# Patient Record
Sex: Male | Born: 1950 | Race: White | Hispanic: No | Marital: Married | Smoking: Former smoker
Health system: Southern US, Community
[De-identification: ages and names within clinical notes are randomized; demographics above are authoritative.]

## PROBLEM LIST (undated history)

## (undated) DIAGNOSIS — I10 Essential (primary) hypertension: Secondary | ICD-10-CM

## (undated) DIAGNOSIS — K219 Gastro-esophageal reflux disease without esophagitis: Secondary | ICD-10-CM

## (undated) DIAGNOSIS — Z973 Presence of spectacles and contact lenses: Secondary | ICD-10-CM

## (undated) DIAGNOSIS — C07 Malignant neoplasm of parotid gland: Secondary | ICD-10-CM

## (undated) DIAGNOSIS — C434 Malignant melanoma of scalp and neck: Secondary | ICD-10-CM

## (undated) HISTORY — PX: JOINT REPLACEMENT: SHX530

## (undated) HISTORY — PX: VARICOCELE EXCISION: SUR582

## (undated) HISTORY — PX: TONSILLECTOMY: SUR1361

## (undated) HISTORY — PX: BASAL CELL CARCINOMA EXCISION: SHX1214

## (undated) HISTORY — PX: APPENDECTOMY: SHX54

---

## 1998-06-24 ENCOUNTER — Emergency Department (HOSPITAL_COMMUNITY): Admission: EM | Admit: 1998-06-24 | Discharge: 1998-06-24 | Payer: Self-pay | Admitting: Emergency Medicine

## 1999-02-17 ENCOUNTER — Emergency Department (HOSPITAL_COMMUNITY): Admission: EM | Admit: 1999-02-17 | Discharge: 1999-02-17 | Payer: Self-pay | Admitting: Emergency Medicine

## 1999-05-24 ENCOUNTER — Ambulatory Visit (HOSPITAL_BASED_OUTPATIENT_CLINIC_OR_DEPARTMENT_OTHER): Admission: RE | Admit: 1999-05-24 | Discharge: 1999-05-24 | Payer: Self-pay | Admitting: *Deleted

## 2000-11-20 ENCOUNTER — Other Ambulatory Visit: Admission: RE | Admit: 2000-11-20 | Discharge: 2000-11-20 | Payer: Self-pay | Admitting: Family Medicine

## 2001-02-05 ENCOUNTER — Ambulatory Visit (HOSPITAL_BASED_OUTPATIENT_CLINIC_OR_DEPARTMENT_OTHER): Admission: RE | Admit: 2001-02-05 | Discharge: 2001-02-05 | Payer: Self-pay | Admitting: *Deleted

## 2001-02-05 ENCOUNTER — Encounter (INDEPENDENT_AMBULATORY_CARE_PROVIDER_SITE_OTHER): Payer: Self-pay | Admitting: Specialist

## 2002-08-17 ENCOUNTER — Ambulatory Visit (HOSPITAL_BASED_OUTPATIENT_CLINIC_OR_DEPARTMENT_OTHER): Admission: RE | Admit: 2002-08-17 | Discharge: 2002-08-17 | Payer: Self-pay | Admitting: Orthopedic Surgery

## 2004-07-11 ENCOUNTER — Emergency Department (HOSPITAL_COMMUNITY): Admission: EM | Admit: 2004-07-11 | Discharge: 2004-07-11 | Payer: Self-pay | Admitting: *Deleted

## 2004-07-30 ENCOUNTER — Ambulatory Visit (HOSPITAL_COMMUNITY): Admission: RE | Admit: 2004-07-30 | Discharge: 2004-07-30 | Payer: Self-pay | Admitting: Family Medicine

## 2004-11-20 ENCOUNTER — Encounter: Admission: RE | Admit: 2004-11-20 | Discharge: 2005-02-18 | Payer: Self-pay | Admitting: Family Medicine

## 2008-05-19 ENCOUNTER — Emergency Department (HOSPITAL_COMMUNITY): Admission: EM | Admit: 2008-05-19 | Discharge: 2008-05-20 | Payer: Self-pay | Admitting: Emergency Medicine

## 2010-03-15 ENCOUNTER — Encounter: Admission: RE | Admit: 2010-03-15 | Discharge: 2010-03-15 | Payer: Self-pay | Admitting: Neurological Surgery

## 2010-11-05 ENCOUNTER — Encounter (HOSPITAL_BASED_OUTPATIENT_CLINIC_OR_DEPARTMENT_OTHER)
Admission: RE | Admit: 2010-11-05 | Discharge: 2010-11-05 | Disposition: A | Discharge status: Home / Self Care | Payer: BC Managed Care – PPO | Source: Ambulatory Visit | Attending: Orthopedic Surgery | Admitting: Orthopedic Surgery

## 2010-11-05 DIAGNOSIS — M6530 Trigger finger, unspecified finger: Secondary | ICD-10-CM | POA: Insufficient documentation

## 2010-11-05 LAB — COMPREHENSIVE METABOLIC PANEL
ALT: 32 U/L (ref 0–53)
AST: 23 U/L (ref 0–37)
Albumin: 4.1 g/dL (ref 3.5–5.2)
Alkaline Phosphatase: 68 U/L (ref 39–117)
BUN: 27 mg/dL — ABNORMAL HIGH (ref 6–23)
CO2: 24 mEq/L (ref 19–32)
Calcium: 9.2 mg/dL (ref 8.4–10.5)
Chloride: 105 mEq/L (ref 96–112)
Creatinine, Ser: 0.92 mg/dL (ref 0.4–1.5)
GFR calc Af Amer: >60 mL/min (ref >60)
GFR calc non Af Amer: >60 mL/min (ref >60)
Glucose, Bld: 147 mg/dL — ABNORMAL HIGH (ref 70–99)
Potassium: 4.2 mEq/L (ref 3.5–5.1)
Sodium: 138 mEq/L (ref 135–145)
Total Bilirubin: 0.6 mg/dL (ref 0.3–1.2)
Total Protein: 7.2 g/dL (ref 6.0–8.3)

## 2010-11-05 LAB — LIPID PANEL
Cholesterol: 131 mg/dL (ref 0–200)
HDL: 34 mg/dL — ABNORMAL LOW (ref >39)
LDL Cholesterol: 81 mg/dL (ref 0–99)
Total CHOL/HDL Ratio: 3.9 RATIO
Triglycerides: 80 mg/dL (ref <150)
VLDL: 16 mg/dL (ref 0–40)

## 2010-11-05 LAB — HEMOGLOBIN A1C
Hgb A1c MFr Bld: 6.6 % — ABNORMAL HIGH (ref <5.7)
Mean Plasma Glucose: 143 mg/dL — ABNORMAL HIGH (ref <117)

## 2010-11-07 ENCOUNTER — Ambulatory Visit (HOSPITAL_BASED_OUTPATIENT_CLINIC_OR_DEPARTMENT_OTHER)
Admission: RE | Admit: 2010-11-07 | Discharge: 2010-11-07 | Disposition: A | Discharge status: Home / Self Care | Payer: BC Managed Care – PPO | Source: Ambulatory Visit | Attending: Orthopedic Surgery | Admitting: Orthopedic Surgery

## 2010-11-07 ENCOUNTER — Ambulatory Visit: Admission: RE | Admit: 2010-11-07 | Payer: Self-pay | Source: Home / Self Care | Admitting: Orthopedic Surgery

## 2010-11-07 DIAGNOSIS — Z01818 Encounter for other preprocedural examination: Secondary | ICD-10-CM | POA: Insufficient documentation

## 2010-11-07 DIAGNOSIS — Z01812 Encounter for preprocedural laboratory examination: Secondary | ICD-10-CM | POA: Insufficient documentation

## 2010-11-07 DIAGNOSIS — M6530 Trigger finger, unspecified finger: Secondary | ICD-10-CM | POA: Insufficient documentation

## 2010-11-07 LAB — POCT HEMOGLOBIN-HEMACUE: Hemoglobin: 14 g/dL (ref 13.0–17.0)

## 2010-11-07 LAB — GLUCOSE, CAPILLARY
Glucose-Capillary: 152 mg/dL — ABNORMAL HIGH (ref 70–99)
Glucose-Capillary: 121 mg/dL — ABNORMAL HIGH (ref 70–99)

## 2010-12-02 NOTE — Op Note (Signed)
Corey Sellers, Corey Sellers               ACCOUNT NO.:  [id number]  MEDICAL RECORD NO.:  [id number]           PATIENT TYPE:  LOCATION:                                 FACILITY:  PHYSICIAN:  Betha Loa, MD        DATE OF BIRTH:  08-25-1951  DATE OF PROCEDURE:  11/07/2010 DATE OF DISCHARGE:                              OPERATIVE REPORT   PREOPERATIVE DIAGNOSIS:  Right small finger trigger digit.  POSTOPERATIVE DIAGNOSIS:  Right small finger trigger digit.  PROCEDURE:  Right small finger trigger release.  SURGEON:  Betha Loa, MD  ASSISTANT:  None.  ANESTHESIA:  Local with sedation.  INTRAVENOUS FLUIDS:  Per anesthesia flow sheet.  ESTIMATED BLOOD LOSS:  Minimal.  COMPLICATIONS:  None.  SPECIMENS:  None.  TOURNIQUET TIME:  21 minutes.  DISPOSITION:  Stable to PACU.  INDICATIONS:  Corey Sellers is a 60 year old white male whom I have seen in the office for triggering of the right small finger.  We have injected it at least two times in the office.  He had continued discomfort with triggering of the finger.  It has improved, however, he still notes an occasional pop in the finger which is bothersome to him.  He also had some pain in the palm of the hand associated with the flexor tendon nodule.  He wished to have a trigger release to try and relieve some of this.  I discussed with him that the release would relieve the triggering but would not do anything to improve the pain with gripping right away until the nodule began to decrease in size.  He understood this and agreed.  Risks, benefits, and alternatives of surgery were discussed including the risk of blood loss, infection, damage to nerves, vessels, tendons, ligaments, bone, failure of the surgery, need for additional surgery, complications with wound healing, continued pain, continued triggering.  He voiced understanding of these risks and elected to proceed.  OPERATIVE COURSE:  After being identified preoperatively by  myself, the patient and I agreed upon the procedure and site of the procedure.  The surgery site was marked.  The risks, benefits, and alternatives of surgery were reviewed, and he wished to proceed.  Surgical consent had been signed.  He is given 1 g of IV Ancef as preoperative antibiotic prophylaxis.  He was transferred to the operating room, placed on the operating table in supine position with the right upper extremity on arm board.  Sedation was induced by the anesthesia staff.  Local anesthesia was given with a half-and-half solution of 0.25% plain Marcaine and 1% plain lidocaine.  An 8 mL total was used in the area of incision at the level of the distal palmar crease over the small finger.  The right upper extremity was then prepped and draped in normal sterile orthopedic fashion using ChloraPrep.  A surgical pause had been performed by the surgeons, Anesthesia, and operating room staff and all were in agreement with the patient, procedure, and site of the procedure.  Tourniquet at the proximal aspect of the extremity was inflated to 250 mmHg after exsanguination of the limb with  an Esmarch bandage.  Incision was made just distal to the distal palmar crease.  This was carried into the subcutaneous tissues by spreading technique.  Retractors were used to protect the radial and ulnar neurovascular bundles throughout the case. The flexor tendon sheath was easily identified.  The A1 pulley was identified.  There was no A0 pulley.  The A1 pulley was sharply incised using knife.  It was incised in its entirety.  The A2 pulley was left intact.  The FDP and FDS tendons were brought through the wound and separated.  There was noted to be no triggering with traction on the tendons.  The patient was lightened in his sedation.  The drape was dropped, and he was asked to make a tight fist and then slowly open it, no triggering was noted by myself or the patient.  The wound was then copiously  irrigated by sterile saline.  It was closed using 4-0 nylon in a horizontal mattress fashion.  The wound was then dressed with sterile Xeroform and 4x4s and wrapped with Kling and a Coban dressing lightly. The tourniquet was deflated at 21 minutes.  Fingertips were pink with brisk capillary refill after deflation of the tourniquet.  Operative drapes were broken down, and the patient was awoken from anesthesia safely.  He was transferred back to stretcher and taken to PACU in stable condition.  I will see him back in the office in 1 week for postoperative followup.  I will give him Percocet 5/325 one to two p.o. q.6 h. p.r.n. pain.     Betha Loa, MD     KK/MEDQ  D:  11/07/2010  T:  11/08/2010  Job:  [id number]  Electronically Signed by Betha Loa  on 12/02/2010 04:11:40 PM

## 2010-12-16 ENCOUNTER — Other Ambulatory Visit: Payer: Self-pay | Admitting: Family Medicine

## 2011-02-14 NOTE — Op Note (Signed)
[place]. [facility]  Patient:    Corey Sellers, Corey Sellers                        MRN: [id number] Proc. Date: 02/05/01 Attending:  Molly Maduro L. Lyman Bishop, M.D.                           Operative Report  PREOPERATIVE DIAGNOSIS:  Mass, right vocal cord.  POSTOPERATIVE DIAGNOSIS:  Mass, right vocal cord.  OPERATION PERFORMED:  Microlaryngoscopy with excision of mass, right vocal cord.  SURGEON:  Robert L. Lyman Bishop, M.D.  ANESTHESIA:  General.  INDICATIONS FOR PROCEDURE:  This patient initially presented with a history of having coughed up a mass and was seen in the office with what appeared to be a reddish, inflammatory mass on the anterior third of the right vocal cord.  The particle that he had coughed up was evaluated by pathology and showed a benign inflammatory lesion.  Treated conservatively with no improvement and persistence of the mass in the right vocal cord, so patient is admitted for removal of the lesion.  The patient is a nonsmoker.  DESCRIPTION OF PROCEDURE:   After satisfactory general endotracheal anesthesia had been induced, a protective guard was placed over the patients upper anterior teeth and the Dedo-Pilling laryngoscope was inserted into the larynx and suspended with the Lewy holder.  Examination showed a broadly based, firm reddish mass on the free margin of the anterior third, right cord.  Larynx otherwise entirely normal in appearance.  Grasping the lesion with cup forceps, it was incised with the microscissors preserving the underlying cord substance.  There was minimal bleeding and this controlled with topical epinephrine pack.  Photographs were taken of the lesion which were included in the chart.  Specimen sent to pathology.  Estimated blood loss was less than 1 cc.  The patient tolerated the procedure well, was awakened from anesthesia and taken to the recovery room in satisfactory condition. DD:  02/05/01 TD:  02/05/01 Job:  [id number]

## 2011-02-14 NOTE — Op Note (Signed)
NAME:  Corey Sellers, Corey Sellers                         ACCOUNT NO.:  [id number]   MEDICAL RECORD NO.:  [id number]                   PATIENT TYPE:  AMB   LOCATION:  DSC                                  FACILITY:  [facility]   PHYSICIAN:  John L. Rendall III, M.D.           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   OPERATION PERFORMED:  1. Arthroscopic glenohumeral debridement of partial thickness cuff tear and     degenerative labrum.  2. Arthroscopic subacromial decompression with acromioplasty and     coracoacromial ligament takedown.  3. Open distal clavicle resection.   SURGEON:  John L. Rendall, M.D.   ANESTHESIA:  General.   INDICATIONS FOR PROCEDURE:  Chronic shoulder pain resistant to conservative  measures with MRI positive for subacromial impingement with partial  thickness subscapularis degeneration and tendinitis but no obvious cuff tear  plus significant AC joint arthritis.   DESCRIPTION OF PROCEDURE:  Under general anesthesia, the patient was placed  in the left lateral decubitus position on the bean bag and the right  shoulder was prepared with DuraPrep and draped as a sterile field.  It was  suspended from a fishing pole shoulder holder in the forward flexed 35  degree and adducted 30 degree position.  After routine prep and drape,  anatomic markings were marked with a marking pen.  A posterior entry was  then made after probing with an 18 gauge needle.  Glenohumeral joint was  evaluated.  An anterior portal was then obtained by Wissinger rod technique.  Glenohumeral debridement of a degenerative labrum and partially frayed  subscapularis undersurface  and undersurface of supraspinatus was done.  Following this, attention was turned to the glenohumeral surfaces which show  very little degenerative change.  Attention was then turned to the bursa and  actual chondromalacia of the undersurface of the acromion was seen.  A very  thick bursa was encountered an anterior irrigating portal was used.  A  posterior viewing portal and a lateral working portal.  First a bursectomy  was carried out.  Following this, the architect wand was used for periosteal  resection over the acromion.  A 6 mm Linvatec bur was then used for  acromioplasty removing approximately 4 to 5 mm of the downward sloping  anterior acromion over to the [facility] joint.  The CA ligament was taken down.  Following this, minor smoothing was done with a bur.  Attention was then  turned to the [facility] joint.  Traction was let off to five pounds and 1-1/2 inch  incision was made dorsally over the [facility] joint.  By subperiosteal dissection,  the end of the clavicle  was exposed and two baby Hohmann's were placed  anteriorly and posteriorly.  An oscillating saw was then used to remove 1 cm  distal clavicle.  Bone wax was then placed over the bleeding end of the  bone.  Electrocautery was used on several small vessels.  Attention was then  turned to the undersurface of the acromion at the [facility] joint and roughness was  felt there.  A rasp was used to smooth that and once this was completed.  The periosteal sleeve was sutured with 0 Vicryl.  Subcu was  closed with 2-0 Vicryl and the skin with clips.  The arm was infiltrated  with Marcaine 0.5% with epinephrine and sterile dressing and arm sling were  applied.  The patient returned to recovery in good condition.  Will have him  return to the office for check up on Tuesday.  He was given Percocet for  pain.                                                 John L. Dorothyann Gibbs, M.D.    Renato Gails  D:  08/17/2002  T:  08/17/2002  Job:  [id number]

## 2011-09-30 HISTORY — PX: PAROTIDECTOMY W/ NECK DISSECTION TOTAL: SUR1004

## 2012-04-13 ENCOUNTER — Ambulatory Visit (HOSPITAL_BASED_OUTPATIENT_CLINIC_OR_DEPARTMENT_OTHER)
Admission: RE | Admit: 2012-04-13 | Discharge: 2012-04-13 | Disposition: A | Discharge status: Home / Self Care | Payer: BC Managed Care – PPO | Source: Ambulatory Visit | Attending: Family Medicine | Admitting: Family Medicine

## 2012-04-13 ENCOUNTER — Other Ambulatory Visit (HOSPITAL_BASED_OUTPATIENT_CLINIC_OR_DEPARTMENT_OTHER): Payer: Self-pay | Admitting: Family Medicine

## 2012-04-13 DIAGNOSIS — L989 Disorder of the skin and subcutaneous tissue, unspecified: Secondary | ICD-10-CM | POA: Insufficient documentation

## 2012-04-13 DIAGNOSIS — R599 Enlarged lymph nodes, unspecified: Secondary | ICD-10-CM | POA: Insufficient documentation

## 2012-04-13 DIAGNOSIS — R221 Localized swelling, mass and lump, neck: Secondary | ICD-10-CM | POA: Insufficient documentation

## 2012-04-13 DIAGNOSIS — M47812 Spondylosis without myelopathy or radiculopathy, cervical region: Secondary | ICD-10-CM | POA: Insufficient documentation

## 2012-04-13 DIAGNOSIS — R220 Localized swelling, mass and lump, head: Secondary | ICD-10-CM | POA: Insufficient documentation

## 2012-04-13 MED ORDER — IOHEXOL 300 MG/ML  SOLN
75.0000 mL | Freq: Once | INTRAMUSCULAR | Status: AC | PRN
  Administered 2012-04-13: 75 mL via INTRAVENOUS

## 2012-04-14 ENCOUNTER — Other Ambulatory Visit (HOSPITAL_BASED_OUTPATIENT_CLINIC_OR_DEPARTMENT_OTHER): Payer: BC Managed Care – PPO

## 2012-04-15 ENCOUNTER — Other Ambulatory Visit (HOSPITAL_COMMUNITY): Payer: Self-pay | Admitting: Family Medicine

## 2012-04-15 DIAGNOSIS — R220 Localized swelling, mass and lump, head: Secondary | ICD-10-CM

## 2012-04-16 ENCOUNTER — Other Ambulatory Visit: Payer: Self-pay | Admitting: Radiology

## 2012-04-19 ENCOUNTER — Encounter (HOSPITAL_COMMUNITY): Payer: Self-pay

## 2012-04-19 ENCOUNTER — Ambulatory Visit (HOSPITAL_COMMUNITY)
Admission: RE | Admit: 2012-04-19 | Discharge: 2012-04-19 | Disposition: A | Discharge status: Home / Self Care | Payer: BC Managed Care – PPO | Source: Ambulatory Visit | Attending: Family Medicine | Admitting: Family Medicine

## 2012-04-19 DIAGNOSIS — R599 Enlarged lymph nodes, unspecified: Secondary | ICD-10-CM | POA: Insufficient documentation

## 2012-04-19 DIAGNOSIS — K219 Gastro-esophageal reflux disease without esophagitis: Secondary | ICD-10-CM | POA: Insufficient documentation

## 2012-04-19 DIAGNOSIS — I10 Essential (primary) hypertension: Secondary | ICD-10-CM | POA: Insufficient documentation

## 2012-04-19 DIAGNOSIS — E119 Type 2 diabetes mellitus without complications: Secondary | ICD-10-CM | POA: Insufficient documentation

## 2012-04-19 DIAGNOSIS — Z85819 Personal history of malignant neoplasm of unspecified site of lip, oral cavity, and pharynx: Secondary | ICD-10-CM | POA: Insufficient documentation

## 2012-04-19 DIAGNOSIS — R220 Localized swelling, mass and lump, head: Secondary | ICD-10-CM

## 2012-04-19 HISTORY — DX: Essential (primary) hypertension: I10

## 2012-04-19 HISTORY — DX: Gastro-esophageal reflux disease without esophagitis: K21.9

## 2012-04-19 LAB — CBC
MCHC: 34.1 g/dL (ref 30.0–36.0)
Platelets: 143 10*3/uL — ABNORMAL LOW (ref 150–400)
RDW: 13.1 % (ref 11.5–15.5)
WBC: 4 10*3/uL (ref 4.0–10.5)

## 2012-04-19 LAB — PROTIME-INR: INR: 1.11 (ref 0.00–1.49)

## 2012-04-19 LAB — APTT: aPTT: 35 seconds (ref 24–37)

## 2012-04-19 MED ORDER — MIDAZOLAM HCL 2 MG/2ML IJ SOLN
INTRAMUSCULAR | Status: AC
  Filled 2012-04-19: qty 6

## 2012-04-19 MED ORDER — FENTANYL CITRATE 0.05 MG/ML IJ SOLN
INTRAMUSCULAR | Status: AC | PRN
  Administered 2012-04-19 (×2): 50 ug via INTRAVENOUS

## 2012-04-19 MED ORDER — MIDAZOLAM HCL 5 MG/5ML IJ SOLN
INTRAMUSCULAR | Status: AC | PRN
Start: 2012-04-19 — End: 2012-04-19
  Administered 2012-04-19: 2 mg via INTRAVENOUS

## 2012-04-19 MED ORDER — SODIUM CHLORIDE 0.9 % IV SOLN: Freq: Once | INTRAVENOUS | Status: DC

## 2012-04-19 MED ORDER — FENTANYL CITRATE 0.05 MG/ML IJ SOLN
INTRAMUSCULAR | Status: AC
  Filled 2012-04-19: qty 4

## 2012-04-19 NOTE — Procedures (Signed)
Technically successful US guided biopsy of enlarged left cervical lymph node.  No immediate complications.  

## 2012-04-19 NOTE — H&P (Signed)
Corey Sellers is an 61 y.o. male.   Chief Complaint: left mandibular lymph node enlargement x 2 weeks Has had basal cell removed from left chin previously Scheduled for LAN biopsy in [place] HPI: HTN; DM; GERD; hyperlipidemia  Past Medical History  Diagnosis Date  . Hypertension   . Diabetes mellitus   . GERD (gastroesophageal reflux disease)     Past Surgical History  Procedure Date  . Tonsillectomy   . Appendectomy   . Varicocele excision     left  . Joint replacement     Rt shoulder  . Basal cell carcinoma excision     left chin    History reviewed. No pertinent family history. Social History:  reports that he has quit smoking. He does not have any smokeless tobacco history on file. His alcohol and drug histories not on file.  Allergies: No Known Allergies   (Not in a hospital admission)  Results for orders placed during the hospital encounter of 04/19/12 (from the past 48 hour(s))  APTT     Status: Normal   Collection Time   04/19/12 12:40 PM      Component Value Range Comment   aPTT 35  24 - 37 seconds   CBC     Status: Abnormal   Collection Time   04/19/12 12:40 PM      Component Value Range Comment   WBC 4.0  4.0 - 10.5 K/uL    RBC 4.77  4.22 - 5.81 MIL/uL    Hemoglobin 14.0  13.0 - 17.0 g/dL    HCT 16.1  09.6 - 04.5 %    MCV 86.2  78.0 - 100.0 fL    MCH 29.4  26.0 - 34.0 pg    MCHC 34.1  30.0 - 36.0 g/dL    RDW 40.9  81.1 - 91.4 %    Platelets 143 (*) 150 - 400 K/uL   PROTIME-INR     Status: Normal   Collection Time   04/19/12 12:40 PM      Component Value Range Comment   Prothrombin Time 14.5  11.6 - 15.2 seconds    INR 1.11  0.00 - 1.49    No results found.  Review of Systems  Constitutional: Negative for fever.  HENT:       Left mandible pain  Cardiovascular: Negative for chest pain.  Gastrointestinal: Negative for nausea and vomiting.  Neurological: Negative for headaches.  Psychiatric/Behavioral: The patient is nervous/anxious.     Blood  pressure 122/72, pulse 58, temperature 97.4 F (36.3 C), temperature source Oral, resp. rate 18, height 5' 9.5" (1.765 m), weight 224 lb 6 oz (101.776 kg), SpO2 96.00%. Physical Exam  Constitutional: He is oriented to person, place, and time.  Neck:       Tongue blackish color  Cardiovascular: Normal rate, regular rhythm and normal heart sounds.   No murmur heard. Respiratory: Effort normal and breath sounds normal. He has no wheezes.  GI: Soft. Bowel sounds are normal. There is no tenderness.  Musculoskeletal: Normal range of motion.  Neurological: He is alert and oriented to person, place, and time.  Skin: Skin is warm and dry.  Psychiatric: He has a normal mood and affect. His behavior is normal. Judgment and thought content normal.     Assessment/Plan Mandibular LAN (L); x 2 weeks Previous basal cell removal - left chin Scheduled for biopsy today Pt aware of procedure benefits and risks and agreeable to proceed Consent in chart  Narek Kniss A 04/19/2012,  1:30 PM

## 2012-04-19 NOTE — ED Notes (Signed)
Transporter here to take pt to Short Stay to recover

## 2012-04-20 ENCOUNTER — Telehealth (HOSPITAL_COMMUNITY): Payer: Self-pay | Admitting: *Deleted

## 2012-04-20 NOTE — Telephone Encounter (Signed)
Post op phone call, pt doing well, no problems.  Seeing MD tomorrow.  Encouraged to call for any questions or problems.

## 2012-04-21 ENCOUNTER — Other Ambulatory Visit: Payer: Self-pay | Admitting: Otolaryngology

## 2012-04-21 DIAGNOSIS — C434 Malignant melanoma of scalp and neck: Secondary | ICD-10-CM

## 2012-04-21 LAB — GLUCOSE, CAPILLARY: Glucose-Capillary: 117 mg/dL — ABNORMAL HIGH (ref 70–99)

## 2012-04-22 ENCOUNTER — Other Ambulatory Visit (HOSPITAL_COMMUNITY): Payer: Self-pay | Admitting: Otolaryngology

## 2012-04-22 DIAGNOSIS — C434 Malignant melanoma of scalp and neck: Secondary | ICD-10-CM

## 2012-04-22 DIAGNOSIS — C439 Malignant melanoma of skin, unspecified: Secondary | ICD-10-CM

## 2012-04-27 ENCOUNTER — Encounter (HOSPITAL_COMMUNITY): Payer: Self-pay

## 2012-04-27 ENCOUNTER — Telehealth: Payer: Self-pay | Admitting: *Deleted

## 2012-04-27 ENCOUNTER — Encounter (HOSPITAL_COMMUNITY)
Admission: RE | Admit: 2012-04-27 | Discharge: 2012-04-27 | Disposition: A | Discharge status: Home / Self Care | Payer: BC Managed Care – PPO | Source: Ambulatory Visit | Attending: Otolaryngology | Admitting: Otolaryngology

## 2012-04-27 DIAGNOSIS — C439 Malignant melanoma of skin, unspecified: Secondary | ICD-10-CM | POA: Insufficient documentation

## 2012-04-27 DIAGNOSIS — R221 Localized swelling, mass and lump, neck: Secondary | ICD-10-CM | POA: Insufficient documentation

## 2012-04-27 DIAGNOSIS — E279 Disorder of adrenal gland, unspecified: Secondary | ICD-10-CM | POA: Insufficient documentation

## 2012-04-27 DIAGNOSIS — R220 Localized swelling, mass and lump, head: Secondary | ICD-10-CM | POA: Insufficient documentation

## 2012-04-27 HISTORY — DX: Malignant melanoma of scalp and neck: C43.4

## 2012-04-27 MED ORDER — FLUDEOXYGLUCOSE F - 18 (FDG) INJECTION
18.4000 | Freq: Once | INTRAVENOUS | Status: AC | PRN
  Administered 2012-04-27: 18.4 via INTRAVENOUS

## 2012-04-27 NOTE — Telephone Encounter (Signed)
Patient stated he will not be coming here he will be going to see Dr. Jonny Ruiz Salvage in [place] on 04-27-2012 at 3:00pm

## 2012-08-12 ENCOUNTER — Other Ambulatory Visit: Payer: Self-pay | Admitting: Otolaryngology

## 2013-05-17 ENCOUNTER — Encounter (HOSPITAL_BASED_OUTPATIENT_CLINIC_OR_DEPARTMENT_OTHER): Payer: Self-pay | Admitting: *Deleted

## 2013-05-17 ENCOUNTER — Encounter (HOSPITAL_BASED_OUTPATIENT_CLINIC_OR_DEPARTMENT_OTHER)
Admission: RE | Admit: 2013-05-17 | Discharge: 2013-05-17 | Disposition: A | Discharge status: Home / Self Care | Payer: BC Managed Care – PPO | Source: Ambulatory Visit | Attending: Orthopedic Surgery | Admitting: Orthopedic Surgery

## 2013-05-17 ENCOUNTER — Other Ambulatory Visit: Payer: Self-pay

## 2013-05-17 ENCOUNTER — Other Ambulatory Visit: Payer: Self-pay | Admitting: Orthopedic Surgery

## 2013-05-17 DIAGNOSIS — Z01810 Encounter for preprocedural cardiovascular examination: Secondary | ICD-10-CM | POA: Insufficient documentation

## 2013-05-17 DIAGNOSIS — Z01818 Encounter for other preprocedural examination: Secondary | ICD-10-CM | POA: Insufficient documentation

## 2013-05-17 NOTE — Progress Notes (Signed)
05/17/13 1018  OBSTRUCTIVE SLEEP APNEA  Have you ever been diagnosed with sleep apnea through a sleep study? No  Do you snore loudly (loud enough to be heard through closed doors)?  1  Do you often feel tired, fatigued, or sleepy during the daytime? 0  Has anyone observed you stop breathing during your sleep? 0  Do you have, or are you being treated for high blood pressure? 1  BMI more than 35 kg/m2? 0  Age over 61 years old? 1  Gender: 1  Obstructive Sleep Apnea Score 4  Score 4 or greater  Results sent to PCP   

## 2013-05-17 NOTE — Progress Notes (Signed)
05/17/13 1018  OBSTRUCTIVE SLEEP APNEA  Have you ever been diagnosed with sleep apnea through a sleep study? No  Do you snore loudly (loud enough to be heard through closed doors)?  1  Do you often feel tired, fatigued, or sleepy during the daytime? 0  Has anyone observed you stop breathing during your sleep? 0  Do you have, or are you being treated for high blood pressure? 1  BMI more than 35 kg/m2? 0  Age over 61 years old? 1  Gender: 1  Obstructive Sleep Apnea Score 4  Score 4 or greater  Results sent to PCP

## 2013-05-17 NOTE — Progress Notes (Signed)
Pt had a parotidectomy 2013 [facility]-will come in for new ekg-had labs pcp 05/11/13

## 2013-05-23 ENCOUNTER — Encounter (HOSPITAL_BASED_OUTPATIENT_CLINIC_OR_DEPARTMENT_OTHER): Payer: Self-pay | Admitting: Certified Registered Nurse Anesthetist

## 2013-05-23 ENCOUNTER — Ambulatory Visit (HOSPITAL_BASED_OUTPATIENT_CLINIC_OR_DEPARTMENT_OTHER)
Admission: RE | Admit: 2013-05-23 | Discharge: 2013-05-23 | Disposition: A | Discharge status: Home / Self Care | Payer: BC Managed Care – PPO | Source: Ambulatory Visit | Attending: Orthopedic Surgery | Admitting: Orthopedic Surgery

## 2013-05-23 ENCOUNTER — Ambulatory Visit (HOSPITAL_BASED_OUTPATIENT_CLINIC_OR_DEPARTMENT_OTHER): Payer: BC Managed Care – PPO | Admitting: Certified Registered Nurse Anesthetist

## 2013-05-23 ENCOUNTER — Encounter (HOSPITAL_BASED_OUTPATIENT_CLINIC_OR_DEPARTMENT_OTHER)
Admission: RE | Disposition: A | Discharge status: Home / Self Care | Payer: Self-pay | Source: Ambulatory Visit | Attending: Orthopedic Surgery

## 2013-05-23 DIAGNOSIS — M7130 Other bursal cyst, unspecified site: Secondary | ICD-10-CM | POA: Insufficient documentation

## 2013-05-23 DIAGNOSIS — M6530 Trigger finger, unspecified finger: Secondary | ICD-10-CM | POA: Insufficient documentation

## 2013-05-23 HISTORY — PX: EXCISION METACARPAL MASS: SHX6372

## 2013-05-23 HISTORY — DX: Malignant neoplasm of parotid gland: C07

## 2013-05-23 HISTORY — PX: TRIGGER FINGER RELEASE: SHX641

## 2013-05-23 HISTORY — DX: Presence of spectacles and contact lenses: Z97.3

## 2013-05-23 SURGERY — RELEASE, A1 PULLEY, FOR TRIGGER FINGER
Anesthesia: General | Site: Hand | Laterality: Right | Wound class: Clean

## 2013-05-23 MED ORDER — FENTANYL CITRATE 0.05 MG/ML IJ SOLN
INTRAMUSCULAR | Status: DC | PRN
  Administered 2013-05-23: 25 ug via INTRAVENOUS
  Administered 2013-05-23: 50 ug via INTRAVENOUS
  Administered 2013-05-23: 25 ug via INTRAVENOUS
  Administered 2013-05-23: 50 ug via INTRAVENOUS

## 2013-05-23 MED ORDER — OXYCODONE HCL 5 MG PO TABS: 5.0000 mg | ORAL_TABLET | Freq: Once | ORAL | Status: DC | PRN

## 2013-05-23 MED ORDER — FENTANYL CITRATE 0.05 MG/ML IJ SOLN
50.0000 ug | INTRAMUSCULAR | Status: DC | PRN
Start: 2013-05-23 — End: 2013-05-23

## 2013-05-23 MED ORDER — MIDAZOLAM HCL 5 MG/5ML IJ SOLN
INTRAMUSCULAR | Status: DC | PRN
  Administered 2013-05-23: 1 mg via INTRAVENOUS

## 2013-05-23 MED ORDER — CHLORHEXIDINE GLUCONATE 4 % EX LIQD: 60.0000 mL | Freq: Once | CUTANEOUS | Status: DC

## 2013-05-23 MED ORDER — LIDOCAINE HCL (CARDIAC) 20 MG/ML IV SOLN
INTRAVENOUS | Status: DC | PRN
  Administered 2013-05-23: 60 mg via INTRAVENOUS

## 2013-05-23 MED ORDER — HYDROCODONE-ACETAMINOPHEN 5-325 MG PO TABS: ORAL_TABLET | ORAL | Status: DC

## 2013-05-23 MED ORDER — CEFAZOLIN SODIUM-DEXTROSE 2-3 GM-% IV SOLR
2.0000 g | INTRAVENOUS | Status: AC
  Administered 2013-05-23: 2 g via INTRAVENOUS

## 2013-05-23 MED ORDER — MIDAZOLAM HCL 2 MG/2ML IJ SOLN: 1.0000 mg | INTRAMUSCULAR | Status: DC | PRN

## 2013-05-23 MED ORDER — OXYCODONE HCL 5 MG/5ML PO SOLN: 5.0000 mg | Freq: Once | ORAL | Status: DC | PRN

## 2013-05-23 MED ORDER — HYDROMORPHONE HCL PF 1 MG/ML IJ SOLN: 0.2500 mg | INTRAMUSCULAR | Status: DC | PRN

## 2013-05-23 MED ORDER — PROPOFOL 10 MG/ML IV BOLUS
INTRAVENOUS | Status: DC | PRN
  Administered 2013-05-23: 200 mg via INTRAVENOUS

## 2013-05-23 MED ORDER — BUPIVACAINE HCL (PF) 0.25 % IJ SOLN
INTRAMUSCULAR | Status: DC | PRN
  Administered 2013-05-23: 10 mL

## 2013-05-23 MED ORDER — LACTATED RINGERS IV SOLN
INTRAVENOUS | Status: DC
  Administered 2013-05-23 (×2): via INTRAVENOUS

## 2013-05-23 MED ORDER — DEXAMETHASONE SODIUM PHOSPHATE 10 MG/ML IJ SOLN
INTRAMUSCULAR | Status: DC | PRN
  Administered 2013-05-23: 4 mg via INTRAVENOUS

## 2013-05-23 MED ORDER — ONDANSETRON HCL 4 MG/2ML IJ SOLN
INTRAMUSCULAR | Status: DC | PRN
  Administered 2013-05-23: 4 mg via INTRAVENOUS

## 2013-05-23 SURGICAL SUPPLY — 49 items
BANDAGE COBAN STERILE 2 (GAUZE/BANDAGES/DRESSINGS) ×2 IMPLANT
BANDAGE CONFORM 2  STR LF (GAUZE/BANDAGES/DRESSINGS) IMPLANT
BANDAGE ELASTIC 3 VELCRO ST LF (GAUZE/BANDAGES/DRESSINGS) IMPLANT
BANDAGE GAUZE ELAST BULKY 4 IN (GAUZE/BANDAGES/DRESSINGS) IMPLANT
BANDAGE GAUZE STRT 1 STR LF (GAUZE/BANDAGES/DRESSINGS) IMPLANT
BENZOIN TINCTURE PRP APPL 2/3 (GAUZE/BANDAGES/DRESSINGS) IMPLANT
BLADE MINI RND TIP GREEN BEAV (BLADE) IMPLANT
BLADE SURG 15 STRL LF DISP TIS (BLADE) ×2 IMPLANT
BLADE SURG 15 STRL SS (BLADE) ×2
BNDG COHESIVE 1X5 TAN STRL LF (GAUZE/BANDAGES/DRESSINGS) IMPLANT
BNDG ELASTIC 2 VLCR STRL LF (GAUZE/BANDAGES/DRESSINGS) IMPLANT
BNDG ESMARK 4X9 LF (GAUZE/BANDAGES/DRESSINGS) ×2 IMPLANT
BNDG PLASTER X FAST 3X3 WHT LF (CAST SUPPLIES) IMPLANT
CHLORAPREP W/TINT 26ML (MISCELLANEOUS) ×2 IMPLANT
CLOTH BEACON ORANGE TIMEOUT ST (SAFETY) ×2 IMPLANT
CORDS BIPOLAR (ELECTRODE) ×2 IMPLANT
COVER MAYO STAND STRL (DRAPES) ×2 IMPLANT
COVER TABLE BACK 60X90 (DRAPES) ×2 IMPLANT
CUFF TOURNIQUET SINGLE 18IN (TOURNIQUET CUFF) ×2 IMPLANT
DRAPE EXTREMITY T 121X128X90 (DRAPE) ×2 IMPLANT
DRAPE SURG 17X23 STRL (DRAPES) ×2 IMPLANT
GAUZE XEROFORM 1X8 LF (GAUZE/BANDAGES/DRESSINGS) ×2 IMPLANT
GLOVE BIO SURGEON STRL SZ7.5 (GLOVE) ×2 IMPLANT
GLOVE BIOGEL PI IND STRL 7.0 (GLOVE) ×1 IMPLANT
GLOVE BIOGEL PI IND STRL 8 (GLOVE) ×1 IMPLANT
GLOVE BIOGEL PI INDICATOR 7.0 (GLOVE) ×1
GLOVE BIOGEL PI INDICATOR 8 (GLOVE) ×1
GLOVE ECLIPSE 6.5 STRL STRAW (GLOVE) ×2 IMPLANT
GLOVE EXAM NITRILE LRG STRL (GLOVE) ×2 IMPLANT
GOWN BRE IMP PREV XXLGXLNG (GOWN DISPOSABLE) ×2 IMPLANT
GOWN PREVENTION PLUS XLARGE (GOWN DISPOSABLE) ×2 IMPLANT
NEEDLE HYPO 25X1 1.5 SAFETY (NEEDLE) ×2 IMPLANT
NS IRRIG 1000ML POUR BTL (IV SOLUTION) ×2 IMPLANT
PACK BASIN DAY SURGERY FS (CUSTOM PROCEDURE TRAY) ×2 IMPLANT
PAD CAST 3X4 CTTN HI CHSV (CAST SUPPLIES) IMPLANT
PAD CAST 4YDX4 CTTN HI CHSV (CAST SUPPLIES) IMPLANT
PADDING CAST ABS 4INX4YD NS (CAST SUPPLIES)
PADDING CAST ABS COTTON 4X4 ST (CAST SUPPLIES) IMPLANT
PADDING CAST COTTON 3X4 STRL (CAST SUPPLIES)
PADDING CAST COTTON 4X4 STRL (CAST SUPPLIES)
SPONGE GAUZE 4X4 12PLY (GAUZE/BANDAGES/DRESSINGS) ×2 IMPLANT
STOCKINETTE 4X48 STRL (DRAPES) ×2 IMPLANT
STRIP CLOSURE SKIN 1/2X4 (GAUZE/BANDAGES/DRESSINGS) IMPLANT
SUT ETHILON 3 0 PS 1 (SUTURE) IMPLANT
SUT ETHILON 4 0 PS 2 18 (SUTURE) ×2 IMPLANT
SYR BULB 3OZ (MISCELLANEOUS) ×2 IMPLANT
SYR CONTROL 10ML LL (SYRINGE) ×2 IMPLANT
TOWEL OR 17X24 6PK STRL BLUE (TOWEL DISPOSABLE) ×2 IMPLANT
UNDERPAD 30X30 INCONTINENT (UNDERPADS AND DIAPERS) IMPLANT

## 2013-05-23 NOTE — Anesthesia Preprocedure Evaluation (Addendum)
Anesthesia Evaluation  Patient identified by MRN, date of birth, ID band Patient awake    Reviewed: Allergy & Precautions, H&P , NPO status , Patient's Chart, lab work & pertinent test results  Airway Mallampati: II TM Distance: >3 FB Neck ROM: Full    Dental no notable dental hx. (+) Teeth Intact and Dental Advisory Given   Pulmonary neg pulmonary ROS,  breath sounds clear to auscultation  Pulmonary exam normal       Cardiovascular hypertension, Pt. on medications Rhythm:Regular Rate:Normal     Neuro/Psych negative neurological ROS  negative psych ROS   GI/Hepatic Neg liver ROS, GERD-  Medicated and Controlled,  Endo/Other  diabetes, Type 2, Oral Hypoglycemic Agents  Renal/GU negative Renal ROS  negative genitourinary   Musculoskeletal   Abdominal   Peds  Hematology negative hematology ROS (+)   Anesthesia Other Findings   Reproductive/Obstetrics negative OB ROS                          Anesthesia Physical Anesthesia Plan  ASA: II  Anesthesia Plan: General   Post-op Pain Management:    Induction: Intravenous  Airway Management Planned: LMA  Additional Equipment:   Intra-op Plan:   Post-operative Plan: Extubation in OR  Informed Consent: I have reviewed the patients History and Physical, chart, labs and discussed the procedure including the risks, benefits and alternatives for the proposed anesthesia with the patient or authorized representative who has indicated his/her understanding and acceptance.   Dental advisory given  Plan Discussed with: CRNA  Anesthesia Plan Comments:         Anesthesia Quick Evaluation

## 2013-05-23 NOTE — Brief Op Note (Signed)
05/23/2013  9:21 AM  PATIENT:  Corey Sellers  62 y.o. male  PRE-OPERATIVE DIAGNOSIS:  RIGHT INDEX/LONG TRIGGER DIGIT LONG ANNULAR LIGAMENT CYST  POST-OPERATIVE DIAGNOSIS:  RIGHT INDEX/LONG TRIGGER DIGIT LONG ANNULAR LIGAMENT CYST  PROCEDURE:  Procedure(s): RELEASE TRIGGER FINGER/A-1 PULLEY RIGHT INDEX AND LONG RIGHT LONG EXCISION ANNULAR LIGAMENT CYST  SURGEON:  Surgeon(s): Tami Ribas, MD  PHYSICIAN ASSISTANT:   ASSISTANTS: none   ANESTHESIA:   general  EBL:  Total I/O In: 1000 [I.V.:1000] Out: -   DRAINS: none   LOCAL MEDICATIONS USED:  MARCAINE     SPECIMEN:  Source of Specimen:  right long finger  DISPOSITION OF SPECIMEN:  PATHOLOGY  COUNTS:  YES  TOURNIQUET:   Total Tourniquet Time Documented: Upper Arm (Right) - 29 minutes Total: Upper Arm (Right) - 29 minutes   DICTATION: .Other Dictation: Dictation Number [id number]  PLAN OF CARE: Discharge to home after PACU

## 2013-05-23 NOTE — Op Note (Signed)
NAMEHRIDAY, STAI               ACCOUNT NO.:  [id number]  MEDICAL RECORD NO.:  [id number]  LOCATION:                                 FACILITY:  PHYSICIAN:  Betha Loa, MD             DATE OF BIRTH:  DATE OF PROCEDURE:  05/23/2013 DATE OF DISCHARGE:                              OPERATIVE REPORT   PREOPERATIVE DIAGNOSIS:  Right index and long finger trigger digit and long finger annular ligament cyst.  POSTOPERATIVE DIAGNOSIS:  Right index and long finger trigger digit and long finger annular ligament cyst.  PROCEDURE:   1. Right long finger annular ligament cyst excision 2. Right long finger trigger release 3. Right index finger trigger release  SURGEON:  Betha Loa, MD.  ASSISTANT:  None.  ANESTHESIA:  General.  IV FLUIDS:  Per anesthesia flow sheet.  ESTIMATED BLOOD LOSS:  Minimal.  COMPLICATIONS:  None.  SPECIMENS:  Specimens right long finger mass to pathology.  TOURNIQUET TIME:  29 minutes.  DISPOSITION:  Stable to PACU.  INDICATIONS:  Mr. Corey Sellers is a 62 year old male who has had triggering of the right index and long fingers.  Previously had these injected twice with recurrence.  He also notes a small mass in the long finger.  He wishes to have the triggers release the mass excised.  Risks, benefits, and alternatives of surgery were discussed including risk of blood loss, infection, damage to nerves, vessels, tendons, ligaments, bone; failure of surgery; need for additional surgery, complications with wound healing, continued pain, and recurrence of triggering recurrence of mass.  He voiced understanding of these risk and wishes to proceed.  OPERATIVE COURSE:  After being identified preoperatively by myself, the patient and I agreed upon procedure and site procedure.  Surgical site was marked.  The risks, benefits, and alternatives of surgery were reviewed and he wished to proceed.  Surgical consent had been signed. He was given IV Ancef as  preoperative antibiotic prophylaxis.  He was transported to the operating room and placed on the operating room table in supine position with the right upper extremity on arm board.  General anesthesia was induced by anesthesiologist.  The right upper extremity was prepped and draped in normal sterile orthopedic fashion.  Surgical pause was performed between surgeons, anesthesia, operating staff, and all were in agreement to the patient procedure and site of the procedure.  Tourniquet at the proximal aspect of the extremity was inflated to 250 mmHg after exsanguination of the limb with Esmarch bandage.  The index finger was addressed first.  An incision was made at the volar aspect of the MP joint.  This carried into subcutaneous tissues by spreading technique.  The digital nerve and artery were identified and protected throughout the case.  The A1 pulley was identified and incised sharply.  There was an 0-pulley as well.  This was also released.  There was significant soft tissue adhesion.  The proximal 1-2 mm of the A2 pulley was divided to eliminate a tight edge. The tendons were expressed through the wound.  They were adherent to each other and this was released.  Attention was turned to the long  finger.  A Brunner type incision was made.  This was carried into subcutaneous tissues by spreading technique.  The radial digital nerve and artery were identified and protected throughout the case.  The ulnar digital nerve and artery were protected.  The A1 pulley was identified and incised.  Again, there was significant soft tissue adhesion.  The proximal 1-2 mm of the A2 pulley was divided to prevent a tight edge. The mass in the volar aspect of the finger was identified.  It was at the distal third of the A2 pulley.  It was purplish in coloration.  It was freed up of soft tissue adherence.  It was coming up through the pulley.  It was excised along with a small window of the A2 pulley  and sent to Pathology for examination.  The tendons were expressed through the wound and any adhesions between them divided.  The fingers were placed through range of motion.  There was no triggering or catching. The wounds were copiously irrigated with sterile saline.  They were closed with 4-0 nylon in a horizontal mattress fashion.  They were then injected with 10 mL of 0.25% plain Marcaine to aid in postoperative analgesia.  The wounds were dressed with sterile Xeroform, 4x4s, and wrapped with Coban dressing lightly.  Tourniquet was deflated at 29 minutes.  Fingertips were pink with brisk capillary refill after deflation tourniquet.  The operative drapes were broken down and the patient was awoken from anesthesia safely.  He was transferred back to stretcher and taken to PACU in stable condition.  I will see him back in the office in 1 week for postoperative followup.  We will give him Norco 5/325, 1-2 p.o. q.6 hours p.r.n. pain, dispensed #30.     Betha Loa, MD     KK/MEDQ  D:  05/23/2013  T:  05/23/2013  Job:  [id number]

## 2013-05-23 NOTE — Anesthesia Postprocedure Evaluation (Signed)
  Anesthesia Post-op Note  Patient: Corey Sellers  Procedure(s) Performed: Procedure(s): RELEASE TRIGGER FINGER/A-1 PULLEY RIGHT INDEX AND LONG (Right) RIGHT LONG EXCISION ANNULAR LIGAMENT CYST (Right)  Patient Location: PACU  Anesthesia Type:General  Level of Consciousness: awake and alert   Airway and Oxygen Therapy: Patient Spontanous Breathing  Post-op Pain: mild  Post-op Assessment: Post-op Vital signs reviewed, Patient's Cardiovascular Status Stable, Respiratory Function Stable, Patent Airway and No signs of Nausea or vomiting  Post-op Vital Signs: Reviewed and stable  Complications: No apparent anesthesia complications

## 2013-05-23 NOTE — H&P (Signed)
Corey Sellers is an 62 y.o. male.   Chief Complaint: right index and long trigger and long mass HPI: 62 yo male with triggering of right index and long fingers.  These have been injected twice without lasting relief of triggering.  He wishes to have trigger release.  He has also noted a small mass in the long finger that he would like to have removed.    Past Medical History  Diagnosis Date  . Hypertension   . Diabetes mellitus   . GERD (gastroesophageal reflux disease)   . Wears glasses   . Melanoma of neck   . Parotid gland adenocarcinoma     lt -2013-[facility]    Past Surgical History  Procedure Laterality Date  . Tonsillectomy    . Appendectomy    . Varicocele excision      left  . Basal cell carcinoma excision      left chin  . Joint replacement  [id number]    Rt shoulder  . Parotidectomy w/ neck dissection total  2013    left parotidectomy-26 nodes removed    History reviewed. No pertinent family history. Social History:  reports that he quit smoking about 14 years ago. He does not have any smokeless tobacco history on file. He reports that  drinks alcohol. He reports that he does not use illicit drugs.  Allergies: No Known Allergies  Medications Prior to Admission  Medication Sig Dispense Refill  . aspirin EC 81 MG tablet Take 81 mg by mouth every evening.      . etodolac (LODINE) 400 MG tablet Take 400 mg by mouth 2 (two) times daily.      . Multiple Vitamin (MULTIVITAMIN WITH MINERALS) TABS Take 1 tablet by mouth daily.      . Olmesartan-Amlodipine-HCTZ (TRIBENZOR) 40-10-25 MG TABS Take 1 tablet by mouth every evening.      Marland Kitchen omeprazole (PRILOSEC) 20 MG capsule Take 20 mg by mouth daily.      . rosuvastatin (CRESTOR) 40 MG tablet Take 20 mg by mouth every evening.        No results found for this or any previous visit (from the past 48 hour(s)).  No results found.   A comprehensive review of systems was negative except for: Eyes: positive for  contacts/glasses Ears, nose, mouth, throat, and face: positive for tinnitus Respiratory: positive for cough and pneumonia  Blood pressure 128/78, pulse 76, temperature 97.7 F (36.5 C), temperature source Oral, height 5\' 10"  (1.778 m), weight 225 lb (102.059 kg), SpO2 96.00%.  General appearance: alert, cooperative and appears stated age Head: Normocephalic, without obvious abnormality, atraumatic Neck: supple, symmetrical, trachea midline Resp: clear to auscultation bilaterally Cardio: regular rate and rhythm GI: non tender Extremities: intact sensation and capillary refill all digits.  +epl/fpl/io.  tender and palpable flexor tendon nodules.  palpable annular ligament cyst. Pulses: 2+ and symmetric Skin: Skin color, texture, turgor normal. No rashes or lesions Neurologic: Grossly normal Incision/Wound: na  Assessment/Plan Right index and long finger trigger digits and long finger annular ligament cyst.  Non operative and operative treatment options were discussed with the patient and patient wishes to proceed with operative treatment. Risks, benefits, and alternatives of surgery were discussed and the patient agrees with the plan of care.   Navil Kole R 05/23/2013, 8:21 AM

## 2013-05-23 NOTE — Anesthesia Procedure Notes (Signed)
Procedure Name: LMA Insertion Date/Time: 05/23/2013 8:34 AM Performed by: Yeva Bissette D Pre-anesthesia Checklist: Patient identified, Emergency Drugs available, Suction available and Patient being monitored Patient Re-evaluated:Patient Re-evaluated prior to inductionOxygen Delivery Method: Circle System Utilized Preoxygenation: Pre-oxygenation with 100% oxygen Intubation Type: IV induction Ventilation: Mask ventilation without difficulty LMA: LMA inserted LMA Size: 5.0 Number of attempts: 1 Airway Equipment and Method: bite block Placement Confirmation: positive ETCO2 Tube secured with: Tape Dental Injury: Teeth and Oropharynx as per pre-operative assessment

## 2013-05-23 NOTE — Op Note (Signed)
[id number] 

## 2013-05-23 NOTE — Transfer of Care (Signed)
Immediate Anesthesia Transfer of Care Note  Patient: Corey Sellers  Procedure(s) Performed: Procedure(s): RELEASE TRIGGER FINGER/A-1 PULLEY RIGHT INDEX AND LONG (Right) RIGHT LONG EXCISION ANNULAR LIGAMENT CYST (Right)  Patient Location: PACU  Anesthesia Type:General  Level of Consciousness: awake and patient cooperative  Airway & Oxygen Therapy: Patient Spontanous Breathing and Patient connected to face mask oxygen  Post-op Assessment: Report given to PACU RN and Post -op Vital signs reviewed and stable  Post vital signs: Reviewed and stable  Complications: No apparent anesthesia complications

## 2013-05-24 ENCOUNTER — Encounter (HOSPITAL_BASED_OUTPATIENT_CLINIC_OR_DEPARTMENT_OTHER): Payer: Self-pay | Admitting: Orthopedic Surgery

## 2013-05-24 LAB — POCT HEMOGLOBIN-HEMACUE: Hemoglobin: 13.9 g/dL (ref 13.0–17.0)

## 2013-05-25 ENCOUNTER — Other Ambulatory Visit: Payer: Self-pay | Admitting: Orthopedic Surgery

## 2013-05-26 LAB — POCT I-STAT, CHEM 8
Chloride: 107 mEq/L (ref 96–112)
Glucose, Bld: 93 mg/dL (ref 70–99)
HCT: 45 % (ref 39.0–52.0)
Hemoglobin: 15.3 g/dL (ref 13.0–17.0)
Potassium: 4.6 mEq/L (ref 3.5–5.1)
Sodium: 139 mEq/L (ref 135–145)

## 2013-06-01 ENCOUNTER — Encounter (HOSPITAL_BASED_OUTPATIENT_CLINIC_OR_DEPARTMENT_OTHER): Payer: Self-pay | Admitting: *Deleted

## 2013-06-06 ENCOUNTER — Encounter (HOSPITAL_BASED_OUTPATIENT_CLINIC_OR_DEPARTMENT_OTHER)
Admission: RE | Disposition: A | Discharge status: Home / Self Care | Payer: Self-pay | Source: Ambulatory Visit | Attending: Orthopedic Surgery

## 2013-06-06 ENCOUNTER — Encounter (HOSPITAL_BASED_OUTPATIENT_CLINIC_OR_DEPARTMENT_OTHER): Payer: Self-pay | Admitting: *Deleted

## 2013-06-06 ENCOUNTER — Ambulatory Visit (HOSPITAL_BASED_OUTPATIENT_CLINIC_OR_DEPARTMENT_OTHER)
Admission: RE | Admit: 2013-06-06 | Discharge: 2013-06-06 | Disposition: A | Discharge status: Home / Self Care | Payer: BC Managed Care – PPO | Source: Ambulatory Visit | Attending: Orthopedic Surgery | Admitting: Orthopedic Surgery

## 2013-06-06 ENCOUNTER — Ambulatory Visit (HOSPITAL_BASED_OUTPATIENT_CLINIC_OR_DEPARTMENT_OTHER): Payer: BC Managed Care – PPO | Admitting: Anesthesiology

## 2013-06-06 ENCOUNTER — Encounter (HOSPITAL_BASED_OUTPATIENT_CLINIC_OR_DEPARTMENT_OTHER): Payer: Self-pay | Admitting: Anesthesiology

## 2013-06-06 DIAGNOSIS — M6530 Trigger finger, unspecified finger: Secondary | ICD-10-CM | POA: Insufficient documentation

## 2013-06-06 DIAGNOSIS — Z85819 Personal history of malignant neoplasm of unspecified site of lip, oral cavity, and pharynx: Secondary | ICD-10-CM | POA: Insufficient documentation

## 2013-06-06 DIAGNOSIS — Z85820 Personal history of malignant melanoma of skin: Secondary | ICD-10-CM | POA: Insufficient documentation

## 2013-06-06 DIAGNOSIS — M6740 Ganglion, unspecified site: Secondary | ICD-10-CM | POA: Insufficient documentation

## 2013-06-06 DIAGNOSIS — I10 Essential (primary) hypertension: Secondary | ICD-10-CM | POA: Insufficient documentation

## 2013-06-06 DIAGNOSIS — K219 Gastro-esophageal reflux disease without esophagitis: Secondary | ICD-10-CM | POA: Insufficient documentation

## 2013-06-06 DIAGNOSIS — E119 Type 2 diabetes mellitus without complications: Secondary | ICD-10-CM | POA: Insufficient documentation

## 2013-06-06 HISTORY — PX: TRIGGER FINGER RELEASE: SHX641

## 2013-06-06 HISTORY — PX: GANGLION CYST EXCISION: SHX1691

## 2013-06-06 SURGERY — RELEASE, A1 PULLEY, FOR TRIGGER FINGER
Anesthesia: General | Site: Wrist | Laterality: Left | Wound class: Clean

## 2013-06-06 MED ORDER — HYDROMORPHONE HCL PF 1 MG/ML IJ SOLN: 0.2500 mg | INTRAMUSCULAR | Status: DC | PRN

## 2013-06-06 MED ORDER — CEFAZOLIN SODIUM-DEXTROSE 2-3 GM-% IV SOLR
2.0000 g | INTRAVENOUS | Status: AC
  Administered 2013-06-06: 2 g via INTRAVENOUS

## 2013-06-06 MED ORDER — HYDROCODONE-ACETAMINOPHEN 5-325 MG PO TABS: ORAL_TABLET | ORAL | Status: DC

## 2013-06-06 MED ORDER — OXYCODONE HCL 5 MG PO TABS: 5.0000 mg | ORAL_TABLET | Freq: Once | ORAL | Status: DC | PRN

## 2013-06-06 MED ORDER — PROPOFOL 10 MG/ML IV BOLUS
INTRAVENOUS | Status: DC | PRN
  Administered 2013-06-06: 200 mg via INTRAVENOUS

## 2013-06-06 MED ORDER — OXYCODONE HCL 5 MG/5ML PO SOLN: 5.0000 mg | Freq: Once | ORAL | Status: DC | PRN

## 2013-06-06 MED ORDER — CHLORHEXIDINE GLUCONATE 4 % EX LIQD: 60.0000 mL | Freq: Once | CUTANEOUS | Status: DC

## 2013-06-06 MED ORDER — FENTANYL CITRATE 0.05 MG/ML IJ SOLN: 50.0000 ug | INTRAMUSCULAR | Status: DC | PRN

## 2013-06-06 MED ORDER — MIDAZOLAM HCL 2 MG/2ML IJ SOLN: 1.0000 mg | INTRAMUSCULAR | Status: DC | PRN

## 2013-06-06 MED ORDER — BUPIVACAINE HCL (PF) 0.25 % IJ SOLN
INTRAMUSCULAR | Status: DC | PRN
  Administered 2013-06-06: 8 mL

## 2013-06-06 MED ORDER — DEXAMETHASONE SODIUM PHOSPHATE 4 MG/ML IJ SOLN
INTRAMUSCULAR | Status: DC | PRN
  Administered 2013-06-06: 5 mg via INTRAVENOUS

## 2013-06-06 MED ORDER — LIDOCAINE HCL (CARDIAC) 20 MG/ML IV SOLN
INTRAVENOUS | Status: DC | PRN
  Administered 2013-06-06: 100 mg via INTRAVENOUS

## 2013-06-06 MED ORDER — ONDANSETRON HCL 4 MG/2ML IJ SOLN: 4.0000 mg | Freq: Once | INTRAMUSCULAR | Status: DC | PRN

## 2013-06-06 MED ORDER — MIDAZOLAM HCL 5 MG/5ML IJ SOLN
INTRAMUSCULAR | Status: DC | PRN
  Administered 2013-06-06: 2 mg via INTRAVENOUS

## 2013-06-06 MED ORDER — LACTATED RINGERS IV SOLN
INTRAVENOUS | Status: DC
  Administered 2013-06-06 (×3): via INTRAVENOUS

## 2013-06-06 MED ORDER — FENTANYL CITRATE 0.05 MG/ML IJ SOLN
INTRAMUSCULAR | Status: DC | PRN
  Administered 2013-06-06: 100 ug via INTRAVENOUS

## 2013-06-06 SURGICAL SUPPLY — 45 items
BANDAGE COBAN STERILE 2 (GAUZE/BANDAGES/DRESSINGS) IMPLANT
BANDAGE CONFORM 2  STR LF (GAUZE/BANDAGES/DRESSINGS) IMPLANT
BANDAGE ELASTIC 3 VELCRO ST LF (GAUZE/BANDAGES/DRESSINGS) ×3 IMPLANT
BANDAGE GAUZE ELAST BULKY 4 IN (GAUZE/BANDAGES/DRESSINGS) ×3 IMPLANT
BENZOIN TINCTURE PRP APPL 2/3 (GAUZE/BANDAGES/DRESSINGS) IMPLANT
BLADE MINI RND TIP GREEN BEAV (BLADE) IMPLANT
BLADE SURG 15 STRL LF DISP TIS (BLADE) ×4 IMPLANT
BLADE SURG 15 STRL SS (BLADE) ×2
BNDG ELASTIC 2 VLCR STRL LF (GAUZE/BANDAGES/DRESSINGS) IMPLANT
BNDG ESMARK 4X9 LF (GAUZE/BANDAGES/DRESSINGS) ×3 IMPLANT
CHLORAPREP W/TINT 26ML (MISCELLANEOUS) ×3 IMPLANT
CLOTH BEACON ORANGE TIMEOUT ST (SAFETY) ×3 IMPLANT
CORDS BIPOLAR (ELECTRODE) ×3 IMPLANT
COVER MAYO STAND STRL (DRAPES) ×3 IMPLANT
COVER TABLE BACK 60X90 (DRAPES) ×3 IMPLANT
CUFF TOURNIQUET SINGLE 18IN (TOURNIQUET CUFF) ×3 IMPLANT
DRAPE EXTREMITY T 121X128X90 (DRAPE) ×3 IMPLANT
DRAPE SURG 17X23 STRL (DRAPES) ×3 IMPLANT
DRSG PAD ABDOMINAL 8X10 ST (GAUZE/BANDAGES/DRESSINGS) IMPLANT
GAUZE XEROFORM 1X8 LF (GAUZE/BANDAGES/DRESSINGS) ×3 IMPLANT
GLOVE BIO SURGEON STRL SZ7.5 (GLOVE) ×3 IMPLANT
GLOVE BIOGEL PI IND STRL 8 (GLOVE) ×2 IMPLANT
GLOVE BIOGEL PI INDICATOR 8 (GLOVE) ×1
GOWN BRE IMP PREV XXLGXLNG (GOWN DISPOSABLE) ×3 IMPLANT
GOWN PREVENTION PLUS XLARGE (GOWN DISPOSABLE) ×3 IMPLANT
GOWN PREVENTION PLUS XXLARGE (GOWN DISPOSABLE) ×3 IMPLANT
NEEDLE HYPO 25X1 1.5 SAFETY (NEEDLE) ×3 IMPLANT
NS IRRIG 1000ML POUR BTL (IV SOLUTION) ×3 IMPLANT
PACK BASIN DAY SURGERY FS (CUSTOM PROCEDURE TRAY) ×3 IMPLANT
PAD CAST 3X4 CTTN HI CHSV (CAST SUPPLIES) IMPLANT
PADDING CAST ABS 4INX4YD NS (CAST SUPPLIES)
PADDING CAST ABS COTTON 4X4 ST (CAST SUPPLIES) IMPLANT
PADDING CAST COTTON 3X4 STRL (CAST SUPPLIES)
SPLINT PLASTER CAST XFAST 3X15 (CAST SUPPLIES) IMPLANT
SPLINT PLASTER XTRA FASTSET 3X (CAST SUPPLIES)
SPONGE GAUZE 4X4 12PLY (GAUZE/BANDAGES/DRESSINGS) ×3 IMPLANT
STOCKINETTE 4X48 STRL (DRAPES) ×3 IMPLANT
STRIP CLOSURE SKIN 1/2X4 (GAUZE/BANDAGES/DRESSINGS) IMPLANT
SUT ETHILON 4 0 PS 2 18 (SUTURE) ×3 IMPLANT
SUT MNCRL AB 4-0 PS2 18 (SUTURE) IMPLANT
SUT VIC AB 4-0 P2 18 (SUTURE) ×3 IMPLANT
SYR BULB 3OZ (MISCELLANEOUS) ×3 IMPLANT
SYR CONTROL 10ML LL (SYRINGE) ×3 IMPLANT
TOWEL OR 17X24 6PK STRL BLUE (TOWEL DISPOSABLE) ×3 IMPLANT
UNDERPAD 30X30 INCONTINENT (UNDERPADS AND DIAPERS) IMPLANT

## 2013-06-06 NOTE — Transfer of Care (Signed)
Immediate Anesthesia Transfer of Care Note  Patient: Corey Sellers  Procedure(s) Performed: Procedure(s): RELEASE TRIGGER FINGER/A-1 PULLEY LEFT SMALL (Left) LEFT WRIST EXCISION MASS (Left)  Patient Location: PACU  Anesthesia Type:General  Level of Consciousness: awake, sedated and patient cooperative  Airway & Oxygen Therapy: Patient Spontanous Breathing and Patient connected to face mask oxygen  Post-op Assessment: Report given to PACU RN and Post -op Vital signs reviewed and stable  Post vital signs: Reviewed and stable  Complications: No apparent anesthesia complications

## 2013-06-06 NOTE — Anesthesia Procedure Notes (Signed)
Procedure Name: LMA Insertion Date/Time: 06/06/2013 8:47 AM Performed by: Gar Gibbon Pre-anesthesia Checklist: Patient identified, Emergency Drugs available, Suction available and Patient being monitored Patient Re-evaluated:Patient Re-evaluated prior to inductionOxygen Delivery Method: Circle System Utilized Preoxygenation: Pre-oxygenation with 100% oxygen Intubation Type: IV induction Ventilation: Mask ventilation without difficulty LMA: LMA inserted LMA Size: 5.0 Number of attempts: 1 Airway Equipment and Method: bite block Placement Confirmation: positive ETCO2 Tube secured with: Tape Dental Injury: Teeth and Oropharynx as per pre-operative assessment

## 2013-06-06 NOTE — Anesthesia Preprocedure Evaluation (Signed)
Anesthesia Evaluation  Patient identified by MRN, date of birth, ID band Patient awake    Reviewed: Allergy & Precautions, H&P , NPO status , Patient's Chart, lab work & pertinent test results  Airway Mallampati: I TM Distance: >3 FB Neck ROM: Full    Dental  (+) Teeth Intact and Dental Advisory Given   Pulmonary  breath sounds clear to auscultation        Cardiovascular hypertension, Pt. on medications Rhythm:Regular Rate:Normal     Neuro/Psych    GI/Hepatic   Endo/Other  diabetes, Well Controlled  Renal/GU      Musculoskeletal   Abdominal   Peds  Hematology   Anesthesia Other Findings   Reproductive/Obstetrics                           Anesthesia Physical Anesthesia Plan  ASA: II  Anesthesia Plan: General   Post-op Pain Management:    Induction: Intravenous  Airway Management Planned: LMA  Additional Equipment:   Intra-op Plan:   Post-operative Plan: Extubation in OR  Informed Consent: I have reviewed the patients History and Physical, chart, labs and discussed the procedure including the risks, benefits and alternatives for the proposed anesthesia with the patient or authorized representative who has indicated his/her understanding and acceptance.   Dental advisory given  Plan Discussed with: CRNA, Anesthesiologist and Surgeon  Anesthesia Plan Comments:         Anesthesia Quick Evaluation

## 2013-06-06 NOTE — Anesthesia Postprocedure Evaluation (Signed)
  Anesthesia Post-op Note  Patient: Corey Sellers  Procedure(s) Performed: Procedure(s): RELEASE TRIGGER FINGER/A-1 PULLEY LEFT SMALL (Left) LEFT WRIST EXCISION MASS (Left)  Patient Location: PACU  Anesthesia Type:General  Level of Consciousness: awake, alert  and oriented  Airway and Oxygen Therapy: Patient Spontanous Breathing  Post-op Pain: mild  Post-op Assessment: Post-op Vital signs reviewed  Post-op Vital Signs: Reviewed  Complications: No apparent anesthesia complications

## 2013-06-06 NOTE — Op Note (Signed)
[id number] 

## 2013-06-06 NOTE — Brief Op Note (Signed)
06/06/2013  9:42 AM  PATIENT:  Corey Sellers  62 y.o. male  PRE-OPERATIVE DIAGNOSIS:  LEFT SMALL TRIGGER, LEFT WRIST VOLAR GANGLION  POST-OPERATIVE DIAGNOSIS:  LEFT SMALL TRIGGER, LEFT WRIST VOLAR GANGLION  PROCEDURE:  Procedure(s): RELEASE TRIGGER FINGER/A-1 PULLEY LEFT SMALL LEFT WRIST EXCISION MASS  SURGEON:  Surgeon(s): Tami Ribas, MD  PHYSICIAN ASSISTANT:   ASSISTANTS: none   ANESTHESIA:   general  EBL:  Total I/O In: 1000 [I.V.:1000] Out: -   DRAINS: none   LOCAL MEDICATIONS USED:  MARCAINE     SPECIMEN:  Source of Specimen:  left wrist  DISPOSITION OF SPECIMEN:  PATHOLOGY  COUNTS:  YES  TOURNIQUET:   Total Tourniquet Time Documented: Upper Arm (Left) - 43 minutes Total: Upper Arm (Left) - 43 minutes   DICTATION: .Other Dictation: Dictation Number [id number]  PLAN OF CARE: Discharge to home after PACU

## 2013-06-06 NOTE — H&P (Signed)
Corey Sellers is an 62 y.o. male.   Chief Complaint: left wrist ganglion and small finger trigger digit HPI: 62 yo male with complaints of left small finger trigger digit and wrist ganglion.  These are bothersome to him.  He has had injection of the trigger digit with recurrence.  He wishes to have a trigger digit release and excision of the mass on his left wrist.  Past Medical History  Diagnosis Date  . Hypertension   . Diabetes mellitus   . GERD (gastroesophageal reflux disease)   . Wears glasses   . Melanoma of neck   . Parotid gland adenocarcinoma     lt -2013-[facility]    Past Surgical History  Procedure Laterality Date  . Tonsillectomy    . Appendectomy    . Varicocele excision      left  . Basal cell carcinoma excision      left chin  . Joint replacement  [id number]    Rt shoulder  . Parotidectomy w/ neck dissection total  2013    left parotidectomy-26 nodes removed  . Trigger finger release Right 05/23/2013    Procedure: RELEASE TRIGGER FINGER/A-1 PULLEY RIGHT INDEX AND LONG;  Surgeon: Tami Ribas, MD;  Location: [facility];  Service: Orthopedics;  Laterality: Right;  . Excision metacarpal mass Right 05/23/2013    Procedure: RIGHT LONG EXCISION ANNULAR LIGAMENT CYST;  Surgeon: Tami Ribas, MD;  Location: [facility];  Service: Orthopedics;  Laterality: Right;    History reviewed. No pertinent family history. Social History:  reports that he quit smoking about 14 years ago. He does not have any smokeless tobacco history on file. He reports that  drinks alcohol. He reports that he does not use illicit drugs.  Allergies:  Allergies  Allergen Reactions  . Other Rash    Metal sensitivity - redness and irritation at site when staples used in incision closure    Medications Prior to Admission  Medication Sig Dispense Refill  . aspirin EC 81 MG tablet Take 81 mg by mouth every evening.      . etodolac (LODINE) 400 MG tablet Take 400 mg by  mouth 2 (two) times daily.      Marland Kitchen HYDROcodone-acetaminophen (NORCO) 5-325 MG per tablet 1-2 tabs po q6 hours prn pain  30 tablet  0  . Multiple Vitamin (MULTIVITAMIN WITH MINERALS) TABS Take 1 tablet by mouth daily.      . Olmesartan-Amlodipine-HCTZ (TRIBENZOR) 40-10-25 MG TABS Take 1 tablet by mouth every evening.      Marland Kitchen omeprazole (PRILOSEC) 20 MG capsule Take 20 mg by mouth daily.      . rosuvastatin (CRESTOR) 40 MG tablet Take 20 mg by mouth every evening.        No results found for this or any previous visit (from the past 48 hour(s)).  No results found.   A comprehensive review of systems was negative except for: Eyes: positive for contacts/glasses Ears, nose, mouth, throat, and face: positive for tinnitus Respiratory: positive for cough and pneumonia Neurological: positive for headaches  Blood pressure 125/73, pulse 72, temperature 97.5 F (36.4 C), temperature source Oral, resp. rate 20, height 5\' 10"  (1.778 m), weight 224 lb (101.606 kg), SpO2 97.00%.  General appearance: alert, cooperative and appears stated age Head: Normocephalic, without obvious abnormality, atraumatic Neck: supple, symmetrical, trachea midline Resp: clear to auscultation bilaterally Cardio: regular rate and rhythm GI: non tender Extremities: intact sensation and capillary refill all digits.  +epl/fpl/io.  mass palpable at radial side of wrist.  tender flexor nodule small finger. Pulses: 2+ and symmetric Skin: Skin color, texture, turgor normal. No rashes or lesions Neurologic: Grossly normal Incision/Wound: na  Assessment/Plan Left small trigger digit and wrist ganglion.  Non operative and operative treatment options were discussed with the patient and patient wishes to proceed with operative treatment. Risks, benefits, and alternatives of surgery were discussed and the patient agrees with the plan of care.   Seraphim Affinito R 06/06/2013, 8:27 AM

## 2013-06-07 ENCOUNTER — Encounter (HOSPITAL_BASED_OUTPATIENT_CLINIC_OR_DEPARTMENT_OTHER): Payer: Self-pay | Admitting: Orthopedic Surgery

## 2013-06-07 LAB — POCT HEMOGLOBIN-HEMACUE: Hemoglobin: 14.8 g/dL (ref 13.0–17.0)

## 2013-06-07 NOTE — Op Note (Signed)
Corey Sellers, Corey Sellers               ACCOUNT NO.:  [id number]  MEDICAL RECORD NO.:  [id number]  LOCATION:                                 FACILITY:  PHYSICIAN:  Betha Loa, MD             DATE OF BIRTH:  DATE OF PROCEDURE:  06/06/2013 DATE OF DISCHARGE:                              OPERATIVE REPORT   PREOPERATIVE DIAGNOSIS:  Left wrist ganglion cyst and left small finger trigger digit.  POSTOPERATIVE DIAGNOSIS:  Left wrist ganglion cyst and left small finger trigger digit.  PROCEDURE:   1. Left wrist excision of volar ganglion  2. Left small finger trigger digit release  SURGEON:  Betha Loa, MD  ASSISTANT:  None.  ANESTHESIA:  General.  IV FLUIDS:  Per anesthesia flow sheet.  ESTIMATED BLOOD LOSS:  Minimal.  COMPLICATIONS:  None.  SPECIMENS:  Left wrist mass to Pathology.  DISPOSITION:  Stable to PACU.  INDICATIONS:  Corey Sellers is a 62 year old male who has had triggering of the left small finger, which has been injected and recurrent.  He also has a mass at the left wrist.  He wishes to have the mass excised and trigger digit release.  Risks, benefits and alternatives of surgery were discussed including the risk of blood loss; infection; damage to nerves, vessels, tendons, ligaments, bone; failure of surgery; need for additional surgery; complications with wound healing; continued pain; recurrence of the mass and recurrence of triggering.  He voiced understanding of these risks and elected to proceed.  OPERATIVE COURSE:  After being identified preoperatively by myself, the patient and I agreed upon the procedure and site of procedure.  Surgical site was marked.  Risks, benefits, and alternatives of the surgery were reviewed and he wished to proceed.  Surgical consent had been signed. He was given IV antibiotics as preoperative antibiotic prophylaxis.  He was transported to the operating room and placed on the operating room table in supine position with left  upper extremity on an armboard. General anesthesia was induced by anesthesiologist.  The left upper extremity was prepped and draped in normal sterile orthopedic fashion. A surgical pause was performed between the surgeons, anesthesia, and operating room staff, and all were in agreement as to the patient, procedure, and site of procedure.  Tourniquet at the proximal aspect of the extremity was inflated to 250 mmHg after exsanguination of limb with an Esmarch bandage.  The trigger finger was addressed first.  An oblique incision was made over the volar aspect of the MP joint.  This was carried into subcutaneous tissues by spreading technique.  The A1 pulley was identified and was incised sharply.  The proximal 2-mm of A2 pulley was incised as well to create a funnel for the tendon travel.  The tendons were adherent together with thickened synovium.  This was released.  The tendons were pulled out through the wound and no triggering was noted in the finger.  The wound was copiously irrigated with sterile saline.  The neurovascular bundles were protected throughout the case.  The wound was closed with 4-0 nylon in a horizontal mattress fashion.  Attention was turned to the wrist.  An incision was made at the radial side of the wrist and carried into subcutaneous tissues by spreading technique.  Bipolar electrocautery was used to obtain hemostasis.  The cyst was easily identified.  It was adherent to the subcutaneous tissues and the vein was easily freed up. It was freed of all its surrounding soft tissue adhesion.  The stalk was identified and traced down to the radiocarpal joint.  The cyst was removed and sent to Pathology for examination.  A 4-0 Vicryl suture was used in a figure-of-eight fashion to repair the capsular rent where the source of the cyst was.  The area was then bipolared to try and induce scar formation.  The wound was copiously irrigated with sterile saline. It was then  closed with 4-0 nylon in a horizontal mattress fashion. Both wounds were injected with total of 8 mL of 0.25% plain Marcaine to aid in postoperative analgesia.  They were dressed with sterile Xeroform, 4x4s, and wrapped with a Kerlix bandage.  A volar splint was placed and wrapped with Kerlix and Ace bandage.  Tourniquet was deflated at 43 minutes.  Fingertips were pink with brisk capillary refill after deflation of the tourniquet.  The operative drapes were broken down and the patient was awakened from anesthesia safely.  He was transferred back to the stretcher and taken to PACU in stable condition.  I will see him back in the office in 1 week for postoperative followup.  I will give him Norco 5/325 1-2 p.o. q.6 hours p.r.n. pain, dispensed #30.     Betha Loa, MD     KK/MEDQ  D:  06/06/2013  T:  06/07/2013  Job:  [id number]

## 2013-12-28 ENCOUNTER — Emergency Department (HOSPITAL_BASED_OUTPATIENT_CLINIC_OR_DEPARTMENT_OTHER)
Admission: EM | Admit: 2013-12-28 | Discharge: 2013-12-28 | Disposition: A | Discharge status: Home / Self Care | Payer: Worker's Compensation | Attending: Emergency Medicine | Admitting: Emergency Medicine

## 2013-12-28 ENCOUNTER — Emergency Department (HOSPITAL_BASED_OUTPATIENT_CLINIC_OR_DEPARTMENT_OTHER): Payer: Worker's Compensation

## 2013-12-28 ENCOUNTER — Encounter (HOSPITAL_BASED_OUTPATIENT_CLINIC_OR_DEPARTMENT_OTHER): Payer: Self-pay | Admitting: Emergency Medicine

## 2013-12-28 DIAGNOSIS — S63501A Unspecified sprain of right wrist, initial encounter: Secondary | ICD-10-CM

## 2013-12-28 DIAGNOSIS — IMO0002 Reserved for concepts with insufficient information to code with codable children: Secondary | ICD-10-CM | POA: Insufficient documentation

## 2013-12-28 DIAGNOSIS — S0181XA Laceration without foreign body of other part of head, initial encounter: Secondary | ICD-10-CM

## 2013-12-28 DIAGNOSIS — Y990 Civilian activity done for income or pay: Secondary | ICD-10-CM | POA: Insufficient documentation

## 2013-12-28 DIAGNOSIS — T07XXXA Unspecified multiple injuries, initial encounter: Secondary | ICD-10-CM

## 2013-12-28 DIAGNOSIS — Z85819 Personal history of malignant neoplasm of unspecified site of lip, oral cavity, and pharynx: Secondary | ICD-10-CM | POA: Insufficient documentation

## 2013-12-28 DIAGNOSIS — Z789 Other specified health status: Secondary | ICD-10-CM | POA: Insufficient documentation

## 2013-12-28 DIAGNOSIS — Y9289 Other specified places as the place of occurrence of the external cause: Secondary | ICD-10-CM | POA: Insufficient documentation

## 2013-12-28 DIAGNOSIS — S058X9A Other injuries of unspecified eye and orbit, initial encounter: Secondary | ICD-10-CM | POA: Insufficient documentation

## 2013-12-28 DIAGNOSIS — W1809XA Striking against other object with subsequent fall, initial encounter: Secondary | ICD-10-CM | POA: Insufficient documentation

## 2013-12-28 DIAGNOSIS — S8002XA Contusion of left knee, initial encounter: Secondary | ICD-10-CM

## 2013-12-28 DIAGNOSIS — W108XXA Fall (on) (from) other stairs and steps, initial encounter: Secondary | ICD-10-CM | POA: Insufficient documentation

## 2013-12-28 DIAGNOSIS — I10 Essential (primary) hypertension: Secondary | ICD-10-CM | POA: Insufficient documentation

## 2013-12-28 DIAGNOSIS — S0230XA Fracture of orbital floor, unspecified side, initial encounter for closed fracture: Secondary | ICD-10-CM

## 2013-12-28 DIAGNOSIS — S63509A Unspecified sprain of unspecified wrist, initial encounter: Secondary | ICD-10-CM | POA: Insufficient documentation

## 2013-12-28 DIAGNOSIS — Z791 Long term (current) use of non-steroidal anti-inflammatories (NSAID): Secondary | ICD-10-CM | POA: Insufficient documentation

## 2013-12-28 DIAGNOSIS — E119 Type 2 diabetes mellitus without complications: Secondary | ICD-10-CM | POA: Insufficient documentation

## 2013-12-28 DIAGNOSIS — Z8739 Personal history of other diseases of the musculoskeletal system and connective tissue: Secondary | ICD-10-CM | POA: Insufficient documentation

## 2013-12-28 DIAGNOSIS — S8000XA Contusion of unspecified knee, initial encounter: Secondary | ICD-10-CM | POA: Insufficient documentation

## 2013-12-28 DIAGNOSIS — Z7982 Long term (current) use of aspirin: Secondary | ICD-10-CM | POA: Insufficient documentation

## 2013-12-28 DIAGNOSIS — Z87891 Personal history of nicotine dependence: Secondary | ICD-10-CM | POA: Insufficient documentation

## 2013-12-28 DIAGNOSIS — Z79899 Other long term (current) drug therapy: Secondary | ICD-10-CM | POA: Insufficient documentation

## 2013-12-28 DIAGNOSIS — K219 Gastro-esophageal reflux disease without esophagitis: Secondary | ICD-10-CM | POA: Insufficient documentation

## 2013-12-28 DIAGNOSIS — S0180XA Unspecified open wound of other part of head, initial encounter: Secondary | ICD-10-CM | POA: Insufficient documentation

## 2013-12-28 MED ORDER — TETRACAINE HCL 0.5 % OP SOLN
OPHTHALMIC | Status: AC
  Administered 2013-12-28: 2 [drp]
  Filled 2013-12-28: qty 2

## 2013-12-28 MED ORDER — FLUORESCEIN SODIUM 1 MG OP STRP
ORAL_STRIP | OPHTHALMIC | Status: AC
  Administered 2013-12-28: 15:00:00
  Filled 2013-12-28: qty 1

## 2013-12-28 MED ORDER — HYDROCODONE-ACETAMINOPHEN 5-325 MG PO TABS: ORAL_TABLET | ORAL | Status: DC

## 2013-12-28 NOTE — ED Notes (Signed)
[facility] report-pt fell on steps at work-fell onto face-had glasses on -lac x 2 to right lateral eye-no LOC-pain to face and left knee-6/10

## 2013-12-28 NOTE — ED Notes (Signed)
Pt states he fell going down steps at work-safety glasses broke-lac to lateral right-no LOC-denies head or neck pain-pain to face and left knee

## 2013-12-28 NOTE — ED Provider Notes (Signed)
CSN: [id number]     Arrival date & time 12/28/13  1314 History   First MD Initiated Contact with Patient 12/28/13 1327     Chief Complaint  Patient presents with  . Fall     (Consider location/radiation/quality/duration/timing/severity/associated sxs/prior Treatment) Patient is a 63 y.o. male presenting with fall.  Fall   Pt was at work today when he stumbled and fell going up some stairs, attempted to stop himself by grabbing the rail but hit his R face on the steps and hit his bilateral knees. He reports pain mostly in L knee, mild pain in R wrist, both worse with movement. He has some pain in R face and eye worse with eye movement. Denies any headache or LOC.   Past Medical History  Diagnosis Date  . Hypertension   . Diabetes mellitus   . GERD (gastroesophageal reflux disease)   . Wears glasses   . Melanoma of neck   . Parotid gland adenocarcinoma     lt -2013-[facility]   Past Surgical History  Procedure Laterality Date  . Tonsillectomy    . Appendectomy    . Varicocele excision      left  . Basal cell carcinoma excision      left chin  . Joint replacement  [id number]    Rt shoulder  . Parotidectomy w/ neck dissection total  2013    left parotidectomy-26 nodes removed  . Trigger finger release Right 05/23/2013    Procedure: RELEASE TRIGGER FINGER/A-1 PULLEY RIGHT INDEX AND LONG;  Surgeon: Tennis Must, MD;  Location: [place];  Service: Orthopedics;  Laterality: Right;  . Excision metacarpal mass Right 05/23/2013    Procedure: RIGHT LONG EXCISION ANNULAR LIGAMENT CYST;  Surgeon: Tennis Must, MD;  Location: [place];  Service: Orthopedics;  Laterality: Right;  . Trigger finger release Left 06/06/2013    Procedure: RELEASE TRIGGER FINGER/A-1 PULLEY LEFT SMALL;  Surgeon: Tennis Must, MD;  Location: [place];  Service: Orthopedics;  Laterality: Left;  . Ganglion cyst excision Left 06/06/2013    Procedure: LEFT WRIST EXCISION  MASS;  Surgeon: Tennis Must, MD;  Location: [place];  Service: Orthopedics;  Laterality: Left;   No family history on file. History  Substance Use Topics  . Smoking status: Former Smoker    Quit date: 05/18/1999  . Smokeless tobacco: Not on file  . Alcohol Use: Yes     Comment: occ    Review of Systems All other systems reviewed and are negative except as noted in HPI.     Allergies  Other  Home Medications   Current Outpatient Rx  Name  Route  Sig  Dispense  Refill  . Cholecalciferol (VITAMIN D-3 PO)   Oral   Take by mouth.         Marland Kitchen aspirin EC 81 MG tablet   Oral   Take 81 mg by mouth every evening.         . etodolac (LODINE) 400 MG tablet   Oral   Take 400 mg by mouth 2 (two) times daily.         Marland Kitchen HYDROcodone-acetaminophen (NORCO) 5-325 MG per tablet      1-2 tabs po q6 hours prn pain   30 tablet   0   . HYDROcodone-acetaminophen (NORCO) 5-325 MG per tablet      1-2 tabs po q6 hours prn pain   30 tablet   0   .  Multiple Vitamin (MULTIVITAMIN WITH MINERALS) TABS   Oral   Take 1 tablet by mouth daily.         . Olmesartan-Amlodipine-HCTZ (TRIBENZOR) 40-10-25 MG TABS   Oral   Take 1 tablet by mouth every evening.         Marland Kitchen omeprazole (PRILOSEC) 20 MG capsule   Oral   Take 20 mg by mouth daily.         . rosuvastatin (CRESTOR) 40 MG tablet   Oral   Take 20 mg by mouth every evening.          BP 164/74  Pulse 86  Temp(Src) 97.9 F (36.6 C) (Oral)  Resp 16  Ht 5' 9.75" (1.772 m)  Wt 234 lb (106.142 kg)  BMI 33.80 kg/m2  SpO2 95% Physical Exam  Nursing note and vitals reviewed. Constitutional: He is oriented to person, place, and time. He appears well-developed and well-nourished.  HENT:  Head: Normocephalic.  Multiple lacerations to R face; 2cm and 1.5 cm laceration to R eyelid, a smaller 1cm laceration just lateral to then lateral canthus and a large deep tissue abrasion to R cheek not amenable to repair   Eyes: EOM are normal. Pupils are equal, round, and reactive to light.  Neck: Normal range of motion. Neck supple.  Cardiovascular: Normal rate, normal heart sounds and intact distal pulses.   Pulmonary/Chest: Effort normal and breath sounds normal.  Abdominal: Bowel sounds are normal. He exhibits no distension. There is no tenderness.  Musculoskeletal: Normal range of motion. He exhibits tenderness (L knee, abrasion there, no deformity; mild R wrist, no deformity). He exhibits no edema.  Neurological: He is alert and oriented to person, place, and time. He has normal strength. No cranial nerve deficit or sensory deficit.  Skin: Skin is warm and dry. No rash noted.  Abrasion R knee and R ankle  Psychiatric: He has a normal mood and affect.    ED Course  Procedures (including critical care time)  LACERATION REPAIR Performed by: Truddie Hidden. Consent: Verbal consent obtained. Risks and benefits: risks, benefits and alternatives were discussed Patient identity confirmed: provided demographic data Time out performed prior to procedure Prepped and Draped in normal sterile fashion Wound explored Laceration #1 Location: R eyelid Laceration Length: 2cm No Foreign Bodies seen or palpated Anesthesia: local infiltration Local anesthetic: lidocaine 2% with epinephrine Anesthetic total: 1 ml Irrigation method: syringe Amount of cleaning: standard Skin closure: 6-0 nylon Number of sutures or staples: 5 Technique: simple Patient tolerance: Patient tolerated the procedure well with no immediate complications.  Laceration #2 Location: R eyelid Laceration Length: 1.5cm No Foreign Bodies seen or palpated Anesthesia: local infiltration Local anesthetic: lidocaine 2% with epinephrine Anesthetic total: 1 ml Irrigation method: syringe Amount of cleaning: standard Skin closure: 6-0 nylon Number of sutures or staples: 4 Technique: simple Patient tolerance: Patient tolerated the procedure  well with no immediate complications.  Laceration #3 Location: R eyelid Laceration Length: 1cm No Foreign Bodies seen or palpated Anesthesia: local infiltration Local anesthetic: lidocaine 2% with epinephrine Anesthetic total: 1 ml Irrigation method: syringe Amount of cleaning: standard Skin closure: 6-0 nylon Number of sutures or staples: 1 Technique: simple Patient tolerance: Patient tolerated the procedure well with no immediate complications.  Labs Review Labs Reviewed - No data to display Imaging Review Dg Wrist Complete Right  12/28/2013   CLINICAL DATA:  Right wrist pain after fall  EXAM: RIGHT WRIST - COMPLETE 3+ VIEW  COMPARISON:  None.  FINDINGS: There is  no evidence for acute fracture. No dislocation. Carpal alignment is intact. Soft tissues are unremarkable.  IMPRESSION: Negative.   Electronically Signed   By: Misty Stanley M.D.   On: 12/28/2013 14:29   Dg Knee Complete 4 Views Left  12/28/2013   CLINICAL DATA:  Left knee pain after fall  EXAM: LEFT KNEE - COMPLETE 4+ VIEW  COMPARISON:  None.  FINDINGS: No evidence for fracture. No subluxation or dislocation. There is trace spurring in the patellofemoral compartment. No joint effusion. Superficial edema is identified in the distal aspect of the anterior thigh the.  IMPRESSION: No acute bony findings.  No evidence for joint effusion.   Electronically Signed   By: Misty Stanley M.D.   On: 12/28/2013 14:27   Ct Maxillofacial Wo Cm  12/28/2013   CLINICAL DATA:  [place] today. Laceration in the right periorbital region with associated swelling.  EXAM: CT MAXILLOFACIAL WITHOUT CONTRAST  TECHNIQUE: Multidetector CT imaging of the maxillofacial structures was performed. Multiplanar CT image reconstructions were also generated. A small metallic BB was placed on the right temple in order to reliably differentiate right from left.  COMPARISON:  None.  FINDINGS: There is a right orbital floor fracture. There is nose significant combination, but  the lateral margin of the fracture is depressed 4.4 mm. The fracture is of the anterior orbital floor. The central posterior lower is intact. There is no entrapment of the extra-ocular muscles. The right maxillary sinus is partly filled with hemorrhage.  No other fractures. Both globes are unremarkable. The postseptal orbit on the right is unremarkable. There is right periorbital soft tissue swelling.  Remaining sinuses are clear. Mastoid air cells and middle ear cavities are clear.  There has been a left submandibular resection and soft tissue masses or adenopathy.  IMPRESSION: 1. Mildly depressed right orbital floor fracture as described. No other fractures.   Electronically Signed   By: Lajean Manes M.D.   On: 12/28/2013 14:26     EKG Interpretation None      MDM   Final diagnoses:  Orbital floor fracture  Facial laceration  Contusion of left knee  Sprain of right wrist  Abrasion, multiple sites   Imaging reviewed with the patient. Orbital floor fracture without impingement. No corneal abrasion on fluorescein exam. Advised ENT followup. Suture removal in 5-7 days. Pt wants to do light duty at work, confirmed with Freight forwarder at bedside.     Charles B. Karle Starch, MD 12/28/13 [phone]

## 2013-12-28 NOTE — Discharge Instructions (Signed)
Orbital Floor Fracture, Non-Blowout The eye sits in the part of the skull called the "orbit." The upper and outside walls of the orbit are thick and strong. The inside wall (near the nose) and the orbital floor are very thin and weak. The tissues around the eye will briefly press together if there is a direct blow to the front of the eye. This leads to high pressure against the orbital walls. The inside wall and the orbital floor may break since these are the weakest walls. If the orbital floor breaks, the tissues around the eye, including the muscle that makes the eye look down, may become trapped in the sinus below when the orbital floor "blows out." If a blowout does not happen, the orbital floor fracture is considered a non-blowout orbital fracture. CAUSES An orbital floor fracture can be caused by any accident in which an object hits the face or the face strikes against a hard object. The most common ways that people break their eye socket include:  Being hit by a blunt object, such as a baseball bat or a fist.  Striking the face on the car dashboard during a crash.  Falls.  Gunshot. SYMPTOMS  If there has been no injury to the eye itself, symptoms may include:  Puffiness (swelling) and bruising around the eye area (black eye).  Numbness of the cheek and upper gum on the side with the floor fracture. This is caused by nerve injury to these areas.  Pain around the eye.  Headache.  Ear pain on the injured side. DIAGNOSIS The diagnosis of an orbital floor fracture is suspected during an eye exam by an ophthalmologist. It is confirmed by X rays or CT scan. TREATMENT Your caregiver may suggest waiting 1 or 2 weeks for the swelling to go away before examining the eye. When the swelling lessens, your caregiver will examine the eye to see if there is any sign of a trapped muscle or double vision when looking in different directions. If double vision is not found and muscle or tissue did not  get trapped, no further treatment is necessary. After that, in almost all cases, the bones heal together on their own.  HOME CARE INSTRUCTIONS  Take all pain medicine as directed by your caregiver.  Use ice packs or other cold therapy to reduce swelling as directed by your caregiver.  Do not put a contact lens in the injured eye until your caregiver approves.  Avoid dusty environments.  Always wear protective glasses or goggles when recommended. Wearing protective eyewear is not dangerous to your injured eye and will not delay healing.  As long as your other eye is seeing normally, you may return to work and drive.  You may travel by plane or be in high altitudes. However, your swelling may take longer to go away, and you may have sinus pain.  Be aware that your depth perception and your ability to judge distance may be reduced or lost. SEEK IMMEDIATE MEDICAL CARE IF:  Your vision changes.  Your redness or swelling persists around the injured eye or gets worse.  You start to have double vision.  You have a bloody or discolored discharge from your nose.  You have a fever that lasts longer than 2 to 3 days.  You have a fever that suddenly gets worse.  Your cheek or upper gum numbness does not go away. MAKE SURE YOU:  Understand these instructions.  Will watch your condition.  Will get help right away  if you are not doing well or get worse. Document Released: 12/08/2011 Document Reviewed: 12/08/2011 [facility] Patient Information 2014 [place]. Facial Laceration  A facial laceration is a cut on the face. These injuries can be painful and cause bleeding. Lacerations usually heal quickly, but they need special care to reduce scarring. DIAGNOSIS  Your health care provider will take a medical history, ask for details about how the injury occurred, and examine the wound to determine how deep the cut is. TREATMENT  Some facial lacerations may not require closure. Others may  not be able to be closed because of an increased risk of infection. The risk of infection and the chance for successful closure will depend on various factors, including the amount of time since the injury occurred. The wound may be cleaned to help prevent infection. If closure is appropriate, pain medicines may be given if needed. Your health care provider will use stitches (sutures), wound glue (adhesive), or skin adhesive strips to repair the laceration. These tools bring the skin edges together to allow for faster healing and a better cosmetic outcome. If needed, you may also be given a tetanus shot. HOME CARE INSTRUCTIONS  Only take over-the-counter or prescription medicines as directed by your health care provider.  Follow your health care provider's instructions for wound care. These instructions will vary depending on the technique used for closing the wound. For Sutures:  Keep the wound clean and dry.   If you were given a bandage (dressing), you should change it at least once a day. Also change the dressing if it becomes wet or dirty, or as directed by your health care provider.   Wash the wound with soap and water 2 times a day. Rinse the wound off with water to remove all soap. Pat the wound dry with a clean towel.   After cleaning, apply a thin layer of the antibiotic ointment recommended by your health care provider. This will help prevent infection and keep the dressing from sticking.   You may shower as usual after the first 24 hours. Do not soak the wound in water until the sutures are removed.   Get your sutures removed as directed by your health care provider. With facial lacerations, sutures should usually be taken out after 4 5 days to avoid stitch marks.   Wait a few days after your sutures are removed before applying any makeup. For Skin Adhesive Strips:  Keep the wound clean and dry.   Do not get the skin adhesive strips wet. You may bathe carefully, using  caution to keep the wound dry.   If the wound gets wet, pat it dry with a clean towel.   Skin adhesive strips will fall off on their own. You may trim the strips as the wound heals. Do not remove skin adhesive strips that are still stuck to the wound. They will fall off in time.  For Wound Adhesive:  You may briefly wet your wound in the shower or bath. Do not soak or scrub the wound. Do not swim. Avoid periods of heavy sweating until the skin adhesive has fallen off on its own. After showering or bathing, gently pat the wound dry with a clean towel.   Do not apply liquid medicine, cream medicine, ointment medicine, or makeup to your wound while the skin adhesive is in place. This may loosen the film before your wound is healed.   If a dressing is placed over the wound, be careful not to apply  tape directly over the skin adhesive. This may cause the adhesive to be pulled off before the wound is healed.   Avoid prolonged exposure to sunlight or tanning lamps while the skin adhesive is in place.  The skin adhesive will usually remain in place for 5 10 days, then naturally fall off the skin. Do not pick at the adhesive film.  After Healing: Once the wound has healed, cover the wound with sunscreen during the day for 1 full year. This can help minimize scarring. Exposure to ultraviolet light in the first year will darken the scar. It can take 1 2 years for the scar to lose its redness and to heal completely.  SEEK IMMEDIATE MEDICAL CARE IF:  You have redness, pain, or swelling around the wound.   You see ayellowish-white fluid (pus) coming from the wound.   You have chills or a fever.  MAKE SURE YOU:  Understand these instructions.  Will watch your condition.  Will get help right away if you are not doing well or get worse. Document Released: 10/23/2004 Document Revised: 07/06/2013 Document Reviewed: 04/28/2013 [facility] Patient Information 2014 [place], [place].

## 2013-12-28 NOTE — ED Notes (Signed)
UDS performed on Pt

## 2016-05-24 ENCOUNTER — Inpatient Hospital Stay (HOSPITAL_COMMUNITY)
Admission: EM | Admit: 2016-05-24 | Discharge: 2016-05-30 | DRG: 516 | Disposition: A | Discharge status: Rehabilitation Facility | Payer: BLUE CROSS/BLUE SHIELD | Attending: General Surgery | Admitting: General Surgery

## 2016-05-24 ENCOUNTER — Emergency Department (HOSPITAL_COMMUNITY): Payer: BLUE CROSS/BLUE SHIELD

## 2016-05-24 ENCOUNTER — Encounter (HOSPITAL_COMMUNITY): Payer: Self-pay | Admitting: Emergency Medicine

## 2016-05-24 DIAGNOSIS — R402412 Glasgow coma scale score 13-15, at arrival to emergency department: Secondary | ICD-10-CM | POA: Diagnosis present

## 2016-05-24 DIAGNOSIS — D696 Thrombocytopenia, unspecified: Secondary | ICD-10-CM

## 2016-05-24 DIAGNOSIS — E1165 Type 2 diabetes mellitus with hyperglycemia: Secondary | ICD-10-CM | POA: Diagnosis present

## 2016-05-24 DIAGNOSIS — R509 Fever, unspecified: Secondary | ICD-10-CM

## 2016-05-24 DIAGNOSIS — K219 Gastro-esophageal reflux disease without esophagitis: Secondary | ICD-10-CM | POA: Diagnosis present

## 2016-05-24 DIAGNOSIS — Z791 Long term (current) use of non-steroidal anti-inflammatories (NSAID): Secondary | ICD-10-CM

## 2016-05-24 DIAGNOSIS — N501 Vascular disorders of male genital organs: Secondary | ICD-10-CM | POA: Diagnosis not present

## 2016-05-24 DIAGNOSIS — Z6836 Body mass index (BMI) 36.0-36.9, adult: Secondary | ICD-10-CM | POA: Diagnosis not present

## 2016-05-24 DIAGNOSIS — D62 Acute posthemorrhagic anemia: Secondary | ICD-10-CM | POA: Diagnosis present

## 2016-05-24 DIAGNOSIS — S32129A Unspecified Zone II fracture of sacrum, initial encounter for closed fracture: Secondary | ICD-10-CM | POA: Diagnosis present

## 2016-05-24 DIAGNOSIS — I10 Essential (primary) hypertension: Secondary | ICD-10-CM | POA: Diagnosis present

## 2016-05-24 DIAGNOSIS — Z79891 Long term (current) use of opiate analgesic: Secondary | ICD-10-CM

## 2016-05-24 DIAGNOSIS — W19XXXD Unspecified fall, subsequent encounter: Secondary | ICD-10-CM | POA: Diagnosis not present

## 2016-05-24 DIAGNOSIS — Z419 Encounter for procedure for purposes other than remedying health state, unspecified: Secondary | ICD-10-CM

## 2016-05-24 DIAGNOSIS — S300XXA Contusion of lower back and pelvis, initial encounter: Secondary | ICD-10-CM | POA: Diagnosis present

## 2016-05-24 DIAGNOSIS — S32039A Unspecified fracture of third lumbar vertebra, initial encounter for closed fracture: Secondary | ICD-10-CM | POA: Diagnosis present

## 2016-05-24 DIAGNOSIS — W19XXXA Unspecified fall, initial encounter: Secondary | ICD-10-CM | POA: Diagnosis present

## 2016-05-24 DIAGNOSIS — Z85818 Personal history of malignant neoplasm of other sites of lip, oral cavity, and pharynx: Secondary | ICD-10-CM

## 2016-05-24 DIAGNOSIS — S32810A Multiple fractures of pelvis with stable disruption of pelvic ring, initial encounter for closed fracture: Secondary | ICD-10-CM

## 2016-05-24 DIAGNOSIS — W12XXXA Fall on and from scaffolding, initial encounter: Secondary | ICD-10-CM | POA: Diagnosis present

## 2016-05-24 DIAGNOSIS — R58 Hemorrhage, not elsewhere classified: Secondary | ICD-10-CM

## 2016-05-24 DIAGNOSIS — K5900 Constipation, unspecified: Secondary | ICD-10-CM | POA: Diagnosis present

## 2016-05-24 DIAGNOSIS — G8918 Other acute postprocedural pain: Secondary | ICD-10-CM | POA: Diagnosis not present

## 2016-05-24 DIAGNOSIS — I959 Hypotension, unspecified: Secondary | ICD-10-CM | POA: Diagnosis present

## 2016-05-24 DIAGNOSIS — Z96611 Presence of right artificial shoulder joint: Secondary | ICD-10-CM | POA: Diagnosis present

## 2016-05-24 DIAGNOSIS — Z7982 Long term (current) use of aspirin: Secondary | ICD-10-CM

## 2016-05-24 DIAGNOSIS — Z87891 Personal history of nicotine dependence: Secondary | ICD-10-CM

## 2016-05-24 DIAGNOSIS — Z85820 Personal history of malignant melanoma of skin: Secondary | ICD-10-CM

## 2016-05-24 DIAGNOSIS — K567 Ileus, unspecified: Secondary | ICD-10-CM | POA: Diagnosis not present

## 2016-05-24 DIAGNOSIS — S32038S Other fracture of third lumbar vertebra, sequela: Secondary | ICD-10-CM | POA: Diagnosis not present

## 2016-05-24 DIAGNOSIS — IMO0002 Reserved for concepts with insufficient information to code with codable children: Secondary | ICD-10-CM

## 2016-05-24 DIAGNOSIS — S32811A Multiple fractures of pelvis with unstable disruption of pelvic ring, initial encounter for closed fracture: Secondary | ICD-10-CM | POA: Diagnosis present

## 2016-05-24 DIAGNOSIS — R6510 Systemic inflammatory response syndrome (SIRS) of non-infectious origin without acute organ dysfunction: Secondary | ICD-10-CM

## 2016-05-24 DIAGNOSIS — R000 Tachycardia, unspecified: Secondary | ICD-10-CM | POA: Diagnosis present

## 2016-05-24 DIAGNOSIS — R339 Retention of urine, unspecified: Secondary | ICD-10-CM | POA: Diagnosis present

## 2016-05-24 DIAGNOSIS — S32811D Multiple fractures of pelvis with unstable disruption of pelvic ring, subsequent encounter for fracture with routine healing: Secondary | ICD-10-CM | POA: Diagnosis not present

## 2016-05-24 DIAGNOSIS — Z79899 Other long term (current) drug therapy: Secondary | ICD-10-CM | POA: Diagnosis not present

## 2016-05-24 DIAGNOSIS — M79609 Pain in unspecified limb: Secondary | ICD-10-CM | POA: Diagnosis not present

## 2016-05-24 DIAGNOSIS — E119 Type 2 diabetes mellitus without complications: Secondary | ICD-10-CM | POA: Diagnosis present

## 2016-05-24 DIAGNOSIS — T1490 Injury, unspecified: Secondary | ICD-10-CM | POA: Diagnosis not present

## 2016-05-24 DIAGNOSIS — S32810S Multiple fractures of pelvis with stable disruption of pelvic ring, sequela: Secondary | ICD-10-CM | POA: Diagnosis not present

## 2016-05-24 DIAGNOSIS — S3282XA Multiple fractures of pelvis without disruption of pelvic ring, initial encounter for closed fracture: Secondary | ICD-10-CM | POA: Diagnosis present

## 2016-05-24 DIAGNOSIS — E669 Obesity, unspecified: Secondary | ICD-10-CM | POA: Diagnosis present

## 2016-05-24 DIAGNOSIS — S329XXA Fracture of unspecified parts of lumbosacral spine and pelvis, initial encounter for closed fracture: Secondary | ICD-10-CM

## 2016-05-24 DIAGNOSIS — S3282XS Multiple fractures of pelvis without disruption of pelvic ring, sequela: Secondary | ICD-10-CM | POA: Diagnosis not present

## 2016-05-24 DIAGNOSIS — K5901 Slow transit constipation: Secondary | ICD-10-CM | POA: Diagnosis not present

## 2016-05-24 DIAGNOSIS — S32039D Unspecified fracture of third lumbar vertebra, subsequent encounter for fracture with routine healing: Secondary | ICD-10-CM | POA: Diagnosis not present

## 2016-05-24 DIAGNOSIS — R609 Edema, unspecified: Secondary | ICD-10-CM | POA: Diagnosis not present

## 2016-05-24 HISTORY — PX: IR GENERIC HISTORICAL: IMG1180011

## 2016-05-24 LAB — I-STAT CHEM 8, ED
BUN: 25 mg/dL — ABNORMAL HIGH (ref 6–20)
CALCIUM ION: 1.15 mmol/L (ref 1.12–1.23)
Chloride: 105 mmol/L (ref 101–111)
Creatinine, Ser: 0.9 mg/dL (ref 0.61–1.24)
GLUCOSE: 159 mg/dL — AB (ref 65–99)
HCT: 41 % (ref 39.0–52.0)
HEMOGLOBIN: 13.9 g/dL (ref 13.0–17.0)
Potassium: 3.9 mmol/L (ref 3.5–5.1)
Sodium: 142 mmol/L (ref 135–145)
TCO2: 26 mmol/L (ref 0–100)

## 2016-05-24 LAB — PREPARE FRESH FROZEN PLASMA
UNIT DIVISION: 0
UNIT DIVISION: 0

## 2016-05-24 LAB — TYPE AND SCREEN
ABO/RH(D): B POS
Antibody Screen: NEGATIVE
Unit division: 0
Unit division: 0

## 2016-05-24 LAB — GLUCOSE, CAPILLARY: Glucose-Capillary: 196 mg/dL — ABNORMAL HIGH (ref 65–99)

## 2016-05-24 LAB — COMPREHENSIVE METABOLIC PANEL
ALT: 65 U/L — ABNORMAL HIGH (ref 17–63)
AST: 60 U/L — ABNORMAL HIGH (ref 15–41)
Albumin: 4.1 g/dL (ref 3.5–5.0)
Alkaline Phosphatase: 75 U/L (ref 38–126)
Anion gap: 9 (ref 5–15)
BUN: 22 mg/dL — ABNORMAL HIGH (ref 6–20)
CHLORIDE: 107 mmol/L (ref 101–111)
CO2: 22 mmol/L (ref 22–32)
CREATININE: 1.2 mg/dL (ref 0.61–1.24)
Calcium: 9.5 mg/dL (ref 8.9–10.3)
Glucose, Bld: 158 mg/dL — ABNORMAL HIGH (ref 65–99)
POTASSIUM: 3.8 mmol/L (ref 3.5–5.1)
Sodium: 138 mmol/L (ref 135–145)
Total Bilirubin: 0.8 mg/dL (ref 0.3–1.2)
Total Protein: 7 g/dL (ref 6.5–8.1)

## 2016-05-24 LAB — URINALYSIS, ROUTINE W REFLEX MICROSCOPIC
GLUCOSE, UA: NEGATIVE mg/dL
HGB URINE DIPSTICK: NEGATIVE
KETONES UR: 15 mg/dL — AB
Leukocytes, UA: NEGATIVE
Nitrite: NEGATIVE
PROTEIN: NEGATIVE mg/dL
Specific Gravity, Urine: >1.046 — ABNORMAL HIGH (ref 1.005–1.030)
pH: 6.5 (ref 5.0–8.0)

## 2016-05-24 LAB — CBC
HCT: 41 % (ref 39.0–52.0)
Hemoglobin: 13.6 g/dL (ref 13.0–17.0)
MCH: 29.2 pg (ref 26.0–34.0)
MCHC: 33.2 g/dL (ref 30.0–36.0)
MCV: 88.2 fL (ref 78.0–100.0)
PLATELETS: 189 10*3/uL (ref 150–400)
RBC: 4.65 MIL/uL (ref 4.22–5.81)
RDW: 13.4 % (ref 11.5–15.5)
WBC: 11 10*3/uL — AB (ref 4.0–10.5)

## 2016-05-24 LAB — PROTIME-INR
INR: 1.13
PROTHROMBIN TIME: 14.6 s (ref 11.4–15.2)

## 2016-05-24 LAB — ABO/RH: ABO/RH(D): B POS

## 2016-05-24 LAB — MRSA PCR SCREENING: MRSA BY PCR: NEGATIVE

## 2016-05-24 LAB — ETHANOL

## 2016-05-24 MED ORDER — ONDANSETRON HCL 4 MG PO TABS: 4.0000 mg | ORAL_TABLET | Freq: Four times a day (QID) | ORAL | Status: DC | PRN

## 2016-05-24 MED ORDER — MIDAZOLAM HCL 2 MG/2ML IJ SOLN
INTRAMUSCULAR | Status: AC | PRN
  Administered 2016-05-24: 1 mg via INTRAVENOUS
  Administered 2016-05-24 (×3): 0.5 mg via INTRAVENOUS

## 2016-05-24 MED ORDER — FENTANYL CITRATE (PF) 100 MCG/2ML IJ SOLN
INTRAMUSCULAR | Status: AC
  Filled 2016-05-24: qty 6

## 2016-05-24 MED ORDER — FENTANYL CITRATE (PF) 100 MCG/2ML IJ SOLN
INTRAMUSCULAR | Status: AC | PRN
  Administered 2016-05-24 (×2): 25 ug via INTRAVENOUS
  Administered 2016-05-24: 50 ug via INTRAVENOUS
  Administered 2016-05-24: 25 ug via INTRAVENOUS

## 2016-05-24 MED ORDER — GELATIN ABSORBABLE 12-7 MM EX MISC
CUTANEOUS | Status: AC
  Filled 2016-05-24: qty 1

## 2016-05-24 MED ORDER — MORPHINE SULFATE (PF) 4 MG/ML IV SOLN
6.0000 mg | Freq: Once | INTRAVENOUS | Status: AC
  Administered 2016-05-24: 6 mg via INTRAVENOUS
  Filled 2016-05-24: qty 2

## 2016-05-24 MED ORDER — DOCUSATE SODIUM 100 MG PO CAPS
100.0000 mg | ORAL_CAPSULE | Freq: Two times a day (BID) | ORAL | Status: DC
  Administered 2016-05-24 – 2016-05-30 (×10): 100 mg via ORAL
  Filled 2016-05-24 (×10): qty 1

## 2016-05-24 MED ORDER — MIDAZOLAM HCL 2 MG/2ML IJ SOLN
INTRAMUSCULAR | Status: AC
  Filled 2016-05-24: qty 6

## 2016-05-24 MED ORDER — KCL IN DEXTROSE-NACL 20-5-0.45 MEQ/L-%-% IV SOLN
INTRAVENOUS | Status: DC
  Administered 2016-05-24: 100 mL via INTRAVENOUS
  Administered 2016-05-25 – 2016-05-26 (×3): via INTRAVENOUS
  Filled 2016-05-24 (×4): qty 1000

## 2016-05-24 MED ORDER — IOPAMIDOL (ISOVUE-300) INJECTION 61%
INTRAVENOUS | Status: AC
  Filled 2016-05-24: qty 100

## 2016-05-24 MED ORDER — FENTANYL CITRATE (PF) 100 MCG/2ML IJ SOLN
100.0000 ug | Freq: Once | INTRAMUSCULAR | Status: AC
  Administered 2016-05-24: 100 ug via INTRAVENOUS
  Filled 2016-05-24: qty 2

## 2016-05-24 MED ORDER — ACETAMINOPHEN 325 MG PO TABS: 650.0000 mg | ORAL_TABLET | ORAL | Status: DC | PRN

## 2016-05-24 MED ORDER — LIDOCAINE HCL 1 % IJ SOLN
INTRAMUSCULAR | Status: AC
  Filled 2016-05-24: qty 20

## 2016-05-24 MED ORDER — FENTANYL CITRATE (PF) 100 MCG/2ML IJ SOLN
INTRAMUSCULAR | Status: AC | PRN
  Administered 2016-05-24: 25 ug via INTRAVENOUS
  Administered 2016-05-24: 50 ug via INTRAVENOUS

## 2016-05-24 MED ORDER — PHENOL 1.4 % MT LIQD
1.0000 | OROMUCOSAL | Status: DC | PRN
  Administered 2016-05-24: 1 via OROMUCOSAL
  Filled 2016-05-24: qty 177

## 2016-05-24 MED ORDER — LORAZEPAM 0.5 MG PO TABS
0.5000 mg | ORAL_TABLET | Freq: Once | ORAL | Status: AC
Start: 2016-05-24 — End: 2016-05-24
  Administered 2016-05-24: 0.5 mg via ORAL
  Filled 2016-05-24: qty 1

## 2016-05-24 MED ORDER — ONDANSETRON HCL 4 MG/2ML IJ SOLN
4.0000 mg | Freq: Four times a day (QID) | INTRAMUSCULAR | Status: DC | PRN
Start: 2016-05-24 — End: 2016-05-30

## 2016-05-24 MED ORDER — IOPAMIDOL (ISOVUE-300) INJECTION 61%
INTRAVENOUS | Status: AC
  Filled 2016-05-24: qty 150

## 2016-05-24 MED ORDER — MIDAZOLAM HCL 2 MG/2ML IJ SOLN
INTRAMUSCULAR | Status: AC | PRN
  Administered 2016-05-24: 0.5 mg via INTRAVENOUS
  Administered 2016-05-24: 1 mg via INTRAVENOUS

## 2016-05-24 MED ORDER — SODIUM CHLORIDE 0.9 % IV BOLUS (SEPSIS)
1000.0000 mL | Freq: Once | INTRAVENOUS | Status: AC
  Administered 2016-05-24: 1000 mL via INTRAVENOUS

## 2016-05-24 MED ORDER — MORPHINE SULFATE (PF) 2 MG/ML IV SOLN
2.0000 mg | INTRAVENOUS | Status: DC | PRN
  Administered 2016-05-24 (×3): 4 mg via INTRAVENOUS
  Administered 2016-05-25 (×2): 2 mg via INTRAVENOUS
  Administered 2016-05-25 (×4): 4 mg via INTRAVENOUS
  Administered 2016-05-26 (×6): 2 mg via INTRAVENOUS
  Administered 2016-05-26 (×2): 4 mg via INTRAVENOUS
  Filled 2016-05-24 (×4): qty 2
  Filled 2016-05-24: qty 1
  Filled 2016-05-24: qty 2
  Filled 2016-05-24 (×2): qty 1
  Filled 2016-05-24 (×2): qty 2
  Filled 2016-05-24 (×2): qty 1
  Filled 2016-05-24: qty 2
  Filled 2016-05-24: qty 1
  Filled 2016-05-24 (×2): qty 2
  Filled 2016-05-24 (×2): qty 1
  Filled 2016-05-24: qty 2
  Filled 2016-05-24: qty 1

## 2016-05-24 MED ORDER — IOPAMIDOL (ISOVUE-300) INJECTION 61%: 100.0000 mL | Freq: Once | INTRAVENOUS | Status: DC | PRN

## 2016-05-24 MED ORDER — IOPAMIDOL (ISOVUE-300) INJECTION 61%
INTRAVENOUS | Status: AC
  Administered 2016-05-24: 110 mL
  Filled 2016-05-24: qty 100

## 2016-05-24 NOTE — ED Triage Notes (Signed)
Per [facility], pt at work wearing helmet on scaffolding, pt fell 9 feet and landed on his back, unable to get up off the ground. Pt wearing C collar, and c/o lower abdominal pain and back pain. Pt vss.

## 2016-05-24 NOTE — Sedation Documentation (Signed)
Patient denies pain and is resting comfortably.  

## 2016-05-24 NOTE — Progress Notes (Signed)
Binder removed, no changes in symptoms or vitals  Gurney Maxin, M.D. [facility] Pg: [phone]

## 2016-05-24 NOTE — Progress Notes (Signed)
Orthopedic Tech Progress Note Patient Details:  Corey Sellers [birth year] [id number] Called [employer] for brace order. Patient ID: SMAUEL HORACEK, male   DOB: 09/07/1951, 65 y.o.   MRN: [id number]   [name] 05/24/2016, 7:24 PM

## 2016-05-24 NOTE — ED Provider Notes (Addendum)
[facility] DEPT Provider Note   CSN: [id number] Arrival date & time: 05/24/16  1236     History   Chief Complaint Chief Complaint  Patient presents with  . Fall    HPI Corey Sellers is a 65 y.o. male.  HPI patient fell 9 feet off of scaffolding injuring his lower back. He he states his low back fell onto an air compressor. He complains of low nonradiating back pain and suprapubic pain since the event. He was brought by EMS on long board with hard collar and CID treated with fentanyl 200 g IV in the field. Pain is constant. Severe. Not made better or worse by anything. Denies abdominal pain. No other associated symptoms. No neck pain. Contact lens out.  Past Medical History:  Diagnosis Date  . Diabetes mellitus   . GERD (gastroesophageal reflux disease)   . Hypertension   . 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 Wears glasses     There are no active problems to display for this patient.   Past Surgical History:  Procedure Laterality Date  . APPENDECTOMY    . BASAL CELL CARCINOMA EXCISION     left chin  . EXCISION METACARPAL MASS Right 05/23/2013   Procedure: RIGHT LONG EXCISION ANNULAR LIGAMENT CYST;  Surgeon: Tennis Must, MD;  Location: [place];  Service: Orthopedics;  Laterality: Right;  . GANGLION CYST EXCISION Left 06/06/2013   Procedure: LEFT WRIST EXCISION MASS;  Surgeon: Tennis Must, MD;  Location: [place];  Service: Orthopedics;  Laterality: Left;  . JOINT REPLACEMENT  2003,2004   Rt shoulder  . PAROTIDECTOMY W/ NECK DISSECTION TOTAL  2013   left parotidectomy-26 nodes removed  . TONSILLECTOMY    . TRIGGER FINGER RELEASE Right 05/23/2013   Procedure: RELEASE TRIGGER FINGER/A-1 PULLEY RIGHT INDEX AND LONG;  Surgeon: Tennis Must, MD;  Location: [place];  Service: Orthopedics;  Laterality: Right;  . TRIGGER FINGER RELEASE Left 06/06/2013   Procedure:  RELEASE TRIGGER FINGER/A-1 PULLEY LEFT SMALL;  Surgeon: Tennis Must, MD;  Location: [place];  Service: Orthopedics;  Laterality: Left;  Marland Kitchen VARICOCELE EXCISION     left       Home Medications    Prior to Admission medications   Medication Sig Start Date End Date Taking? Authorizing Provider  aspirin EC 81 MG tablet Take 81 mg by mouth every evening.    Historical Provider, MD  Cholecalciferol (VITAMIN D-3 PO) Take by mouth.    Historical Provider, MD  etodolac (LODINE) 400 MG tablet Take 400 mg by mouth 2 (two) times daily.    Historical Provider, MD  HYDROcodone-acetaminophen [facility]) 5-325 MG per tablet 1-2 tabs po q6 hours prn pain 05/23/13   Leanora Cover, MD  HYDROcodone-acetaminophen [facility]) 5-325 MG per tablet 1-2 tabs po q6 hours prn pain 12/28/13   Calvert Cantor, MD  Multiple Vitamin (MULTIVITAMIN WITH MINERALS) TABS Take 1 tablet by mouth daily.    Historical Provider, MD  Olmesartan-Amlodipine-HCTZ (TRIBENZOR) 40-10-25 MG TABS Take 1 tablet by mouth every evening.    Historical Provider, MD  omeprazole (PRILOSEC) 20 MG capsule Take 20 mg by mouth daily.    Historical Provider, MD  rosuvastatin (CRESTOR) 40 MG tablet Take 20 mg by mouth every evening.    Historical Provider, MD    Family History No family history on file.  Social History Social History  Substance Use Topics  . Smoking status: Former Smoker    Quit date: 05/18/1999  . Smokeless tobacco: Not on file  . Alcohol use Yes     Comment: occ     Allergies   Other   Review of Systems Review of Systems  Constitutional: Negative.   HENT: Negative.   Respiratory: Negative.   Cardiovascular: Positive for chest pain.       Syncope  Gastrointestinal: Negative.   Musculoskeletal: Positive for back pain.  Skin: Negative.   Allergic/Immunologic: Positive for immunocompromised state.       Diabetic  Neurological: Negative.   Psychiatric/Behavioral: Negative.   All other systems reviewed and  are negative.    Physical Exam Updated Vital Signs BP 118/72 (BP Location: Left Arm)   Pulse 111   Temp 98.1 F (36.7 C)   Resp 16   SpO2 94%   Physical Exam  Constitutional: He appears well-developed and well-nourished. He appears distressed.  Appears uncomfortable Glasgow Coma Score 15  HENT:  Head: Normocephalic and atraumatic.  Eyes: Conjunctivae are normal. Pupils are equal, round, and reactive to light.  Neck: Neck supple. No tracheal deviation present. No thyromegaly present.  Cardiovascular: Normal rate and regular rhythm.   No murmur heard. Pulmonary/Chest: Effort normal and breath sounds normal.  Abdominal: Soft. Bowel sounds are normal. He exhibits no distension. There is no tenderness.  3 cm purplish ecchymosis over central abdomen slightly to left of miidline.  Genitourinary: Penis normal.  Genitourinary Comments: No scrotal hematoma  Musculoskeletal: Normal range of motion. He exhibits no edema or tenderness.  Cervical and thoracic spine nontender. All 4 extremities no contusion abrasion or tenderness neurovascularly intact. He has tenderness over paralumbar and parasacral areas. He is also tender over the pubic symphysis.  Neurological: He is alert. No cranial nerve deficit. He exhibits normal muscle tone. Coordination normal.  Moves all extremities motor strength 5 over 5 overall  Skin: Skin is warm and dry. No rash noted.  Psychiatric: He has a normal mood and affect.  Nursing note and vitals reviewed.    ED Treatments / Results  Labs (all labs ordered are listed, but only abnormal results are displayed) Labs Reviewed  CBC  CDS SEROLOGY  ETHANOL  PROTIME-INR  URINALYSIS, ROUTINE W REFLEX MICROSCOPIC (NOT AT [facility])  I-STAT CHEM 8, ED  SAMPLE TO BLOOD BANK    EKG  EKG Interpretation None       Radiology No results found.  Procedures Procedures (including critical care time)  Medications Ordered in ED Medications  morphine 4 MG/ML  injection 6 mg (6 mg Intravenous Given 05/24/16 1302)    Portable x-rays viewed by me. Pelvic x-ray consistent with pelvic diastases Initial Impression / Assessment and Plan / ED Course  I have reviewed the triage vital signs and the nursing notes.  Pertinent labs & imaging results that were available during my care of the patient were reviewed by me and considered in my medical decision making (see chart for details).  Clinical Course   1315 p.m. patient became hypotensive with blood pressure 84/50. Normal saline 1 L IV ordered. Level I TRAUMA ALERT called.Dr Kieth Brightly arrived and he placed pelvic binder on patient. 2:50 PM pain control after treatment with intravenous opioids. Patient hemodynamically stable after treatment with intravenous fluids. I discussed CT scan with radiologist. Dr. Kieth Brightly recontact but by me. He is in operating room he requests embolization by interventional radiology. I consulted Dr. Jarvis Newcomer from radiology department. Intravenous embolization ordered by  me. Patient will be admitted to trauma service  X-rays viewed by me Results for orders placed or performed during the hospital encounter of 05/24/16  CBC  Result Value Ref Range   WBC 11.0 (H) 4.0 - 10.5 K/uL   RBC 4.65 4.22 - 5.81 MIL/uL   Hemoglobin 13.6 13.0 - 17.0 g/dL   HCT 41.0 39.0 - 52.0 %   MCV 88.2 78.0 - 100.0 fL   MCH 29.2 26.0 - 34.0 pg   MCHC 33.2 30.0 - 36.0 g/dL   RDW 13.4 11.5 - 15.5 %   Platelets 189 150 - 400 K/uL  Ethanol  Result Value Ref Range   Alcohol, Ethyl (B) <5 <5 mg/dL  Urinalysis, Routine w reflex microscopic (not at [facility])  Result Value Ref Range   Color, Urine YELLOW YELLOW   APPearance CLEAR CLEAR   Specific Gravity, Urine >1.046 (H) 1.005 - 1.030   pH 6.5 5.0 - 8.0   Glucose, UA NEGATIVE NEGATIVE mg/dL   Hgb urine dipstick NEGATIVE NEGATIVE   Bilirubin Urine LARGE (A) NEGATIVE   Ketones, ur 15 (A) NEGATIVE mg/dL   Protein, ur NEGATIVE NEGATIVE mg/dL   Nitrite  NEGATIVE NEGATIVE   Leukocytes, UA NEGATIVE NEGATIVE  Comprehensive metabolic panel  Result Value Ref Range   Sodium 138 135 - 145 mmol/L   Potassium 3.8 3.5 - 5.1 mmol/L   Chloride 107 101 - 111 mmol/L   CO2 22 22 - 32 mmol/L   Glucose, Bld 158 (H) 65 - 99 mg/dL   BUN 22 (H) 6 - 20 mg/dL   Creatinine, Ser 1.20 0.61 - 1.24 mg/dL   Calcium 9.5 8.9 - 10.3 mg/dL   Total Protein 7.0 6.5 - 8.1 g/dL   Albumin 4.1 3.5 - 5.0 g/dL   AST 60 (H) 15 - 41 U/L   ALT 65 (H) 17 - 63 U/L   Alkaline Phosphatase 75 38 - 126 U/L   Total Bilirubin 0.8 0.3 - 1.2 mg/dL   GFR calc non Af Amer >60 >60 mL/min   GFR calc Af Amer >60 >60 mL/min   Anion gap 9 5 - 15  Protime-INR  Result Value Ref Range   Prothrombin Time 14.6 11.4 - 15.2 seconds   INR 1.13   I-Stat Chem 8, ED  Result Value Ref Range   Sodium 142 135 - 145 mmol/L   Potassium 3.9 3.5 - 5.1 mmol/L   Chloride 105 101 - 111 mmol/L   BUN 25 (H) 6 - 20 mg/dL   Creatinine, Ser 0.90 0.61 - 1.24 mg/dL   Glucose, Bld 159 (H) 65 - 99 mg/dL   Calcium, Ion 1.15 1.12 - 1.23 mmol/L   TCO2 26 0 - 100 mmol/L   Hemoglobin 13.9 13.0 - 17.0 g/dL   HCT 41.0 39.0 - 52.0 %  Type and screen  Result Value Ref Range   ABO/RH(D) B POS    Antibody Screen NEG    Sample Expiration 05/27/2016    Unit Number [id number]    Blood Component Type RBC LR PHER1    Unit division 00    Status of Unit ISSUED    Unit tag comment VERBAL ORDERS PER DR JACOBUWITZ    Transfusion Status OK TO TRANSFUSE    Crossmatch Result PENDING    Unit Number [id number]    Blood Component Type RED CELLS,LR    Unit division 00    Status of Unit ISSUED    Unit tag comment VERBAL ORDERS PER DR Milagros Evener  Transfusion Status OK TO TRANSFUSE    Crossmatch Result PENDING   Prepare fresh frozen plasma  Result Value Ref Range   Unit Number [id number]    Blood Component Type LIQ PLASMA    Unit division 00    Status of Unit ISSUED    Unit tag comment VERBAL ORDERS PER DR  JACOBUWITZ    Transfusion Status OK TO TRANSFUSE    Unit Number [id number]    Blood Component Type LIQ PLASMA    Unit division 00    Status of Unit ISSUED    Unit tag comment VERBAL ORDERS PER DR JACOBUWITZ    Transfusion Status OK TO TRANSFUSE   ABO/Rh  Result Value Ref Range   ABO/RH(D) B POS    Ct Abdomen Pelvis W Contrast  Result Date: 05/24/2016 CLINICAL DATA:  Fall. EXAM: CT ABDOMEN AND PELVIS WITH CONTRAST TECHNIQUE: Multidetector CT imaging of the abdomen and pelvis was performed using the standard protocol following bolus administration of intravenous contrast. CONTRAST:  100 cc of Isovue-300 COMPARISON:  None. FINDINGS: Lower chest:  No acute findings. Hepatobiliary: Diffuse hepatic steatosis. No focal liver abnormality. The gallbladder is normal. No biliary dilatation. Pancreas: No mass, inflammatory changes, or other significant abnormality. Spleen: Within normal limits in size and appearance. Adrenals/Urinary Tract: Normal appearance of the right adrenal gland. 1.4 cm left adrenal gland nodule is identified. This is unchanged from previous exam and likely represents a benign adenoma. Normal appearance of both kidneys. The urinary bladder appears normal. Stomach/Bowel: The stomach is within normal limits. The small bowel loops have a normal course and caliber. No obstruction. Normal appearance of the colon. Vascular/Lymphatic: Aortic atherosclerosis noted. No enlarged retroperitoneal or mesenteric adenopathy. No enlarged pelvic or inguinal lymph nodes. Reproductive: No mass or other significant abnormality. Other: There is a large hematoma identified within the ventral aspect of the pelvis. This measures 9.7 x 6.5 x 6.6 cm. There is evidence of active extravasation into the hematoma, image 90 of series 2. Musculoskeletal: Diane stasis of the pubic symphysis is noted. Fracture involving the right sacral wing is identified, image 72 of series 2. The inferior pubic rami are not imaged.  There is a new mild superior endplate fracture deformity identified involving the L3 vertebra. IMPRESSION: 1. Pelvic hematoma. There is evidence of active extravasation into the hematoma. 2. Pubic symphysis diastases 3. Right sacral wing fracture. 4. Superior endplate fracture deformity involves the L3 vertebra 5. These results were called by telephone at the time of interpretation on 05/24/2016 at 2:28 pm to Dr. Orlie Dakin , who verbally acknowledged these results. Electronically Signed   By: Kerby Moors M.D.   On: 05/24/2016 14:28   Dg Pelvis Portable  Result Date: 05/24/2016 CLINICAL DATA:  Fall 9 feet onto back. EXAM: PORTABLE PELVIS 1-2 VIEWS COMPARISON:  PET CT 04/27/2012 FINDINGS: There is diastasis of the pubic symphysis. This is new since prior PET CT. SI joints appear symmetric. No visible fracture. Early degenerative changes in the hip joints bilaterally. 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 Electronically Signed   By: Rolm Baptise M.D.   On: 05/24/2016 13:11   Dg Chest Portable 1 View  Result Date: 05/24/2016 CLINICAL DATA:  Fall 9 feet onto back. Bilateral hip pain. Pelvic fracture. EXAM: PORTABLE CHEST 1 VIEW COMPARISON:  07/30/2004 FINDINGS: Heart and mediastinal contours are within normal limits. No focal opacities or effusions. No acute bony abnormality. IMPRESSION: No active disease. Electronically Signed   By: Rolm Baptise  M.D.   On: 05/24/2016 13:12   Final Clinical Impressions(s) / ED Diagnoses  Diagnosis #1 fall #2 pelvic fracture with hematoma and extravasation of blood #3 lumbar compression fracture # 4 hyperglycemia Final diagnoses:  None   CRITICAL CARE Performed by: Orlie Dakin Total critical care time: 45 minutes Critical care time was exclusive of separately billable procedures and treating other patients. Critical care was necessary to treat or prevent imminent or life-threatening deterioration. Critical care was time spent  personally by me on the following activities: development of treatment plan with patient and/or surrogate as well as nursing, discussions with consultants, evaluation of patient's response to treatment, examination of patient, obtaining history from patient or surrogate, ordering and performing treatments and interventions, ordering and review of laboratory studies, ordering and review of radiographic studies, pulse oximetry and re-evaluation of patient's condition. New Prescriptions New Prescriptions   No medications on file     Orlie Dakin, MD 05/24/16 [place], MD 05/24/16 [phone]

## 2016-05-24 NOTE — ED Notes (Signed)
Pt denies LOC, wearing helmet, remembers all of the fall.

## 2016-05-24 NOTE — Procedures (Signed)
L internal iliac artery gelfoam (resorbable) embolization No complication No blood loss. See complete dictation in [facility].

## 2016-05-24 NOTE — ED Notes (Signed)
Pt transported to CT with RN

## 2016-05-24 NOTE — ED Notes (Signed)
Pt taken to IR

## 2016-05-24 NOTE — Progress Notes (Signed)
Chaplain responded to level 1 trauma page for pt two fell nine feet. Talked briefly with pt's wife at door of Trauma A. She said pt was working on a [employer] when he fell. I found a chair elsewhere and brought it so pt's wife could sit.

## 2016-05-24 NOTE — ED Notes (Signed)
X-ray at bedside

## 2016-05-24 NOTE — Sedation Documentation (Addendum)
Patient denies pain and is resting comfortably.  

## 2016-05-24 NOTE — ED Notes (Signed)
RN and pt returned from [place]. Continued monitoring.

## 2016-05-24 NOTE — ED Notes (Signed)
IR RN at bedside to transport pt to IR. Report given. Pt's wife given all pt's belongings to take with her to IR waiting room.

## 2016-05-24 NOTE — H&P (Signed)
History   Corey Sellers is an 65 y.o. male.   Chief Complaint:  Chief Complaint  Patient presents with  . Fall   65 yo male was up 8-40f off the ground and fell landing on his back over a canister. He complains of lower back pain. He denies loss of consciousness. No numbness or tingling. He had previous pelvic fractures and has chronic lower back pain.  Fall  Pertinent negatives include no abdominal pain, chest pain, chills, coughing, fever, headaches, myalgias, nausea, neck pain, rash or vomiting.    Past Medical History:  Diagnosis Date  . Diabetes mellitus   . GERD (gastroesophageal reflux disease)   . Hypertension   . [place]   . [facility] [facility]  . Wears glasses     Past Surgical History:  Procedure Laterality Date  . APPENDECTOMY    . BASAL CELL CARCINOMA EXCISION     left chin  . EXCISION METACARPAL MASS Right 05/23/2013   Procedure: RIGHT LONG EXCISION ANNULAR LIGAMENT CYST;  Surgeon: KTennis Must MD;  [place] [facility]  Service: [place];  Laterality: Right;  . GANGLION CYST EXCISION Left 06/06/2013   Procedure: LEFT WRIST EXCISION MASS;  Surgeon: KTennis Must MD;  [facility]  Service: Orthopedics;  Laterality: Left;  . JOINT REPLACEMENT  2003,2004   Rt shoulder  . PAROTIDECTOMY W/ NECK DISSECTION TOTAL  2013   left parotidectomy-26 nodes removed  . TONSILLECTOMY    . TRIGGER FINGER RELEASE Right 05/23/2013   Procedure: RELEASE TRIGGER FINGER/A-1 PULLEY RIGHT INDEX AND LONG;  Surgeon: KTennis Must MD;  [place]  Service: [place]ics;  Laterality: Right;  . TRIGGER FINGER RELEASE Left 06/06/2013   Procedure: RELEASE TRIGGER FINGER/A-1 PULLEY LEFT SMALL;  Surgeon: KTennis Must MD;  [place]  Service: [place]opedics;  Laterality: Left;  .Marland KitchenVARICOCELE EXCISION     left    No family history on file. Social  History:  reports that he quit smoking about 17 years ago. He does not have any smokeless tobacco history on file. He reports that he drinks alcohol. He reports that he does not use drugs.  Allergies   Allergies  Allergen Reactions  . Bee Venom Anaphylaxis  . Other Rash and Other (See Comments)    METALS. Patient has metal sensitivity, including metal sutures.  Causes severe irritation. Metal sensitivity - redness and irritation at site when staples used in incision closure    Home Medications   (Not in a hospital admission)  Trauma Course   Results for orders placed or performed during the hospital encounter of 05/24/16 (from the past 48 hour(s))  Protime-INR     Status: None   Collection Time: 05/24/16 12:16 PM  Result Value Ref Range   Prothrombin Time 14.6 11.4 - 15.2 seconds   INR 1.13   CBC     Status: Abnormal   Collection Time: 05/24/16 12:55 PM  Result Value Ref Range   WBC 11.0 (H) 4.0 - 10.5 K/uL   RBC 4.65 4.22 - 5.81 MIL/uL   Hemoglobin 13.6 13.0 - 17.0 g/dL   HCT 41.0 39.0 - 52.0 %   MCV 88.2 78.0 - 100.0 fL   MCH 29.2 26.0 - 34.0 pg   MCHC 33.2 30.0 - 36.0 g/dL   RDW 13.4 11.5 - 15.5 %   Platelets 189 150 - 400 K/uL  Comprehensive metabolic panel     Status: Abnormal   Collection Time: 05/24/16 12:55 PM  Result Value Ref Range   Sodium 138 135 - 145 mmol/L   Potassium 3.8 3.5 - 5.1 mmol/L   Chloride 107 101 - 111 mmol/L   CO2 22 22 - 32 mmol/L   Glucose, Bld 158 (H) 65 - 99 mg/dL   BUN 22 (H) 6 - 20 mg/dL   Creatinine, Ser 1.20 0.61 - 1.24 mg/dL   Calcium 9.5 8.9 - 10.3 mg/dL   Total Protein 7.0 6.5 - 8.1 g/dL   Albumin 4.1 3.5 - 5.0 g/dL   AST 60 (H) 15 - 41 U/L   ALT 65 (H) 17 - 63 U/L   Alkaline Phosphatase 75 38 - 126 U/L   Total Bilirubin 0.8 0.3 - 1.2 mg/dL   GFR calc non Af Amer >60 >60 mL/min   GFR calc Af Amer >60 >60 mL/min    Comment: (NOTE) The eGFR has been calculated using the CKD EPI equation. This calculation has not been  validated in all clinical situations. eGFR's persistently <60 mL/min signify possible Chronic Kidney Disease.    Anion gap 9 5 - 15  Ethanol     Status: None   Collection Time: 05/24/16  1:16 PM  Result Value Ref Range   Alcohol, Ethyl (B) <5 <5 mg/dL    Comment:        LOWEST DETECTABLE LIMIT FOR SERUM ALCOHOL IS 5 mg/dL FOR MEDICAL PURPOSES ONLY   Prepare fresh frozen plasma     Status: None (Preliminary result)   Collection Time: 05/24/16  1:19 PM  Result Value Ref Range   Unit Number [id number]    Blood Component Type LIQ PLASMA    Unit division 00    Status of Unit ISSUED    Unit tag comment VERBAL ORDERS PER DR JACOBUWITZ    Transfusion Status OK TO TRANSFUSE    Unit Number [id number]    Blood Component Type LIQ PLASMA    Unit division 00    Status of Unit ISSUED    Unit tag comment VERBAL ORDERS PER DR JACOBUWITZ    Transfusion Status OK TO TRANSFUSE   I-Stat Chem 8, ED     Status: Abnormal   Collection Time: 05/24/16  1:20 PM  Result Value Ref Range   Sodium 142 135 - 145 mmol/L   Potassium 3.9 3.5 - 5.1 mmol/L   Chloride 105 101 - 111 mmol/L   BUN 25 (H) 6 - 20 mg/dL   Creatinine, Ser 0.90 0.61 - 1.24 mg/dL   Glucose, Bld 159 (H) 65 - 99 mg/dL   Calcium, Ion 1.15 1.12 - 1.23 mmol/L   TCO2 26 0 - 100 mmol/L   Hemoglobin 13.9 13.0 - 17.0 g/dL   HCT 41.0 39.0 - 52.0 %  Type and screen     Status: None (Preliminary result)   Collection Time: 05/24/16  1:36 PM  Result Value Ref Range   ABO/RH(D) B POS    Antibody Screen NEG    Sample Expiration 05/27/2016    Unit Number [id number]    Blood Component Type RBC LR PHER1    Unit division 00    Status of Unit ISSUED    Unit tag comment VERBAL ORDERS PER DR JACOBUWITZ    Transfusion Status OK TO TRANSFUSE    Crossmatch Result PENDING    Unit Number [id number]    Blood Component Type RED CELLS,LR    Unit division  00    Status of Unit ISSUED    Unit tag comment VERBAL ORDERS PER DR JACOBUWITZ     Transfusion Status OK TO TRANSFUSE    Crossmatch Result PENDING   ABO/Rh     Status: None (Preliminary result)   Collection Time: 05/24/16  1:36 PM  Result Value Ref Range   ABO/RH(D) B POS   Urinalysis, Routine w reflex microscopic (not at [facility])     Status: Abnormal   Collection Time: 05/24/16  2:22 PM  Result Value Ref Range   Color, Urine YELLOW YELLOW   APPearance CLEAR CLEAR   Specific Gravity, Urine >1.046 (H) 1.005 - 1.030   pH 6.5 5.0 - 8.0   Glucose, UA NEGATIVE NEGATIVE mg/dL   Hgb urine dipstick NEGATIVE NEGATIVE   Bilirubin Urine LARGE (A) NEGATIVE   Ketones, ur 15 (A) NEGATIVE mg/dL   Protein, ur NEGATIVE NEGATIVE mg/dL   Nitrite NEGATIVE NEGATIVE   Leukocytes, UA NEGATIVE NEGATIVE    Comment: MICROSCOPIC NOT DONE ON URINES WITH NEGATIVE PROTEIN, BLOOD, LEUKOCYTES, NITRITE, OR GLUCOSE <1000 mg/dL.   Ct Abdomen Pelvis W Contrast  Result Date: 05/24/2016 CLINICAL DATA:  Fall. EXAM: CT ABDOMEN AND PELVIS WITH CONTRAST TECHNIQUE: Multidetector CT imaging of the abdomen and pelvis was performed using the standard protocol following bolus administration of intravenous contrast. CONTRAST:  100 cc of Isovue-300 COMPARISON:  None. FINDINGS: Lower chest:  No acute findings. Hepatobiliary: Diffuse hepatic steatosis. No focal liver abnormality. The gallbladder is normal. No biliary dilatation. Pancreas: No mass, inflammatory changes, or other significant abnormality. Spleen: Within normal limits in size and appearance. Adrenals/Urinary Tract: Normal appearance of the right adrenal gland. 1.4 cm left adrenal gland nodule is identified. This is unchanged from previous exam and likely represents a benign adenoma. Normal appearance of both kidneys. The urinary bladder appears normal. Stomach/Bowel: The stomach is within normal limits. The small bowel loops have a normal course and caliber. No obstruction. Normal appearance of the colon. Vascular/Lymphatic: Aortic atherosclerosis noted. No  enlarged retroperitoneal or mesenteric adenopathy. No enlarged pelvic or inguinal lymph nodes. Reproductive: No mass or other significant abnormality. Other: There is a large hematoma identified within the ventral aspect of the pelvis. This measures 9.7 x 6.5 x 6.6 cm. There is evidence of active extravasation into the hematoma, image 90 of series 2. Musculoskeletal: Diane stasis of the pubic symphysis is noted. Fracture involving the right sacral wing is identified, image 72 of series 2. The inferior pubic rami are not imaged. There is a new mild superior endplate fracture deformity identified involving the L3 vertebra. IMPRESSION: 1. Pelvic hematoma. There is evidence of active extravasation into the hematoma. 2. Pubic symphysis diastases 3. Right sacral wing fracture. 4. Superior endplate fracture deformity involves the L3 vertebra 5. These results were called by telephone at the time of interpretation on 05/24/2016 at 2:28 pm to Dr. Orlie Dakin , who verbally acknowledged these results. Electronically Signed   By: Kerby Moors M.D.   On: 05/24/2016 14:28   Dg Pelvis Portable  Result Date: 05/24/2016 CLINICAL DATA:  Fall 9 feet onto back. EXAM: PORTABLE PELVIS 1-2 VIEWS COMPARISON:  PET CT 04/27/2012 FINDINGS: There is diastasis of the pubic symphysis. This is new since prior PET CT. SI joints appear symmetric. No visible fracture. Early degenerative changes in the hip joints bilaterally. 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 Electronically Signed   By: Rolm Baptise M.D.   On: 05/24/2016 13:11   Dg Chest  Portable 1 View  Result Date: 05/24/2016 CLINICAL DATA:  Fall 9 feet onto back. Bilateral hip pain. Pelvic fracture. EXAM: PORTABLE CHEST 1 VIEW COMPARISON:  07/30/2004 FINDINGS: Heart and mediastinal contours are within normal limits. No focal opacities or effusions. No acute bony abnormality. IMPRESSION: No active disease. Electronically Signed   By: Rolm Baptise M.D.   On:  05/24/2016 13:12    Review of Systems  Constitutional: Negative for chills and fever.  HENT: Negative for hearing loss.   Eyes: Negative for blurred vision and double vision.  Respiratory: Negative for cough and hemoptysis.   Cardiovascular: Negative for chest pain and palpitations.  Gastrointestinal: Negative for abdominal pain, nausea and vomiting.  Genitourinary: Negative for dysuria and urgency.  Musculoskeletal: Positive for back pain. Negative for myalgias and neck pain.  Skin: Negative for itching and rash.  Neurological: Negative for dizziness, tingling and headaches.  Endo/Heme/Allergies: Does not bruise/bleed easily.  Psychiatric/Behavioral: Negative for depression and suicidal ideas.    Blood pressure 126/67, pulse 85, temperature 98.1 F (36.7 C), resp. rate 23, SpO2 93 %. Physical Exam  Nursing note and vitals reviewed. Constitutional: He is oriented to person, place, and time. He appears well-developed and well-nourished.  HENT:  Head: Normocephalic and atraumatic.  Eyes: Conjunctivae and EOM are normal. No scleral icterus.  Neck: Normal range of motion. Neck supple.  Cardiovascular: Normal rate and regular rhythm.   Respiratory: Effort normal and breath sounds normal. He has no wheezes. He has no rales. He exhibits no tenderness.  GI: Soft. He exhibits no distension. There is tenderness. There is no rebound.  Musculoskeletal: Normal range of motion. He exhibits no edema.  Neurological: He is alert and oriented to person, place, and time.  Skin: Skin is warm and dry.  Psychiatric: He has a normal mood and affect. His behavior is normal.     Assessment/Plan 65 yo male with fall 8-10 feet findings of pelvic diastasis with pelvic hematoma with active blush, R sacral fx, L3 superior end plate fracture -NPo -IR for angio and possible embolization -admit to ICU  Arta Bruce Kora Groom 05/24/2016, 3:49 PM   Procedures

## 2016-05-24 NOTE — ED Notes (Addendum)
Upgraded to Level 1 trauma

## 2016-05-24 NOTE — ED Notes (Signed)
Pelvic binder placed by MD

## 2016-05-25 ENCOUNTER — Encounter (HOSPITAL_COMMUNITY): Payer: Self-pay | Admitting: Neurological Surgery

## 2016-05-25 LAB — BASIC METABOLIC PANEL
Anion gap: 6 (ref 5–15)
BUN: 18 mg/dL (ref 6–20)
CALCIUM: 8.4 mg/dL — AB (ref 8.9–10.3)
CO2: 25 mmol/L (ref 22–32)
CREATININE: 0.9 mg/dL (ref 0.61–1.24)
Chloride: 109 mmol/L (ref 101–111)
GFR calc Af Amer: >60 mL/min (ref >60)
GLUCOSE: 171 mg/dL — AB (ref 65–99)
Potassium: 3.6 mmol/L (ref 3.5–5.1)
Sodium: 140 mmol/L (ref 135–145)

## 2016-05-25 LAB — CBC
HCT: 33.5 % — ABNORMAL LOW (ref 39.0–52.0)
HEMATOCRIT: 34.9 % — AB (ref 39.0–52.0)
HEMOGLOBIN: 11.2 g/dL — AB (ref 13.0–17.0)
Hemoglobin: 10.8 g/dL — ABNORMAL LOW (ref 13.0–17.0)
MCH: 28.9 pg (ref 26.0–34.0)
MCH: 29 pg (ref 26.0–34.0)
MCHC: 32.1 g/dL (ref 30.0–36.0)
MCHC: 32.2 g/dL (ref 30.0–36.0)
MCV: 89.9 fL (ref 78.0–100.0)
MCV: 90.1 fL (ref 78.0–100.0)
PLATELETS: 156 10*3/uL (ref 150–400)
Platelets: 144 10*3/uL — ABNORMAL LOW (ref 150–400)
RBC: 3.88 MIL/uL — ABNORMAL LOW (ref 4.22–5.81)
RBC: 3.72 MIL/uL — AB (ref 4.22–5.81)
RDW: 14 % (ref 11.5–15.5)
RDW: 14 % (ref 11.5–15.5)
WBC: 9 10*3/uL (ref 4.0–10.5)
WBC: 10.2 10*3/uL (ref 4.0–10.5)

## 2016-05-25 LAB — GLUCOSE, CAPILLARY
GLUCOSE-CAPILLARY: 208 mg/dL — AB (ref 65–99)
Glucose-Capillary: 175 mg/dL — ABNORMAL HIGH (ref 65–99)
Glucose-Capillary: 210 mg/dL — ABNORMAL HIGH (ref 65–99)
Glucose-Capillary: 208 mg/dL — ABNORMAL HIGH (ref 65–99)

## 2016-05-25 MED ORDER — INSULIN ASPART 100 UNIT/ML ~~LOC~~ SOLN
0.0000 [IU] | Freq: Every day | SUBCUTANEOUS | Status: DC
  Administered 2016-05-25: 2 [IU] via SUBCUTANEOUS

## 2016-05-25 MED ORDER — INSULIN ASPART 100 UNIT/ML ~~LOC~~ SOLN
0.0000 [IU] | Freq: Three times a day (TID) | SUBCUTANEOUS | Status: DC
  Administered 2016-05-25: 5 [IU] via SUBCUTANEOUS
  Administered 2016-05-26 – 2016-05-27 (×4): 3 [IU] via SUBCUTANEOUS
  Administered 2016-05-27: 5 [IU] via SUBCUTANEOUS
  Administered 2016-05-28: 2 [IU] via SUBCUTANEOUS
  Administered 2016-05-28 – 2016-05-30 (×7): 3 [IU] via SUBCUTANEOUS

## 2016-05-25 MED ORDER — MAGNESIUM CITRATE PO SOLN
0.5000 | Freq: Once | ORAL | Status: AC
  Administered 2016-05-25: 0.5 via ORAL
  Filled 2016-05-25: qty 296

## 2016-05-25 MED ORDER — OXYCODONE HCL 5 MG PO TABS
5.0000 mg | ORAL_TABLET | ORAL | Status: DC | PRN
  Administered 2016-05-25 – 2016-05-26 (×5): 10 mg via ORAL
  Administered 2016-05-26: 5 mg via ORAL
  Administered 2016-05-26: 10 mg via ORAL
  Filled 2016-05-25: qty 2
  Filled 2016-05-25: qty 1
  Filled 2016-05-25 (×6): qty 2

## 2016-05-25 MED ORDER — BISACODYL 10 MG RE SUPP
10.0000 mg | Freq: Once | RECTAL | Status: AC
  Administered 2016-05-25: 10 mg via RECTAL
  Filled 2016-05-25: qty 1

## 2016-05-25 NOTE — Progress Notes (Signed)
Patient ID: Corey Sellers, male   DOB: 17-Jun-1951, 65 y.o.   MRN: [id number]    Subjective: Feels like he needs to have a BM. Requests mag citrate that he takes at home.  Objective: Vital signs in last 24 hours: Temp:  [98.1 F (36.7 C)-99 F (37.2 C)] 99 F (37.2 C) (08/27 0400) Pulse Rate:  [62-111] 102 (08/27 0800) Resp:  [9-26] 17 (08/27 0700) BP: (80-152)/(44-81) 136/59 (08/27 0800) SpO2:  [90 %-100 %] 93 % (08/27 0800) Weight:  [112 kg (247 lb)] 112 kg (247 lb) (08/26 1550) Last BM Date: 05/24/16  Intake/Output from previous day: 08/26 0701 - 08/27 0700 In: 2396.7 [I.V.:2396.7] Out: 800 [Urine:800] Intake/Output this shift: Total I/O In: 100 [I.V.:100] Out: 75 [Urine:75]  General appearance: cooperative Resp: clear to auscultation bilaterally Cardio: regular rate and rhythm GI: soft, NT, ND Neuro: MAE  Lab Results: CBC   Recent Labs  05/24/16 1255 05/24/16 1320 05/25/16 0338  WBC 11.0*  --  9.0  HGB 13.6 13.9 11.2*  HCT 41.0 41.0 34.9*  PLT 189  --  144*   BMET  Recent Labs  05/24/16 1255 05/24/16 1320 05/25/16 0338  NA 138 142 140  K 3.8 3.9 3.6  CL 107 105 109  CO2 22  --  25  GLUCOSE 158* 159* 171*  BUN 22* 25* 18  CREATININE 1.20 0.90 0.90  CALCIUM 9.5  --  8.4*   PT/INR  Recent Labs  05/24/16 1216  LABPROT 14.6  INR 1.13   ABG No results for input(s): PHART, HCO3 in the last 72 hours.  Invalid input(s): PCO2, PO2  Studies/Results: Ct Abdomen Pelvis W Contrast  Result Date: 05/24/2016 CLINICAL DATA:  Fall. EXAM: CT ABDOMEN AND PELVIS WITH CONTRAST TECHNIQUE: Multidetector CT imaging of the abdomen and pelvis was performed using the standard protocol following bolus administration of intravenous contrast. CONTRAST:  100 cc of Isovue-300 COMPARISON:  None. FINDINGS: Lower chest:  No acute findings. Hepatobiliary: Diffuse hepatic steatosis. No focal liver abnormality. The gallbladder is normal. No biliary dilatation. Pancreas: No  mass, inflammatory changes, or other significant abnormality. Spleen: Within normal limits in size and appearance. Adrenals/Urinary Tract: Normal appearance of the right adrenal gland. 1.4 cm left adrenal gland nodule is identified. This is unchanged from previous exam and likely represents a benign adenoma. Normal appearance of both kidneys. The urinary bladder appears normal. Stomach/Bowel: The stomach is within normal limits. The small bowel loops have a normal course and caliber. No obstruction. Normal appearance of the colon. Vascular/Lymphatic: Aortic atherosclerosis noted. No enlarged retroperitoneal or mesenteric adenopathy. No enlarged pelvic or inguinal lymph nodes. Reproductive: No mass or other significant abnormality. Other: There is a large hematoma identified within the ventral aspect of the pelvis. This measures 9.7 x 6.5 x 6.6 cm. There is evidence of active extravasation into the hematoma, image 90 of series 2. Musculoskeletal: Diane stasis of the pubic symphysis is noted. Fracture involving the right sacral wing is identified, image 72 of series 2. The inferior pubic rami are not imaged. There is a new mild superior endplate fracture deformity identified involving the L3 vertebra. IMPRESSION: 1. Pelvic hematoma. There is evidence of active extravasation into the hematoma. 2. Pubic symphysis diastases 3. Right sacral wing fracture. 4. Superior endplate fracture deformity involves the L3 vertebra 5. These results were called by telephone at the time of interpretation on 05/24/2016 at 2:28 pm to Dr. Orlie Dakin , who verbally acknowledged these results. Electronically Signed   By:  Kerby Moors M.D.   On: 05/24/2016 14:28   Ir Angiogram Pelvis Selective Or Supraselective  Result Date: 05/25/2016 INDICATION: [place]. Pelvic fracture. Active extravasation noted on CT. External pelvic fixation. Trauma surgery requests pelvic embolization. EXAM: PELVIC SELECTIVE ARTERIOGRAPHY; IR EMBO ART VEN HEMORR  LYMPH EXTRAV INC GUIDE ROADMAPPING; IR ULTRASOUND GUIDANCE VASC ACCESS RIGHT MEDICATIONS: No preprocedural antibiotics indicated. ANESTHESIA/SEDATION: Intravenous Fentanyl and Versed were administered as conscious sedation during continuous monitoring of the patient's level of consciousness and physiological / cardiorespiratory status by the radiology RN, with a total moderate sedation time of 54 minutes. CONTRAST:  1107mL ISOVUE-300 IOPAMIDOL (ISOVUE-300) INJECTION 61% PROCEDURE: Informed consent was obtained from the patient following explanation of the procedure, risks, benefits and alternatives. The patient understands, agrees and consents for the procedure. All questions were addressed. A time out was performed prior to the initiation of the procedure. Maximal barrier sterile technique utilized including caps, mask, sterile gowns, sterile gloves, large sterile drape, hand hygiene, and chlorhexidine prep. Patency of right common femoral artery confirmed with ultrasound and documented. Skin was infiltrated with 1% lidocaine. Under real-time ultrasound guidance, the vessel was accessed with a 21-gauge micropuncture needle, exchanged over a 018 guidewire for a transitional dilator, through which a 035 guidewire was advanced. 5 [place] vascular sheath placed. A 5 French C2 catheter advanced and used to catheterize the left common iliac artery for pelvic arteriography. The catheter was advanced into the internal iliac artery for additional second and third order arteriography attempts to localize the bleeding source. Ultimately, the extravasation could not be localized to a segmental branch, so the catheter with withdrawn into the distal main internal iliac artery for empiric embolization using resorbable Gel-Foam pledgets. The embolization performed to near stasis of flow. The catheter was flushed. catheter was then withdrawn into the common iliac artery for final left pelvic arteriography, demonstrating good  antegrade continued antegrade flow through the external iliac artery, near stasis in proximal anterior and posterior divisions with non opacification of distal branches. The C2 catheter was withdrawn. After final right localizing femoral arteriogram, the sheath was removed and hemostasis achieved with the aid of the Exoseal device. The patient tolerated the procedure well. FLUOROSCOPY TIME:  9 minutes 42 seconds [id number] mGy COMPLICATIONS: None immediate IMPRESSION: 1. Technically successful left internal iliac artery embolization using a resorbable agent. Electronically Signed   By: Lucrezia Europe M.D.   On: 05/25/2016 08:39   Dg Pelvis Portable  Result Date: 05/24/2016 CLINICAL DATA:  Fall 9 feet onto back. EXAM: PORTABLE PELVIS 1-2 VIEWS COMPARISON:  PET CT 04/27/2012 FINDINGS: There is diastasis of the pubic symphysis. This is new since prior PET CT. SI joints appear symmetric. No visible fracture. Early degenerative changes in the hip joints bilaterally. 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 Electronically Signed   By: Rolm Baptise M.D.   On: 05/24/2016 13:11   Ir US Guide Vasc Access Right  Result Date: 05/25/2016 INDICATION: [place]. Pelvic fracture. Active extravasation noted on CT. External pelvic fixation. Trauma surgery requests pelvic embolization. EXAM: PELVIC SELECTIVE ARTERIOGRAPHY; IR EMBO ART VEN HEMORR LYMPH EXTRAV INC GUIDE ROADMAPPING; IR ULTRASOUND GUIDANCE VASC ACCESS RIGHT MEDICATIONS: No preprocedural antibiotics indicated. ANESTHESIA/SEDATION: Intravenous Fentanyl and Versed were administered as conscious sedation during continuous monitoring of the patient's level of consciousness and physiological / cardiorespiratory status by the radiology RN, with a total moderate sedation time of 54 minutes. CONTRAST:  137mL ISOVUE-300 IOPAMIDOL (ISOVUE-300) INJECTION 61% PROCEDURE: Informed consent was obtained from the  patient following explanation of the procedure, risks,  benefits and alternatives. The patient understands, agrees and consents for the procedure. All questions were addressed. A time out was performed prior to the initiation of the procedure. Maximal barrier sterile technique utilized including caps, mask, sterile gowns, sterile gloves, large sterile drape, hand hygiene, and chlorhexidine prep. Patency of right common femoral artery confirmed with ultrasound and documented. Skin was infiltrated with 1% lidocaine. Under real-time ultrasound guidance, the vessel was accessed with a 21-gauge micropuncture needle, exchanged over a 018 guidewire for a transitional dilator, through which a 035 guidewire was advanced. 5 [place] vascular sheath placed. A 5 French C2 catheter advanced and used to catheterize the left common iliac artery for pelvic arteriography. The catheter was advanced into the internal iliac artery for additional second and third order arteriography attempts to localize the bleeding source. Ultimately, the extravasation could not be localized to a segmental branch, so the catheter with withdrawn into the distal main internal iliac artery for empiric embolization using resorbable Gel-Foam pledgets. The embolization performed to near stasis of flow. The catheter was flushed. catheter was then withdrawn into the common iliac artery for final left pelvic arteriography, demonstrating good antegrade continued antegrade flow through the external iliac artery, near stasis in proximal anterior and posterior divisions with non opacification of distal branches. The C2 catheter was withdrawn. After final right localizing femoral arteriogram, the sheath was removed and hemostasis achieved with the aid of the Exoseal device. The patient tolerated the procedure well. FLUOROSCOPY TIME:  9 minutes 42 seconds [id number] mGy COMPLICATIONS: None immediate IMPRESSION: 1. Technically successful left internal iliac artery embolization using a resorbable agent. Electronically Signed   By:  Lucrezia Europe M.D.   On: 05/25/2016 08:39   Dg Chest Portable 1 View  Result Date: 05/24/2016 CLINICAL DATA:  Fall 9 feet onto back. Bilateral hip pain. Pelvic fracture. EXAM: PORTABLE CHEST 1 VIEW COMPARISON:  07/30/2004 FINDINGS: Heart and mediastinal contours are within normal limits. No focal opacities or effusions. No acute bony abnormality. IMPRESSION: No active disease. Electronically Signed   By: Rolm Baptise M.D.   On: 05/24/2016 13:12   Ir Embo Art  [place] Guide Roadmapping  Result Date: 05/25/2016 INDICATION: [place]. Pelvic fracture. Active extravasation noted on CT. External pelvic fixation. Trauma surgery requests pelvic embolization. EXAM: PELVIC SELECTIVE ARTERIOGRAPHY; IR EMBO ART VEN HEMORR LYMPH EXTRAV INC GUIDE ROADMAPPING; IR ULTRASOUND GUIDANCE VASC ACCESS RIGHT MEDICATIONS: No preprocedural antibiotics indicated. ANESTHESIA/SEDATION: Intravenous Fentanyl and Versed were administered as conscious sedation during continuous monitoring of the patient's level of consciousness and physiological / cardiorespiratory status by the radiology RN, with a total moderate sedation time of 54 minutes. CONTRAST:  14mL ISOVUE-300 IOPAMIDOL (ISOVUE-300) INJECTION 61% PROCEDURE: Informed consent was obtained from the patient following explanation of the procedure, risks, benefits and alternatives. The patient understands, agrees and consents for the procedure. All questions were addressed. A time out was performed prior to the initiation of the procedure. Maximal barrier sterile technique utilized including caps, mask, sterile gowns, sterile gloves, large sterile drape, hand hygiene, and chlorhexidine prep. Patency of right common femoral artery confirmed with ultrasound and documented. Skin was infiltrated with 1% lidocaine. Under real-time ultrasound guidance, the vessel was accessed with a 21-gauge micropuncture needle, exchanged over a 018 guidewire for a transitional dilator,  through which a 035 guidewire was advanced. 5 [place] vascular sheath placed. A 5 French C2 catheter advanced and used to catheterize the left common iliac  artery for pelvic arteriography. The catheter was advanced into the internal iliac artery for additional second and third order arteriography attempts to localize the bleeding source. Ultimately, the extravasation could not be localized to a segmental branch, so the catheter with withdrawn into the distal main internal iliac artery for empiric embolization using resorbable Gel-Foam pledgets. The embolization performed to near stasis of flow. The catheter was flushed. catheter was then withdrawn into the common iliac artery for final left pelvic arteriography, demonstrating good antegrade continued antegrade flow through the external iliac artery, near stasis in proximal anterior and posterior divisions with non opacification of distal branches. The C2 catheter was withdrawn. After final right localizing femoral arteriogram, the sheath was removed and hemostasis achieved with the aid of the Exoseal device. The patient tolerated the procedure well. FLUOROSCOPY TIME:  9 minutes 42 seconds [id number] mGy COMPLICATIONS: None immediate IMPRESSION: 1. Technically successful left internal iliac artery embolization using a resorbable agent. Electronically Signed   By: Lucrezia Europe M.D.   On: 05/25/2016 08:39    Anti-infectives: Anti-infectives    None      Assessment/Plan: Fall Open book pelvic FX with sacral FX - Dr. Erlinda Hong consulting now, bedrest S/P angioembolization pelvic hemorrhage ABL anemia - due to above, CBC this PM and in AM L3 endplate FX - LSO when up per Dr. Ronnald Ramp VTE - PAS FEN - start clears [place] - ICU   LOS: 1 day    Georganna Skeans, MD, MPH, FACS Trauma: [phone] General Surgery: [phone]  05/25/2016

## 2016-05-25 NOTE — Consult Note (Signed)
ORTHOPAEDIC CONSULTATION  REQUESTING PHYSICIAN: Trauma Md, MD  Chief Complaint: pelvic ring injury  HPI: Corey Sellers is a 65 y.o. male who presents with pelvic ring injury s/p 10 ft fall from scaffold while building a house for [employer] yesterday.  Presented to ER and then had hypotension, was upgraded to Level I.  Trauma surgeon placed pelvic binder and had IR embolization.  I was then consulted for recommendations.  Pelvic binder was removed per my request and has been off for approximately 12 hrs and patient's vitals have been stable.  Patient endorses severe pain in pelvic region with any movement.  Pain does not radiate, it is constant.  Denies any paresthesias in legs.    Past Medical History:  Diagnosis Date  . Diabetes mellitus   . GERD (gastroesophageal reflux disease)   . Hypertension   . 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 Wears glasses    Past Surgical History:  Procedure Laterality Date  . APPENDECTOMY    . BASAL CELL CARCINOMA EXCISION     left chin  . EXCISION METACARPAL MASS Right 05/23/2013   Procedure: RIGHT LONG EXCISION ANNULAR LIGAMENT CYST;  Surgeon: Tennis Must, MD;  Location: [place];  Service: Orthopedics;  Laterality: Right;  . GANGLION CYST EXCISION Left 06/06/2013   Procedure: LEFT WRIST EXCISION MASS;  Surgeon: Tennis Must, MD;  Location: [place];  Service: Orthopedics;  Laterality: Left;  . IR GENERIC HISTORICAL  05/24/2016   IR ANGIOGRAM PELVIS SELECTIVE OR SUPRASELECTIVE 05/24/2016 Arne Cleveland, MD [facility]  . IR GENERIC HISTORICAL  05/24/2016   IR US GUIDE VASC ACCESS RIGHT 05/24/2016 Arne Cleveland, MD [facility]  . IR GENERIC HISTORICAL  05/24/2016   IR EMBO ART  VEN HEMORR LYMPH EXTRAV  INC GUIDE ROADMAPPING 05/24/2016 Arne Cleveland, MD [facility]  . JOINT REPLACEMENT  2003,2004   Rt shoulder  . PAROTIDECTOMY W/ NECK DISSECTION TOTAL   2013   left parotidectomy-26 nodes removed  . TONSILLECTOMY    . TRIGGER FINGER RELEASE Right 05/23/2013   Procedure: RELEASE TRIGGER FINGER/A-1 PULLEY RIGHT INDEX AND LONG;  Surgeon: Tennis Must, MD;  Location: [place];  Service: Orthopedics;  Laterality: Right;  . TRIGGER FINGER RELEASE Left 06/06/2013   Procedure: RELEASE TRIGGER FINGER/A-1 PULLEY LEFT SMALL;  Surgeon: Tennis Must, MD;  Location: [place];  Service: Orthopedics;  Laterality: Left;  Marland Kitchen VARICOCELE EXCISION     left   Social History   Social History  . Marital status: Married    Spouse name: N/A  . Number of children: N/A  . Years of education: N/A   Social History Main Topics  . Smoking status: Former Smoker    Quit date: 05/18/1999  . Smokeless tobacco: None  . Alcohol use Yes     Comment: occ  . Drug use: No  . Sexual activity: Not Asked   Other Topics Concern  . None   Social History Narrative  . None   History reviewed. No pertinent family history. - negative except otherwise stated in the family history section Allergies  Allergen Reactions  . Bee Venom Anaphylaxis  . Other Rash and Other (See Comments)    METALS. Patient has metal sensitivity, including metal sutures.  Causes severe irritation. Metal sensitivity - redness and irritation at site when staples used in incision closure  Prior to Admission medications   Medication Sig Start Date End Date Taking? Authorizing Provider  aspirin EC 81 MG tablet Take 81 mg by mouth every evening.   Yes Historical Provider, MD  cetirizine (ZYRTEC) 10 MG tablet Take 10 mg by mouth every evening.  03/06/16  Yes Historical Provider, MD  Cholecalciferol 4000 units CAPS Take 1 capsule by mouth every evening.   Yes Historical Provider, MD  cyclobenzaprine (FLEXERIL) 10 MG tablet Take 10 mg by mouth 3 (three) times daily as needed. 03/24/16  Yes Historical Provider, MD  EPINEPHrine 0.3 mg/0.3 mL IJ SOAJ injection Inject 0.3 mg  into the muscle once as needed. 01/18/16  Yes Historical Provider, MD  etodolac (LODINE XL) 400 MG 24 hr tablet Take 800 mg by mouth every evening. 11/29/15  Yes Historical Provider, MD  metFORMIN (GLUCOPHAGE-XR) 500 MG 24 hr tablet Take 1,000 mg by mouth every evening. 03/24/16  Yes Historical Provider, MD  Multiple Vitamin (MULTIVITAMIN WITH MINERALS) TABS Take 1 tablet by mouth daily.   Yes Historical Provider, MD  Olmesartan-Amlodipine-HCTZ (TRIBENZOR) 40-10-25 MG TABS Take 1 tablet by mouth every evening.   Yes Historical Provider, MD  omeprazole (PRILOSEC) 20 MG capsule Take 20 mg by mouth daily.   Yes Historical Provider, MD  rosuvastatin (CRESTOR) 40 MG tablet Take 20 mg by mouth every evening.   Yes Historical Provider, MD  sitaGLIPtin (JANUVIA) 100 MG tablet Take 100 mg by mouth every evening. 03/24/16  Yes Historical Provider, MD   Ct Abdomen Pelvis W Contrast  Result Date: 05/24/2016 CLINICAL DATA:  Fall. EXAM: CT ABDOMEN AND PELVIS WITH CONTRAST TECHNIQUE: Multidetector CT imaging of the abdomen and pelvis was performed using the standard protocol following bolus administration of intravenous contrast. CONTRAST:  100 cc of Isovue-300 COMPARISON:  None. FINDINGS: Lower chest:  No acute findings. Hepatobiliary: Diffuse hepatic steatosis. No focal liver abnormality. The gallbladder is normal. No biliary dilatation. Pancreas: No mass, inflammatory changes, or other significant abnormality. Spleen: Within normal limits in size and appearance. Adrenals/Urinary Tract: Normal appearance of the right adrenal gland. 1.4 cm left adrenal gland nodule is identified. This is unchanged from previous exam and likely represents a benign adenoma. Normal appearance of both kidneys. The urinary bladder appears normal. Stomach/Bowel: The stomach is within normal limits. The small bowel loops have a normal course and caliber. No obstruction. Normal appearance of the colon. Vascular/Lymphatic: Aortic atherosclerosis  noted. No enlarged retroperitoneal or mesenteric adenopathy. No enlarged pelvic or inguinal lymph nodes. Reproductive: No mass or other significant abnormality. Other: There is a large hematoma identified within the ventral aspect of the pelvis. This measures 9.7 x 6.5 x 6.6 cm. There is evidence of active extravasation into the hematoma, image 90 of series 2. Musculoskeletal: Diane stasis of the pubic symphysis is noted. Fracture involving the right sacral wing is identified, image 72 of series 2. The inferior pubic rami are not imaged. There is a new mild superior endplate fracture deformity identified involving the L3 vertebra. IMPRESSION: 1. Pelvic hematoma. There is evidence of active extravasation into the hematoma. 2. Pubic symphysis diastases 3. Right sacral wing fracture. 4. Superior endplate fracture deformity involves the L3 vertebra 5. These results were called by telephone at the time of interpretation on 05/24/2016 at 2:28 pm to Dr. Doug SouSAM JACUBOWITZ , who verbally acknowledged these results. Electronically Signed   By: Signa Kellaylor  Stroud M.D.   On: 05/24/2016 14:28   Ir Angiogram Pelvis Selective Or Supraselective  Result Date: 05/25/2016 INDICATION: Larey SeatFell.  Pelvic fracture. Active extravasation noted on CT. External pelvic fixation. Trauma surgery requests pelvic embolization. EXAM: PELVIC SELECTIVE ARTERIOGRAPHY; IR EMBO ART VEN HEMORR LYMPH EXTRAV INC GUIDE ROADMAPPING; IR ULTRASOUND GUIDANCE VASC ACCESS RIGHT MEDICATIONS: No preprocedural antibiotics indicated. ANESTHESIA/SEDATION: Intravenous Fentanyl and Versed were administered as conscious sedation during continuous monitoring of the patient's level of consciousness and physiological / cardiorespiratory status by the radiology RN, with a total moderate sedation time of 54 minutes. CONTRAST:  17mL ISOVUE-300 IOPAMIDOL (ISOVUE-300) INJECTION 61% PROCEDURE: Informed consent was obtained from the patient following explanation of the procedure, risks,  benefits and alternatives. The patient understands, agrees and consents for the procedure. All questions were addressed. A time out was performed prior to the initiation of the procedure. Maximal barrier sterile technique utilized including caps, mask, sterile gowns, sterile gloves, large sterile drape, hand hygiene, and chlorhexidine prep. Patency of right common femoral artery confirmed with ultrasound and documented. Skin was infiltrated with 1% lidocaine. Under real-time ultrasound guidance, the vessel was accessed with a 21-gauge micropuncture needle, exchanged over a 018 guidewire for a transitional dilator, through which a 035 guidewire was advanced. 5 [place] vascular sheath placed. A 5 French C2 catheter advanced and used to catheterize the left common iliac artery for pelvic arteriography. The catheter was advanced into the internal iliac artery for additional second and third order arteriography attempts to localize the bleeding source. Ultimately, the extravasation could not be localized to a segmental branch, so the catheter with withdrawn into the distal main internal iliac artery for empiric embolization using resorbable Gel-Foam pledgets. The embolization performed to near stasis of flow. The catheter was flushed. catheter was then withdrawn into the common iliac artery for final left pelvic arteriography, demonstrating good antegrade continued antegrade flow through the external iliac artery, near stasis in proximal anterior and posterior divisions with non opacification of distal branches. The C2 catheter was withdrawn. After final right localizing femoral arteriogram, the sheath was removed and hemostasis achieved with the aid of the Exoseal device. The patient tolerated the procedure well. FLUOROSCOPY TIME:  9 minutes 42 seconds [id number] mGy COMPLICATIONS: None immediate IMPRESSION: 1. Technically successful left internal iliac artery embolization using a resorbable agent. Electronically Signed   By:  Lucrezia Europe M.D.   On: 05/25/2016 08:39   Dg Pelvis Portable  Result Date: 05/24/2016 CLINICAL DATA:  Fall 9 feet onto back. EXAM: PORTABLE PELVIS 1-2 VIEWS COMPARISON:  PET CT 04/27/2012 FINDINGS: There is diastasis of the pubic symphysis. This is new since prior PET CT. SI joints appear symmetric. No visible fracture. Early degenerative changes in the hip joints bilaterally. 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 Electronically Signed   By: Rolm Baptise M.D.   On: 05/24/2016 13:11   Ir US Guide Vasc Access Right  Result Date: 05/25/2016 INDICATION: [place]. Pelvic fracture. Active extravasation noted on CT. External pelvic fixation. Trauma surgery requests pelvic embolization. EXAM: PELVIC SELECTIVE ARTERIOGRAPHY; IR EMBO ART VEN HEMORR LYMPH EXTRAV INC GUIDE ROADMAPPING; IR ULTRASOUND GUIDANCE VASC ACCESS RIGHT MEDICATIONS: No preprocedural antibiotics indicated. ANESTHESIA/SEDATION: Intravenous Fentanyl and Versed were administered as conscious sedation during continuous monitoring of the patient's level of consciousness and physiological / cardiorespiratory status by the radiology RN, with a total moderate sedation time of 54 minutes. CONTRAST:  179mL ISOVUE-300 IOPAMIDOL (ISOVUE-300) INJECTION 61% PROCEDURE: Informed consent was obtained from the patient following explanation of the procedure, risks, benefits and alternatives. The patient understands, agrees and consents for the procedure. All questions were addressed.  A time out was performed prior to the initiation of the procedure. Maximal barrier sterile technique utilized including caps, mask, sterile gowns, sterile gloves, large sterile drape, hand hygiene, and chlorhexidine prep. Patency of right common femoral artery confirmed with ultrasound and documented. Skin was infiltrated with 1% lidocaine. Under real-time ultrasound guidance, the vessel was accessed with a 21-gauge micropuncture needle, exchanged over a 018 guidewire  for a transitional dilator, through which a 035 guidewire was advanced. 5 [place] vascular sheath placed. A 5 French C2 catheter advanced and used to catheterize the left common iliac artery for pelvic arteriography. The catheter was advanced into the internal iliac artery for additional second and third order arteriography attempts to localize the bleeding source. Ultimately, the extravasation could not be localized to a segmental branch, so the catheter with withdrawn into the distal main internal iliac artery for empiric embolization using resorbable Gel-Foam pledgets. The embolization performed to near stasis of flow. The catheter was flushed. catheter was then withdrawn into the common iliac artery for final left pelvic arteriography, demonstrating good antegrade continued antegrade flow through the external iliac artery, near stasis in proximal anterior and posterior divisions with non opacification of distal branches. The C2 catheter was withdrawn. After final right localizing femoral arteriogram, the sheath was removed and hemostasis achieved with the aid of the Exoseal device. The patient tolerated the procedure well. FLUOROSCOPY TIME:  9 minutes 42 seconds [id number] mGy COMPLICATIONS: None immediate IMPRESSION: 1. Technically successful left internal iliac artery embolization using a resorbable agent. Electronically Signed   By: Lucrezia Europe M.D.   On: 05/25/2016 08:39   Dg Chest Portable 1 View  Result Date: 05/24/2016 CLINICAL DATA:  Fall 9 feet onto back. Bilateral hip pain. Pelvic fracture. EXAM: PORTABLE CHEST 1 VIEW COMPARISON:  07/30/2004 FINDINGS: Heart and mediastinal contours are within normal limits. No focal opacities or effusions. No acute bony abnormality. IMPRESSION: No active disease. Electronically Signed   By: Rolm Baptise M.D.   On: 05/24/2016 13:12   Ir Embo Art  [place] Guide Roadmapping  Result Date: 05/25/2016 INDICATION: [place]. Pelvic fracture. Active  extravasation noted on CT. External pelvic fixation. Trauma surgery requests pelvic embolization. EXAM: PELVIC SELECTIVE ARTERIOGRAPHY; IR EMBO ART VEN HEMORR LYMPH EXTRAV INC GUIDE ROADMAPPING; IR ULTRASOUND GUIDANCE VASC ACCESS RIGHT MEDICATIONS: No preprocedural antibiotics indicated. ANESTHESIA/SEDATION: Intravenous Fentanyl and Versed were administered as conscious sedation during continuous monitoring of the patient's level of consciousness and physiological / cardiorespiratory status by the radiology RN, with a total moderate sedation time of 54 minutes. CONTRAST:  122mL ISOVUE-300 IOPAMIDOL (ISOVUE-300) INJECTION 61% PROCEDURE: Informed consent was obtained from the patient following explanation of the procedure, risks, benefits and alternatives. The patient understands, agrees and consents for the procedure. All questions were addressed. A time out was performed prior to the initiation of the procedure. Maximal barrier sterile technique utilized including caps, mask, sterile gowns, sterile gloves, large sterile drape, hand hygiene, and chlorhexidine prep. Patency of right common femoral artery confirmed with ultrasound and documented. Skin was infiltrated with 1% lidocaine. Under real-time ultrasound guidance, the vessel was accessed with a 21-gauge micropuncture needle, exchanged over a 018 guidewire for a transitional dilator, through which a 035 guidewire was advanced. 5 [place] vascular sheath placed. A 5 French C2 catheter advanced and used to catheterize the left common iliac artery for pelvic arteriography. The catheter was advanced into the internal iliac artery for additional second and third order arteriography attempts to localize the  bleeding source. Ultimately, the extravasation could not be localized to a segmental branch, so the catheter with withdrawn into the distal main internal iliac artery for empiric embolization using resorbable Gel-Foam pledgets. The embolization performed to near  stasis of flow. The catheter was flushed. catheter was then withdrawn into the common iliac artery for final left pelvic arteriography, demonstrating good antegrade continued antegrade flow through the external iliac artery, near stasis in proximal anterior and posterior divisions with non opacification of distal branches. The C2 catheter was withdrawn. After final right localizing femoral arteriogram, the sheath was removed and hemostasis achieved with the aid of the Exoseal device. The patient tolerated the procedure well. FLUOROSCOPY TIME:  9 minutes 42 seconds [id number] mGy COMPLICATIONS: None immediate IMPRESSION: 1. Technically successful left internal iliac artery embolization using a resorbable agent. Electronically Signed   By: Lucrezia Europe M.D.   On: 05/25/2016 08:39   - pertinent xrays, CT, MRI studies were reviewed and independently interpreted  Positive ROS: All other systems have been reviewed and were otherwise negative with the exception of those mentioned in the HPI and as above.  Physical Exam: General: Alert, no acute distress Cardiovascular: No pedal edema Respiratory: No cyanosis, no use of accessory musculature GI: No organomegaly, abdomen is soft and non-tender Skin: No lesions in the area of chief complaint Neurologic: Sensation intact distally Psychiatric: Patient is competent for consent with normal mood and affect Lymphatic: No axillary or cervical lymphadenopathy  MUSCULOSKELETAL:  - pelvis painful to any movement or palpation - BLE NVI - abd soft  Assessment: APC pelvic ring injury with widening of pubic symphysis  Plan: - may leave binder off as patient is HDS - will likely require ORIF - will contact Dr. Marcelino Scot for definitive management - NWB BLE - lovenox - NPO after midnight  Thank you for the consult and the opportunity to see Corey Sellers. Eduard Roux, MD [place] 9:05 AM

## 2016-05-25 NOTE — Consult Note (Signed)
Subjective: Patient is a 65 y.o. male who complains of back pain after a fall off a 10 foot scaffold while helping to build a [employer] house earlier today. He suffered some type of pelvic injury and has pelvic bleeding and therefore is in the neurosurgical ICU.  Onset of symptoms was several hours ago, unchanged since that time.  Onset was related to a fall. The pain is rated moderate, and is located at the across the lower back. The pain is described as aching and occurs all day. The symptoms denies been progressive. Symptoms are exacerbated by nothing in particular. The patient has tried pain medications. CT showed minor superior endplate fracture of L3 without significant loss of height. He denies numbness tingling or weakness in the legs.  Past Medical History:  Diagnosis Date  . Diabetes mellitus   . GERD (gastroesophageal reflux disease)   . Hypertension   . 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 Wears glasses     Past Surgical History:  Procedure Laterality Date  . APPENDECTOMY    . BASAL CELL CARCINOMA EXCISION     left chin  . EXCISION METACARPAL MASS Right 05/23/2013   Procedure: RIGHT LONG EXCISION ANNULAR LIGAMENT CYST;  Surgeon: Tennis Must, MD;  Location: [facility];  Service: Orthopedics;  Laterality: Right;  . GANGLION CYST EXCISION Left 06/06/2013   Procedure: LEFT WRIST EXCISION MASS;  Surgeon: Tennis Must, MD;  Location: [place];  Service: Orthopedics;  Laterality: Left;  . JOINT REPLACEMENT  2003,2004   Rt shoulder  . PAROTIDECTOMY W/ NECK DISSECTION TOTAL  2013   left parotidectomy-26 nodes removed  . TONSILLECTOMY    . TRIGGER FINGER RELEASE Right 05/23/2013   Procedure: RELEASE TRIGGER FINGER/A-1 PULLEY RIGHT INDEX AND LONG;  Surgeon: Tennis Must, MD;  Location: [facility];  Service: Orthopedics;  Laterality: Right;  . TRIGGER FINGER RELEASE Left 06/06/2013    Procedure: RELEASE TRIGGER FINGER/A-1 PULLEY LEFT SMALL;  Surgeon: Tennis Must, MD;  Location: [facility];  Service: Orthopedics;  Laterality: Left;  Marland Kitchen VARICOCELE EXCISION     left    Allergies  Allergen Reactions  . Bee Venom Anaphylaxis  . Other Rash and Other (See Comments)    METALS. Patient has metal sensitivity, including metal sutures.  Causes severe irritation. Metal sensitivity - redness and irritation at site when staples used in incision closure    Social History  Substance Use Topics  . Smoking status: Former Smoker    Quit date: 05/18/1999  . Smokeless tobacco: Not on file  . Alcohol use Yes     Comment: occ    History reviewed. No pertinent family history. Prior to Admission medications   Medication Sig Start Date End Date Taking? Authorizing Provider  aspirin EC 81 MG tablet Take 81 mg by mouth every evening.   Yes Historical Provider, MD  cetirizine (ZYRTEC) 10 MG tablet Take 10 mg by mouth every evening.  03/06/16  Yes Historical Provider, MD  Cholecalciferol 4000 units CAPS Take 1 capsule by mouth every evening.   Yes Historical Provider, MD  cyclobenzaprine (FLEXERIL) 10 MG tablet Take 10 mg by mouth 3 (three) times daily as needed. 03/24/16  Yes Historical Provider, MD  EPINEPHrine 0.3 mg/0.3 mL IJ SOAJ injection Inject 0.3 mg into the muscle once as needed. 01/18/16  Yes Historical Provider, MD  etodolac (LODINE XL)  400 MG 24 hr tablet Take 800 mg by mouth every evening. 11/29/15  Yes Historical Provider, MD  metFORMIN (GLUCOPHAGE-XR) 500 MG 24 hr tablet Take 1,000 mg by mouth every evening. 03/24/16  Yes Historical Provider, MD  Multiple Vitamin (MULTIVITAMIN WITH MINERALS) TABS Take 1 tablet by mouth daily.   Yes Historical Provider, MD  Olmesartan-Amlodipine-HCTZ (TRIBENZOR) 40-10-25 MG TABS Take 1 tablet by mouth every evening.   Yes Historical Provider, MD  omeprazole (PRILOSEC) 20 MG capsule Take 20 mg by mouth daily.   Yes Historical Provider, MD   rosuvastatin (CRESTOR) 40 MG tablet Take 20 mg by mouth every evening.   Yes Historical Provider, MD  sitaGLIPtin (JANUVIA) 100 MG tablet Take 100 mg by mouth every evening. 03/24/16  Yes Historical Provider, MD     Review of Systems  Positive ROS: neg  All other systems have been reviewed and were otherwise negative with the exception of those mentioned in the HPI and as above.  Objective: Vital signs in last 24 hours: Temp:  [98.1 F (36.7 C)-98.8 F (37.1 C)] 98.5 F (36.9 C) (08/27 0000) Pulse Rate:  [62-111] 94 (08/26 2300) Resp:  [12-26] 16 (08/26 2200) BP: (80-152)/(44-81) 112/52 (08/26 2300) SpO2:  [90 %-100 %] 93 % (08/26 2300) Weight:  [112 kg (247 lb)] 112 kg (247 lb) (08/26 1550)  General Appearance: Alert, cooperative, no distress, appears stated age, lying flat in bed Head: Normocephalic, without obvious abnormality, atraumatic Eyes: PERRL, conjunctiva/corneas clear, EOM's intact    Neck: Supple, symmetrical, trachea midline Lungs: respirations unlabored Heart: Regular rate and rhythm Abdomen: Soft Extremities: Extremities normal, atraumatic, no cyanosis or edema Skin: Skin color, texture, turgor normal, no rashes or lesions  NEUROLOGIC:   Mental status: alert and oriented, no aphasia, good attention span, Fund of knowledge/ memory ok Motor Exam - grossly normal to in bed exam and somewhat limited by his pain Sensory Exam - grossly normal Reflexes: symm Coordination - grossly normal Gait - not tested Balance - not tested Cranial Nerves: I: smell Not tested  II: visual acuity  OS: na    OD: na  II: visual fields Full to confrontation  II: pupils Equal, round, reactive to light  III,VII: ptosis None  III,IV,VI: extraocular muscles  Full ROM  V: mastication Normal  V: facial light touch sensation  Normal  V,VII: corneal reflex  Present  VII: facial muscle function - upper  Normal  VII: facial muscle function - lower Normal  VIII: hearing Not tested   IX: soft palate elevation  Normal  IX,X: gag reflex Present  XI: trapezius strength  5/5  XI: sternocleidomastoid strength 5/5  XI: neck flexion strength  5/5  XII: tongue strength  Normal    Data Review Lab Results  Component Value Date   WBC 11.0 (H) 05/24/2016   HGB 13.9 05/24/2016   HCT 41.0 05/24/2016   MCV 88.2 05/24/2016   PLT 189 05/24/2016   Lab Results  Component Value Date   NA 142 05/24/2016   K 3.9 05/24/2016   CL 105 05/24/2016   CO2 22 05/24/2016   BUN 25 (H) 05/24/2016   CREATININE 0.90 05/24/2016   GLUCOSE 159 (H) 05/24/2016   Lab Results  Component Value Date   INR 1.13 05/24/2016    Assessment/Plan: CT scan reviewed. 65 year old gentleman with a minor superior endplate fracture of L3 that should be stable. It is okay to raise his head of bed from my standpoint. When he can be mobilized from  a trauma standpoint, he can be mobilized in an LSO brace which he can don sitting. He should follow-up with me 2 weeks after discharge from the hospital for repeat x-rays. If his x-rays remain stable I will probably brace him for 6-8 weeks.   JONES,DAVID S 05/25/2016 1:26 AM

## 2016-05-26 LAB — CBC
HEMATOCRIT: 32.9 % — AB (ref 39.0–52.0)
Hemoglobin: 10.6 g/dL — ABNORMAL LOW (ref 13.0–17.0)
MCH: 29.2 pg (ref 26.0–34.0)
MCHC: 32.2 g/dL (ref 30.0–36.0)
MCV: 90.6 fL (ref 78.0–100.0)
Platelets: 147 10*3/uL — ABNORMAL LOW (ref 150–400)
RBC: 3.63 MIL/uL — AB (ref 4.22–5.81)
RDW: 13.9 % (ref 11.5–15.5)
WBC: 10 10*3/uL (ref 4.0–10.5)

## 2016-05-26 LAB — BASIC METABOLIC PANEL
ANION GAP: 8 (ref 5–15)
BUN: 9 mg/dL (ref 6–20)
CHLORIDE: 101 mmol/L (ref 101–111)
CO2: 26 mmol/L (ref 22–32)
Calcium: 8.4 mg/dL — ABNORMAL LOW (ref 8.9–10.3)
Creatinine, Ser: 0.73 mg/dL (ref 0.61–1.24)
GFR calc Af Amer: >60 mL/min (ref >60)
GFR calc non Af Amer: >60 mL/min (ref >60)
GLUCOSE: 189 mg/dL — AB (ref 65–99)
POTASSIUM: 4 mmol/L (ref 3.5–5.1)
Sodium: 135 mmol/L (ref 135–145)

## 2016-05-26 LAB — GLUCOSE, CAPILLARY
GLUCOSE-CAPILLARY: 165 mg/dL — AB (ref 65–99)
GLUCOSE-CAPILLARY: 179 mg/dL — AB (ref 65–99)
Glucose-Capillary: 198 mg/dL — ABNORMAL HIGH (ref 65–99)
Glucose-Capillary: 161 mg/dL — ABNORMAL HIGH (ref 65–99)
Glucose-Capillary: 166 mg/dL — ABNORMAL HIGH (ref 65–99)

## 2016-05-26 LAB — LACTIC ACID, PLASMA: Lactic Acid, Venous: 0.9 mmol/L (ref 0.5–1.9)

## 2016-05-26 LAB — HEMOGLOBIN A1C
HEMOGLOBIN A1C: 7 % — AB (ref 4.8–5.6)
MEAN PLASMA GLUCOSE: 154 mg/dL

## 2016-05-26 MED ORDER — CEFAZOLIN SODIUM-DEXTROSE 2-4 GM/100ML-% IV SOLN
2.0000 g | Freq: Once | INTRAVENOUS | Status: AC
  Administered 2016-05-27: 2 g via INTRAVENOUS
  Filled 2016-05-26: qty 100

## 2016-05-26 MED ORDER — ENOXAPARIN SODIUM 40 MG/0.4ML ~~LOC~~ SOLN
40.0000 mg | SUBCUTANEOUS | Status: DC
  Administered 2016-05-26: 40 mg via SUBCUTANEOUS
  Filled 2016-05-26: qty 0.4

## 2016-05-26 NOTE — Consult Note (Signed)
Orthopaedic Trauma Service (OTS) Consult   Reason for Consult:  pelvic ring fracture Referring Physician: Ephriam Jenkins, MD (ortho)   HPI: NORMAND DAMRON is an 65 y.o. white male who sustained a fall off of some scaffolding on 05/24/2016. Patient fell about 8 feet. He was working with [employer] when he sustained his injury. Patient landed on some type of machine below him. Landed back first. Had immediate onset of pain and inability to bear weight. Patient was brought to [facility] as a trauma activation. The patient did become hypotensive during his workup. Blood pressure 84/50. 1 L of IV normal saline was given. Upgraded to a level I TRAUMA ALERT. On-call trauma surgeon placed a pelvic binder. The patient did have a large pelvic hematoma. Patient was taken to the interventional radiology suite for embolization. Left internal iliac artery G[place]oam embolization was performed by Dr. Vernard Gambles. Patient tolerated the procedure well. Pelvic binder was placed at about 1315 on 05/24/2016. After embolization orthopedics was in consult did. Pelvic binder was removed as it has been on for more than 12 hours and the patient's hemodynamic status was stable.  Due to the complexity injury orthopedic trauma service consulted for definitive management. The patient only complains of low back pain as well as some anterior pelvic pain. 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 He has had some left SI injections in the past. But he does report acute low back pain related to his injury. Denies any numbness or tingling in his lower extremities. Denies any additional injuries elsewhere. Patient has been essentially on bedrest since admission. He has been on clears did try to advance his diet this morning but was not tolerating it all that well.   The patient does have a history of diabetes he is on Januvia and metformin at home he has been on sliding scale while he has been admitted to the hospital  Patient does not smoke. He  quit in 2000. He does drink daily couple beers or a couple glasses of wine   Patient works as an Programmer, systems for [employer]  Past Medical History:  Diagnosis Date  . Diabetes mellitus   . GERD (gastroesophageal reflux disease)   . Hypertension   . 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 Wears glasses     Past Surgical History:  Procedure Laterality Date  . APPENDECTOMY    . BASAL CELL CARCINOMA EXCISION     left chin  . EXCISION METACARPAL MASS Right 05/23/2013   Procedure: RIGHT LONG EXCISION ANNULAR LIGAMENT CYST;  Surgeon: Tennis Must, MD;  Location: [facility];  Service: Orthopedics;  Laterality: Right;  . GANGLION CYST EXCISION Left 06/06/2013   Procedure: LEFT WRIST EXCISION MASS;  Surgeon: Tennis Must, MD;  Location: [facility];  Service: Orthopedics;  Laterality: Left;  . IR GENERIC HISTORICAL  05/24/2016   IR ANGIOGRAM PELVIS SELECTIVE OR SUPRASELECTIVE 05/24/2016 Arne Cleveland, MD [facility]  . IR GENERIC HISTORICAL  05/24/2016   IR US GUIDE VASC ACCESS RIGHT 05/24/2016 Arne Cleveland, MD [facility]  . IR GENERIC HISTORICAL  05/24/2016   IR EMBO ART  VEN HEMORR LYMPH EXTRAV  INC GUIDE ROADMAPPING 05/24/2016 Arne Cleveland, MD [facility]  . JOINT REPLACEMENT  2003,2004   Rt shoulder  . PAROTIDECTOMY W/ NECK DISSECTION TOTAL  2013   left parotidectomy-26 nodes removed  . TONSILLECTOMY    .  TRIGGER FINGER RELEASE Right 05/23/2013   Procedure: RELEASE TRIGGER FINGER/A-1 PULLEY RIGHT INDEX AND LONG;  Surgeon: Tennis Must, MD;  Location: [facility];  Service: Orthopedics;  Laterality: Right;  . TRIGGER FINGER RELEASE Left 06/06/2013   Procedure: RELEASE TRIGGER FINGER/A-1 PULLEY LEFT SMALL;  Surgeon: Tennis Must, MD;  Location: [facility];  Service: Orthopedics;  Laterality: Left;  Marland Kitchen VARICOCELE EXCISION     left    History reviewed. No  pertinent family history.  Social History:  reports that he quit smoking about 17 years ago. He does not have any smokeless tobacco history on file. He reports that he drinks alcohol. He reports that he does not use drugs.  Allergies:  Allergies  Allergen Reactions  . Bee Venom Anaphylaxis  . Other Rash and Other (See Comments)    METALS. Patient has metal sensitivity, including metal sutures.  Causes severe irritation. Metal sensitivity - redness and irritation at site when staples used in incision closure    Medications:  I have reviewed the patient's current medications. Prior to Admission:  Prescriptions Prior to Admission  Medication Sig Dispense Refill Last Dose  . aspirin EC 81 MG tablet Take 81 mg by mouth every evening.   05/23/2016 at Unknown time  . cetirizine (ZYRTEC) 10 MG tablet Take 10 mg by mouth every evening.    05/23/2016 at Unknown time  . Cholecalciferol 4000 units CAPS Take 1 capsule by mouth every evening.   05/23/2016 at Unknown time  . cyclobenzaprine (FLEXERIL) 10 MG tablet Take 10 mg by mouth 3 (three) times daily as needed.   unkn  . EPINEPHrine 0.3 mg/0.3 mL IJ SOAJ injection Inject 0.3 mg into the muscle once as needed.   Never used  . etodolac (LODINE XL) 400 MG 24 hr tablet Take 800 mg by mouth every evening.   05/23/2016 at Unknown time  . metFORMIN (GLUCOPHAGE-XR) 500 MG 24 hr tablet Take 1,000 mg by mouth every evening.   05/23/2016 at Unknown time  . Multiple Vitamin (MULTIVITAMIN WITH MINERALS) TABS Take 1 tablet by mouth daily.   05/23/2016 at Unknown time  . Olmesartan-Amlodipine-HCTZ (TRIBENZOR) 40-10-25 MG TABS Take 1 tablet by mouth every evening.   05/23/2016 at Unknown time  . omeprazole (PRILOSEC) 20 MG capsule Take 20 mg by mouth daily.   05/23/2016 at Unknown time  . rosuvastatin (CRESTOR) 40 MG tablet Take 20 mg by mouth every evening.   05/23/2016 at Unknown time  . sitaGLIPtin (JANUVIA) 100 MG tablet Take 100 mg by mouth every evening.   05/23/2016  at Unknown time    Results for orders placed or performed during the hospital encounter of 05/24/16 (from the past 48 hour(s))  Urinalysis, Routine w reflex microscopic (not at [facility])     Status: Abnormal   Collection Time: 05/24/16  2:22 PM  Result Value Ref Range   Color, Urine YELLOW YELLOW   APPearance CLEAR CLEAR   Specific Gravity, Urine >1.046 (H) 1.005 - 1.030   pH 6.5 5.0 - 8.0   Glucose, UA NEGATIVE NEGATIVE mg/dL   Hgb urine dipstick NEGATIVE NEGATIVE   Bilirubin Urine LARGE (A) NEGATIVE   Ketones, ur 15 (A) NEGATIVE mg/dL   Protein, ur NEGATIVE NEGATIVE mg/dL   Nitrite NEGATIVE NEGATIVE   Leukocytes, UA NEGATIVE NEGATIVE    Comment: MICROSCOPIC NOT DONE ON URINES WITH NEGATIVE PROTEIN, BLOOD, LEUKOCYTES, NITRITE, OR GLUCOSE <1000 mg/dL.  MRSA PCR Screening     Status: None  Collection Time: 05/24/16  6:19 PM  Result Value Ref Range   MRSA by PCR NEGATIVE NEGATIVE    Comment:        The GeneXpert MRSA Assay (FDA approved for NASAL specimens only), is one component of a comprehensive MRSA colonization surveillance program. It is not intended to diagnose MRSA infection nor to guide or monitor treatment for MRSA infections.   Glucose, capillary     Status: Abnormal   Collection Time: 05/24/16  8:15 PM  Result Value Ref Range   Glucose-Capillary 196 (H) 65 - 99 mg/dL  Glucose, capillary     Status: Abnormal   Collection Time: 05/25/16  2:13 AM  Result Value Ref Range   Glucose-Capillary 175 (H) 65 - 99 mg/dL  CBC     Status: Abnormal   Collection Time: 05/25/16  3:38 AM  Result Value Ref Range   WBC 9.0 4.0 - 10.5 K/uL   RBC 3.88 (L) 4.22 - 5.81 MIL/uL   Hemoglobin 11.2 (L) 13.0 - 17.0 g/dL   HCT 34.9 (L) 39.0 - 52.0 %   MCV 89.9 78.0 - 100.0 fL   MCH 28.9 26.0 - 34.0 pg   MCHC 32.1 30.0 - 36.0 g/dL   RDW 14.0 11.5 - 15.5 %   Platelets 144 (L) 150 - 400 K/uL  Basic metabolic panel     Status: Abnormal   Collection Time: 05/25/16  3:38 AM  Result Value  Ref Range   Sodium 140 135 - 145 mmol/L   Potassium 3.6 3.5 - 5.1 mmol/L   Chloride 109 101 - 111 mmol/L   CO2 25 22 - 32 mmol/L   Glucose, Bld 171 (H) 65 - 99 mg/dL   BUN 18 6 - 20 mg/dL   Creatinine, Ser 0.90 0.61 - 1.24 mg/dL   Calcium 8.4 (L) 8.9 - 10.3 mg/dL   GFR calc non Af Amer >60 >60 mL/min   GFR calc Af Amer >60 >60 mL/min    Comment: (NOTE) The eGFR has been calculated using the CKD EPI equation. This calculation has not been validated in all clinical situations. eGFR's persistently <60 mL/min signify possible Chronic Kidney Disease.    Anion gap 6 5 - 15  Glucose, capillary     Status: Abnormal   Collection Time: 05/25/16  9:06 AM  Result Value Ref Range   Glucose-Capillary 208 (H) 65 - 99 mg/dL  CBC     Status: Abnormal   Collection Time: 05/25/16  2:24 PM  Result Value Ref Range   WBC 10.2 4.0 - 10.5 K/uL   RBC 3.72 (L) 4.22 - 5.81 MIL/uL   Hemoglobin 10.8 (L) 13.0 - 17.0 g/dL   HCT 33.5 (L) 39.0 - 52.0 %   MCV 90.1 78.0 - 100.0 fL   MCH 29.0 26.0 - 34.0 pg   MCHC 32.2 30.0 - 36.0 g/dL   RDW 14.0 11.5 - 15.5 %   Platelets 156 150 - 400 K/uL  Hemoglobin A1c     Status: Abnormal   Collection Time: 05/25/16  2:52 PM  Result Value Ref Range   Hgb A1c MFr Bld 7.0 (H) 4.8 - 5.6 %    Comment: (NOTE)         Pre-diabetes: 5.7 - 6.4         Diabetes: >6.4         Glycemic control for adults with diabetes: <7.0    Mean Plasma Glucose 154 mg/dL    Comment: (NOTE) Performed At: [facility]  [place], [place] [id number] Lindon Romp MD NK:[id number]   Glucose, capillary     Status: Abnormal   Collection Time: 05/25/16  4:53 PM  Result Value Ref Range   Glucose-Capillary 210 (H) 65 - 99 mg/dL  Glucose, capillary     Status: Abnormal   Collection Time: 05/25/16 10:09 PM  Result Value Ref Range   Glucose-Capillary 208 (H) 65 - 99 mg/dL  CBC     Status: Abnormal   Collection Time: 05/26/16  2:27 AM  Result Value Ref Range   WBC 10.0  4.0 - 10.5 K/uL   RBC 3.63 (L) 4.22 - 5.81 MIL/uL   Hemoglobin 10.6 (L) 13.0 - 17.0 g/dL   HCT 32.9 (L) 39.0 - 52.0 %   MCV 90.6 78.0 - 100.0 fL   MCH 29.2 26.0 - 34.0 pg   MCHC 32.2 30.0 - 36.0 g/dL   RDW 13.9 11.5 - 15.5 %   Platelets 147 (L) 150 - 400 K/uL  Basic metabolic panel     Status: Abnormal   Collection Time: 05/26/16  2:27 AM  Result Value Ref Range   Sodium 135 135 - 145 mmol/L   Potassium 4.0 3.5 - 5.1 mmol/L   Chloride 101 101 - 111 mmol/L   CO2 26 22 - 32 mmol/L   Glucose, Bld 189 (H) 65 - 99 mg/dL   BUN 9 6 - 20 mg/dL   Creatinine, Ser 0.73 0.61 - 1.24 mg/dL   Calcium 8.4 (L) 8.9 - 10.3 mg/dL   GFR calc non Af Amer >60 >60 mL/min   GFR calc Af Amer >60 >60 mL/min    Comment: (NOTE) The eGFR has been calculated using the CKD EPI equation. This calculation has not been validated in all clinical situations. eGFR's persistently <60 mL/min signify possible Chronic Kidney Disease.    Anion gap 8 5 - 15  Glucose, capillary     Status: Abnormal   Collection Time: 05/26/16  8:07 AM  Result Value Ref Range   Glucose-Capillary 198 (H) 65 - 99 mg/dL   Comment 1 Notify RN    Comment 2 Document in Chart   Glucose, capillary     Status: Abnormal   Collection Time: 05/26/16 12:02 PM  Result Value Ref Range   Glucose-Capillary 165 (H) 65 - 99 mg/dL    Ct Abdomen Pelvis W Contrast  Result Date: 05/24/2016 CLINICAL DATA:  Fall. EXAM: CT ABDOMEN AND PELVIS WITH CONTRAST TECHNIQUE: Multidetector CT imaging of the abdomen and pelvis was performed using the standard protocol following bolus administration of intravenous contrast. CONTRAST:  100 cc of Isovue-300 COMPARISON:  None. FINDINGS: Lower chest:  No acute findings. Hepatobiliary: Diffuse hepatic steatosis. No focal liver abnormality. The gallbladder is normal. No biliary dilatation. Pancreas: No mass, inflammatory changes, or other significant abnormality. Spleen: Within normal limits in size and appearance.  Adrenals/Urinary Tract: Normal appearance of the right adrenal gland. 1.4 cm left adrenal gland nodule is identified. This is unchanged from previous exam and likely represents a benign adenoma. Normal appearance of both kidneys. The urinary bladder appears normal. Stomach/Bowel: The stomach is within normal limits. The small bowel loops have a normal course and caliber. No obstruction. Normal appearance of the colon. Vascular/Lymphatic: Aortic atherosclerosis noted. No enlarged retroperitoneal or mesenteric adenopathy. No enlarged pelvic or inguinal lymph nodes. Reproductive: No mass or other significant abnormality. Other: There is a large hematoma identified within the ventral aspect of the pelvis. This measures 9.7 x 6.5  x 6.6 cm. There is evidence of active extravasation into the hematoma, image 90 of series 2. Musculoskeletal: Diane stasis of the pubic symphysis is noted. Fracture involving the right sacral wing is identified, image 72 of series 2. The inferior pubic rami are not imaged. There is a new mild superior endplate fracture deformity identified involving the L3 vertebra. IMPRESSION: 1. Pelvic hematoma. There is evidence of active extravasation into the hematoma. 2. Pubic symphysis diastases 3. Right sacral wing fracture. 4. Superior endplate fracture deformity involves the L3 vertebra 5. These results were called by telephone at the time of interpretation on 05/24/2016 at 2:28 pm to Dr. Orlie Dakin , who verbally acknowledged these results. Electronically Signed   By: Kerby Moors M.D.   On: 05/24/2016 14:28   Ir Angiogram Pelvis Selective Or Supraselective  Result Date: 05/25/2016 INDICATION: [place]. Pelvic fracture. Active extravasation noted on CT. External pelvic fixation. Trauma surgery requests pelvic embolization. EXAM: PELVIC SELECTIVE ARTERIOGRAPHY; IR EMBO ART VEN HEMORR LYMPH EXTRAV INC GUIDE ROADMAPPING; IR ULTRASOUND GUIDANCE VASC ACCESS RIGHT MEDICATIONS: No preprocedural  antibiotics indicated. ANESTHESIA/SEDATION: Intravenous Fentanyl and Versed were administered as conscious sedation during continuous monitoring of the patient's level of consciousness and physiological / cardiorespiratory status by the radiology RN, with a total moderate sedation time of 54 minutes. CONTRAST:  157m ISOVUE-300 IOPAMIDOL (ISOVUE-300) INJECTION 61% PROCEDURE: Informed consent was obtained from the patient following explanation of the procedure, risks, benefits and alternatives. The patient understands, agrees and consents for the procedure. All questions were addressed. A time out was performed prior to the initiation of the procedure. Maximal barrier sterile technique utilized including caps, mask, sterile gowns, sterile gloves, large sterile drape, hand hygiene, and chlorhexidine prep. Patency of right common femoral artery confirmed with ultrasound and documented. Skin was infiltrated with 1% lidocaine. Under real-time ultrasound guidance, the vessel was accessed with a 21-gauge micropuncture needle, exchanged over a 018 guidewire for a transitional dilator, through which a 035 guidewire was advanced. 5 [place] sheath placed. A 5 French C2 catheter advanced and used to catheterize the left common iliac artery for pelvic arteriography. The catheter was advanced into the internal iliac artery for additional second and third order arteriography attempts to localize the bleeding source. Ultimately, the extravasation could not be localized to a segmental branch, so the catheter with withdrawn into the distal main internal iliac artery for empiric embolization using resorbable Gel-Foam pledgets. The embolization performed to near stasis of flow. The catheter was flushed. catheter was then withdrawn into the common iliac artery for final left pelvic arteriography, demonstrating good antegrade continued antegrade flow through the external iliac artery, near stasis in proximal anterior and  posterior divisions with non opacification of distal branches. The C2 catheter was withdrawn. After final right localizing femoral arteriogram, the sheath was removed and hemostasis achieved with the aid of the Exoseal device. The patient tolerated the procedure well. FLUOROSCOPY TIME:  9 minutes 42 seconds 34503mGy COMPLICATIONS: None immediate IMPRESSION: 1. Technically successful left internal iliac artery embolization using a resorbable agent. Electronically Signed   By: DLucrezia EuropeM.D.   On: 05/25/2016 08:39   Ir [place]Guide Vasc Access Right  Result Date: 05/25/2016 INDICATION: [place] Pelvic fracture. Active extravasation noted on CT. External pelvic fixation. Trauma surgery requests pelvic embolization. EXAM: PELVIC SELECTIVE ARTERIOGRAPHY; IR EMBO ART VEN HEMORR LYMPH EXTRAV INC GUIDE ROADMAPPING; IR ULTRASOUND GUIDANCE VASC ACCESS RIGHT MEDICATIONS: No preprocedural antibiotics indicated. ANESTHESIA/SEDATION: Intravenous Fentanyl and Versed were administered as conscious  sedation during continuous monitoring of the patient's level of consciousness and physiological / cardiorespiratory status by the radiology RN, with a total moderate sedation time of 54 minutes. CONTRAST:  119m ISOVUE-300 IOPAMIDOL (ISOVUE-300) INJECTION 61% PROCEDURE: Informed consent was obtained from the patient following explanation of the procedure, risks, benefits and alternatives. The patient understands, agrees and consents for the procedure. All questions were addressed. A time out was performed prior to the initiation of the procedure. Maximal barrier sterile technique utilized including caps, mask, sterile gowns, sterile gloves, large sterile drape, hand hygiene, and chlorhexidine prep. Patency of right common femoral artery confirmed with ultrasound and documented. Skin was infiltrated with 1% lidocaine. Under real-time ultrasound guidance, the vessel was accessed with a 21-gauge micropuncture needle, exchanged over a 018  guidewire for a transitional dilator, through which a 035 guidewire was advanced. 5 [place] sheath placed. A 5 French C2 catheter advanced and used to catheterize the left common iliac artery for pelvic arteriography. The catheter was advanced into the internal iliac artery for additional second and third order arteriography attempts to localize the bleeding source. Ultimately, the extravasation could not be localized to a segmental branch, so the catheter with withdrawn into the distal main internal iliac artery for empiric embolization using resorbable Gel-Foam pledgets. The embolization performed to near stasis of flow. The catheter was flushed. catheter was then withdrawn into the common iliac artery for final left pelvic arteriography, demonstrating good antegrade continued antegrade flow through the external iliac artery, near stasis in proximal anterior and posterior divisions with non opacification of distal branches. The C2 catheter was withdrawn. After final right localizing femoral arteriogram, the sheath was removed and hemostasis achieved with the aid of the Exoseal device. The patient tolerated the procedure well. FLUOROSCOPY TIME:  9 minutes 42 seconds 31062mGy COMPLICATIONS: None immediate IMPRESSION: 1. Technically successful left internal iliac artery embolization using a resorbable agent. Electronically Signed   By: DLucrezia EuropeM.D.   On: 05/25/2016 08:39   Ir Embo Art  VLawson FiscalHemorr LFort RecoveryGuide Roadmapping  Result Date: 05/25/2016 INDICATION: [place] Pelvic fracture. Active extravasation noted on CT. External pelvic fixation. Trauma surgery requests pelvic embolization. EXAM: PELVIC SELECTIVE ARTERIOGRAPHY; IR EMBO ART VEN HEMORR LYMPH EXTRAV INC GUIDE ROADMAPPING; IR ULTRASOUND GUIDANCE VASC ACCESS RIGHT MEDICATIONS: No preprocedural antibiotics indicated. ANESTHESIA/SEDATION: Intravenous Fentanyl and Versed were administered as conscious sedation during continuous monitoring  of the patient's level of consciousness and physiological / cardiorespiratory status by the radiology RN, with a total moderate sedation time of 54 minutes. CONTRAST:  1162mISOVUE-300 IOPAMIDOL (ISOVUE-300) INJECTION 61% PROCEDURE: Informed consent was obtained from the patient following explanation of the procedure, risks, benefits and alternatives. The patient understands, agrees and consents for the procedure. All questions were addressed. A time out was performed prior to the initiation of the procedure. Maximal barrier sterile technique utilized including caps, mask, sterile gowns, sterile gloves, large sterile drape, hand hygiene, and chlorhexidine prep. Patency of right common femoral artery confirmed with ultrasound and documented. Skin was infiltrated with 1% lidocaine. Under real-time ultrasound guidance, the vessel was accessed with a 21-gauge micropuncture needle, exchanged over a 018 guidewire for a transitional dilator, through which a 035 guidewire was advanced. 5 Fr[place] sheath placed. A 5 French C2 catheter advanced and used to catheterize the left common iliac artery for pelvic arteriography. The catheter was advanced into the internal iliac artery for additional second and third order arteriography attempts to localize the bleeding source.  Ultimately, the extravasation could not be localized to a segmental branch, so the catheter with withdrawn into the distal main internal iliac artery for empiric embolization using resorbable Gel-Foam pledgets. The embolization performed to near stasis of flow. The catheter was flushed. catheter was then withdrawn into the common iliac artery for final left pelvic arteriography, demonstrating good antegrade continued antegrade flow through the external iliac artery, near stasis in proximal anterior and posterior divisions with non opacification of distal branches. The C2 catheter was withdrawn. After final right localizing femoral arteriogram, the  sheath was removed and hemostasis achieved with the aid of the Exoseal device. The patient tolerated the procedure well. FLUOROSCOPY TIME:  9 minutes 42 seconds 1610 mGy COMPLICATIONS: None immediate IMPRESSION: 1. Technically successful left internal iliac artery embolization using a resorbable agent. Electronically Signed   By: Lucrezia Europe M.D.   On: 05/25/2016 08:39    Review of Systems  Constitutional: Negative for chills and fever.  Eyes: Negative for blurred vision and double vision.       Wears glasses  Respiratory: Negative for shortness of breath and wheezing.   Cardiovascular: Negative for chest pain and palpitations.  Gastrointestinal: Negative for nausea and vomiting.  Musculoskeletal:       Pelvic and low back pain  Neurological: Negative for tingling, sensory change and headaches.   Blood pressure (!) 145/60, pulse (!) 106, temperature 99.4 F (37.4 C), temperature source Oral, resp. rate (!) 22, height 5' 9"  (1.753 m), weight 112 kg (247 lb), SpO2 98 %. Physical Exam  Constitutional: He is oriented to person, place, and time. He is cooperative.  65 y/o white male, appears appropriate for stated age NAD  Cardiovascular: Normal rate, regular rhythm, S1 normal and S2 normal.   Pulmonary/Chest:  Anterior fields are clear   Abdominal:  + distention  Infrequent bowel sounds Nontender   Musculoskeletal:  Pelvis and B LEx  Inspection:   + scrotal edema and ecchymosis    No open wounds or lesions to pelvis     No appreciable ecchymosis to flanks   B LEx are unremarkable, no effusions or open wounds  Bony eval:   Anterior pelvic ring is tender to palpation. Did not stress his pelvis given known injury.   Patient does have tenderness to his right lower back with palpation   Bilateral hips, knees, ankles and feet are unremarkable and nontender Soft tissue:   Bilateral lower extremities are free of acute findings with respect to the soft tissue    No evidence of skin  breakdown from binder     Knees, ankles are stable    No pain with axial loading or logrolling of his hips bilaterally    Again significant ecchymosis and scrotal swelling is noted    He does have some suprapubic swelling as well ROM:    Did not assess lower extremity range of motion Sensation:    DPN, SPN, TN sensory functions intact Motor:    EHL, FHL, anterior tibialis, posterior tibialis, peroneals and gastrocsoleus complex motor functions are intact Vascular:    Extremities are warm bilaterally    + DP pulses bilaterally    Compartments are soft and nontender bilaterally  BUEx shoulder, elbow, wrist, digits- no skin wounds, nontender, no instability, no blocks to motion            Well-healed surgical wounds are noted to the bilateral upper extremities  Sens  Ax/R/M/U intact  Mot   Ax/ R/ PIN/ M/ AIN/ U  intact  Rad 2+   Neurological: He is alert and oriented to person, place, and time.  Skin: Skin is warm and intact. Capillary refill takes less than 2 seconds.  Psychiatric: He has a normal mood and affect. His speech is normal. Thought content normal. Cognition and memory are normal.     Assessment/Plan:  65 year old white male status post fall off of scaffolding with complex pelvic ring injury, status post IR embolization of left internal iliac artery  -APC 2 pelvic ring injury  Patient will need surgical stabilization of his anterior and posterior pelvic ring  Plan for plate osteosynthesis of his pubic symphysis tomorrow morning. We'll also perform right SI screw fixation to address his zone 2 sacral fracture. We will likely place a transsacral screw.  Bedrest for now  Patient will be essentially bed to chair transfers for 8 weeks. We will likely allow him to place weight on his left leg to facilitate transfers. Nonweightbearing on his right lower extremity.   PT and OT evaluations postoperatively   Patient is at risk for a nonunion or deep infection given his diabetes.  We did discuss the importance of maintaining sugars below 200 mg/dL to decrease his risk of developing deep infection and/or nonunion  - Pain management:  Continue per trauma service  - ABL anemia/Hemodynamics  CBC in the morning  Type and screen in the morning  Check lactic acid this afternoon  - Medical issues   Diabetes   Currently on sliding scale  - DVT/PE prophylaxis:  SCDs and Lovenox  Lovenox ridge to Coumadin postoperatively. Patient will require Coumadin therapy for 8 weeks for DVT and PE prophylaxis. His injury for cemented increased risk for the development of blood clots  - ID:   Perioperative Ancef   - Activity:  Bedrest for today  Therapy after surgery  - FEN/GI prophylaxis/Foley/Lines:  Clears for now  Patient's belly did sound quite quiet. We will likely keep him on clear/liquid diet x 48h after surgery   Foley will be placed in the OR   - Dispo:   OR tomorrow morning for ORIF pelvic ring and right SI screw   Jari Pigg, PA-C [facility] [phone] (P) 05/26/2016, 1:56 PM

## 2016-05-26 NOTE — Progress Notes (Signed)
Trauma Service Note  Subjective: +BM overnight, allowed a few bites of breakfast, then made NPO  Objective: Vital signs in last 24 hours: Temp:  [99.3 F (37.4 C)-99.9 F (37.7 C)] 99.5 F (37.5 C) (08/28 0800) Pulse Rate:  [103-117] 106 (08/28 0900) Resp:  [15-27] 23 (08/28 0900) BP: (140-173)/(57-86) 154/72 (08/28 0900) SpO2:  [92 %-98 %] 98 % (08/28 0900) Last BM Date: 05/26/16  Intake/Output from previous day: 08/27 0701 - 08/28 0700 In: 2880 [P.O.:1680; I.V.:1200] Out: 1550 [Urine:1550] Intake/Output this shift: Total I/O In: -  Out: 275 [Urine:275]  General: NAD  Lungs: CTAB  CV: tachycardic  Abd: soft, with obesity, minimal tenderness  Extremities: no edema  Neuro: AOx4  Lab Results: CBC   Recent Labs  05/25/16 1424 05/26/16 0227  WBC 10.2 10.0  HGB 10.8* 10.6*  HCT 33.5* 32.9*  PLT 156 147*   BMET  Recent Labs  05/25/16 0338 05/26/16 0227  NA 140 135  K 3.6 4.0  CL 109 101  CO2 25 26  GLUCOSE 171* 189*  BUN 18 9  CREATININE 0.90 0.73  CALCIUM 8.4* 8.4*   PT/INR  Recent Labs  05/24/16 1216  LABPROT 14.6  INR 1.13   ABG No results for input(s): PHART, HCO3 in the last 72 hours.  Invalid input(s): PCO2, PO2  Studies/Results: No results found.  Anti-infectives: Anti-infectives    None      Medications Scheduled Meds: . docusate sodium  100 mg Oral BID  . enoxaparin (LOVENOX) injection  40 mg Subcutaneous Q24H  . insulin aspart  0-15 Units Subcutaneous TID WC  . insulin aspart  0-5 Units Subcutaneous QHS   Continuous Infusions: . dextrose 5 % and 0.45 % NaCl with KCl 20 mEq/L 50 mL/hr at 05/25/16 2013   PRN Meds:.acetaminophen, iopamidol, morphine injection, ondansetron **OR** ondansetron (ZOFRAN) IV, oxyCODONE, phenol  Assessment/Plan: Fall Open book pelvic FX with sacral FX - Dr.Handy to see today, bedrest S/P angioembolization pelvic hemorrhage ABL anemia - CBC stable x48h L3 endplate FX - LSO when up per  Dr. Ronnald Ramp VTE - PAS, start lovenox FEN - NPO until ortho evaluation DIspo - ICU  LOS: 2 days   [place] Surgeon [phone] Surgery 05/26/2016

## 2016-05-26 NOTE — Progress Notes (Signed)
Inpatient Diabetes Program Recommendations  AACE/ADA: New Consensus Statement on Inpatient Glycemic Control (2015)  Target Ranges:  Prepandial:   less than 140 mg/dL      Peak postprandial:   less than 180 mg/dL (1-2 hours)      Critically ill patients:  140 - 180 mg/dL   Results for Corey Sellers, Corey Sellers (MRN [id number]) as of 05/26/2016 10:46  Ref. Range 05/25/2016 02:13 05/25/2016 09:06 05/25/2016 16:53 05/25/2016 22:09  Glucose-Capillary Latest Ref Range: 65 - 99 mg/dL 175 (H) 208 (H) 210 (H) 208 (H)   Results for Corey Sellers, Corey Sellers (MRN [id number]) as of 05/26/2016 10:46  Ref. Range 05/26/2016 08:07  Glucose-Capillary Latest Ref Range: 65 - 99 mg/dL 198 (H)    Admit with: Sacral/Pelvic Fractures from Fall  History: DM  Home DM Meds: Metformin 1000 mg QPM       Januvia 100 mg QPM  Current Insulin Orders: Novolog Moderate Correction Scale/ SSI (0-15 units) TID AC + HS      -Note patient NPO for surgery today.  -Glucose levels >200 mg/dl.     MD- Please consider starting basal insulin for patient while home oral DM meds are on hold:  Lantus 11 units daily (0.1 units/kg dosing)     --Will follow patient during hospitalization--  Wyn Quaker RN, MSN, CDE Diabetes Coordinator Inpatient Glycemic Control Team Team Pager: [phone] (8a-5p)

## 2016-05-26 NOTE — Progress Notes (Deleted)
Charting error.

## 2016-05-27 ENCOUNTER — Inpatient Hospital Stay (HOSPITAL_COMMUNITY): Payer: BLUE CROSS/BLUE SHIELD | Admitting: Anesthesiology

## 2016-05-27 ENCOUNTER — Inpatient Hospital Stay (HOSPITAL_COMMUNITY): Payer: BLUE CROSS/BLUE SHIELD

## 2016-05-27 ENCOUNTER — Encounter (HOSPITAL_COMMUNITY): Admission: EM | Disposition: A | Discharge status: Rehabilitation Facility | Payer: Self-pay | Source: Home / Self Care

## 2016-05-27 HISTORY — PX: ORIF PELVIC FRACTURE: SHX2128

## 2016-05-27 LAB — TYPE AND SCREEN
ABO/RH(D): B POS
Antibody Screen: NEGATIVE

## 2016-05-27 LAB — GLUCOSE, CAPILLARY
Glucose-Capillary: 164 mg/dL — ABNORMAL HIGH (ref 65–99)
Glucose-Capillary: 178 mg/dL — ABNORMAL HIGH (ref 65–99)
Glucose-Capillary: 192 mg/dL — ABNORMAL HIGH (ref 65–99)
Glucose-Capillary: 192 mg/dL — ABNORMAL HIGH (ref 65–99)
Glucose-Capillary: 213 mg/dL — ABNORMAL HIGH (ref 65–99)

## 2016-05-27 LAB — COMPREHENSIVE METABOLIC PANEL
ALK PHOS: 48 U/L (ref 38–126)
ALT: 39 U/L (ref 17–63)
AST: 24 U/L (ref 15–41)
Albumin: 3.3 g/dL — ABNORMAL LOW (ref 3.5–5.0)
Anion gap: 7 (ref 5–15)
BUN: 10 mg/dL (ref 6–20)
CALCIUM: 8.4 mg/dL — AB (ref 8.9–10.3)
CO2: 27 mmol/L (ref 22–32)
CREATININE: 0.79 mg/dL (ref 0.61–1.24)
Chloride: 101 mmol/L (ref 101–111)
Glucose, Bld: 177 mg/dL — ABNORMAL HIGH (ref 65–99)
Potassium: 4.3 mmol/L (ref 3.5–5.1)
SODIUM: 135 mmol/L (ref 135–145)
Total Bilirubin: 0.8 mg/dL (ref 0.3–1.2)
Total Protein: 6.5 g/dL (ref 6.5–8.1)

## 2016-05-27 LAB — PROTIME-INR
INR: 1.21
Prothrombin Time: 15.4 seconds — ABNORMAL HIGH (ref 11.4–15.2)

## 2016-05-27 LAB — CBC
HCT: 30.1 % — ABNORMAL LOW (ref 39.0–52.0)
Hemoglobin: 9.7 g/dL — ABNORMAL LOW (ref 13.0–17.0)
MCH: 29.2 pg (ref 26.0–34.0)
MCHC: 32.2 g/dL (ref 30.0–36.0)
MCV: 90.7 fL (ref 78.0–100.0)
Platelets: 147 10*3/uL — ABNORMAL LOW (ref 150–400)
RBC: 3.32 MIL/uL — AB (ref 4.22–5.81)
RDW: 13.8 % (ref 11.5–15.5)
WBC: 9.9 10*3/uL (ref 4.0–10.5)

## 2016-05-27 LAB — APTT: aPTT: 36 seconds (ref 24–36)

## 2016-05-27 SURGERY — OPEN REDUCTION INTERNAL FIXATION (ORIF) PELVIC FRACTURE
Anesthesia: General | Site: Abdomen

## 2016-05-27 MED ORDER — SUFENTANIL CITRATE 50 MCG/ML IV SOLN
INTRAVENOUS | Status: DC | PRN
  Administered 2016-05-27: 10 ug via INTRAVENOUS
  Administered 2016-05-27: 40 ug via INTRAVENOUS
  Administered 2016-05-27: 5 ug via INTRAVENOUS

## 2016-05-27 MED ORDER — ACETAMINOPHEN 10 MG/ML IV SOLN
INTRAVENOUS | Status: AC
  Filled 2016-05-27: qty 100

## 2016-05-27 MED ORDER — PROPOFOL 10 MG/ML IV BOLUS
INTRAVENOUS | Status: AC
  Filled 2016-05-27: qty 20

## 2016-05-27 MED ORDER — HYDROCHLOROTHIAZIDE 25 MG PO TABS
25.0000 mg | ORAL_TABLET | Freq: Every day | ORAL | Status: DC
  Administered 2016-05-28 – 2016-05-30 (×3): 25 mg via ORAL
  Filled 2016-05-27 (×3): qty 1

## 2016-05-27 MED ORDER — HYDROMORPHONE HCL 1 MG/ML IJ SOLN
INTRAMUSCULAR | Status: AC
  Filled 2016-05-27: qty 1

## 2016-05-27 MED ORDER — MEPERIDINE HCL 25 MG/ML IJ SOLN: 6.2500 mg | INTRAMUSCULAR | Status: DC | PRN

## 2016-05-27 MED ORDER — PREGABALIN 75 MG PO CAPS
75.0000 mg | ORAL_CAPSULE | Freq: Two times a day (BID) | ORAL | Status: DC
  Filled 2016-05-27 (×6): qty 1

## 2016-05-27 MED ORDER — ROSUVASTATIN CALCIUM 10 MG PO TABS
20.0000 mg | ORAL_TABLET | Freq: Every evening | ORAL | Status: DC
  Administered 2016-05-27 – 2016-05-29 (×3): 20 mg via ORAL
  Filled 2016-05-27 (×2): qty 2
  Filled 2016-05-27: qty 1

## 2016-05-27 MED ORDER — DEXAMETHASONE SODIUM PHOSPHATE 10 MG/ML IJ SOLN
INTRAMUSCULAR | Status: AC
  Filled 2016-05-27: qty 1

## 2016-05-27 MED ORDER — SUFENTANIL CITRATE 50 MCG/ML IV SOLN
INTRAVENOUS | Status: AC
  Filled 2016-05-27: qty 1

## 2016-05-27 MED ORDER — PHENYLEPHRINE 40 MCG/ML (10ML) SYRINGE FOR IV PUSH (FOR BLOOD PRESSURE SUPPORT)
PREFILLED_SYRINGE | INTRAVENOUS | Status: AC
  Filled 2016-05-27: qty 10

## 2016-05-27 MED ORDER — ASPIRIN EC 81 MG PO TBEC
81.0000 mg | DELAYED_RELEASE_TABLET | Freq: Every evening | ORAL | Status: DC
  Administered 2016-05-27 – 2016-05-29 (×3): 81 mg via ORAL
  Filled 2016-05-27 (×3): qty 1

## 2016-05-27 MED ORDER — OLMESARTAN-AMLODIPINE-HCTZ 40-10-25 MG PO TABS: 1.0000 | ORAL_TABLET | Freq: Every evening | ORAL | Status: DC

## 2016-05-27 MED ORDER — MORPHINE SULFATE (PF) 2 MG/ML IV SOLN
2.0000 mg | INTRAVENOUS | Status: DC | PRN
  Administered 2016-05-28 – 2016-05-30 (×2): 2 mg via INTRAVENOUS
  Filled 2016-05-27 (×2): qty 1

## 2016-05-27 MED ORDER — ROCURONIUM BROMIDE 10 MG/ML (PF) SYRINGE
PREFILLED_SYRINGE | INTRAVENOUS | Status: AC
  Filled 2016-05-27: qty 10

## 2016-05-27 MED ORDER — ONDANSETRON HCL 4 MG/2ML IJ SOLN
INTRAMUSCULAR | Status: AC
  Filled 2016-05-27: qty 2

## 2016-05-27 MED ORDER — METFORMIN HCL ER 500 MG PO TB24
1000.0000 mg | ORAL_TABLET | Freq: Every day | ORAL | Status: DC
  Administered 2016-05-27 – 2016-05-29 (×3): 1000 mg via ORAL
  Filled 2016-05-27 (×3): qty 2

## 2016-05-27 MED ORDER — LACTATED RINGERS IV SOLN
INTRAVENOUS | Status: DC | PRN
  Administered 2016-05-27 (×2): via INTRAVENOUS

## 2016-05-27 MED ORDER — LABETALOL HCL 5 MG/ML IV SOLN
INTRAVENOUS | Status: AC
  Filled 2016-05-27: qty 4

## 2016-05-27 MED ORDER — ACETAMINOPHEN 10 MG/ML IV SOLN
1000.0000 mg | Freq: Four times a day (QID) | INTRAVENOUS | Status: AC
  Administered 2016-05-27 – 2016-05-28 (×4): 1000 mg via INTRAVENOUS
  Filled 2016-05-27 (×3): qty 100

## 2016-05-27 MED ORDER — VITAMIN D3 25 MCG (1000 UNIT) PO TABS
4000.0000 [IU] | ORAL_TABLET | Freq: Every evening | ORAL | Status: DC
  Administered 2016-05-27 – 2016-05-29 (×3): 4000 [IU] via ORAL
  Filled 2016-05-27 (×7): qty 4

## 2016-05-27 MED ORDER — HYDROMORPHONE HCL 1 MG/ML IJ SOLN
0.2500 mg | INTRAMUSCULAR | Status: DC | PRN
  Administered 2016-05-27 (×2): 0.5 mg via INTRAVENOUS

## 2016-05-27 MED ORDER — LIDOCAINE HCL (CARDIAC) 20 MG/ML IV SOLN
INTRAVENOUS | Status: DC | PRN
  Administered 2016-05-27: 100 mg via INTRAVENOUS

## 2016-05-27 MED ORDER — LIDOCAINE 2% (20 MG/ML) 5 ML SYRINGE
INTRAMUSCULAR | Status: AC
  Filled 2016-05-27: qty 5

## 2016-05-27 MED ORDER — SODIUM CHLORIDE 0.9 % IJ SOLN
INTRAMUSCULAR | Status: AC
  Filled 2016-05-27: qty 10

## 2016-05-27 MED ORDER — ONDANSETRON HCL 4 MG/2ML IJ SOLN
INTRAMUSCULAR | Status: DC | PRN
  Administered 2016-05-27: 4 mg via INTRAVENOUS

## 2016-05-27 MED ORDER — OXYCODONE HCL 5 MG PO TABS
5.0000 mg | ORAL_TABLET | ORAL | Status: DC | PRN
  Administered 2016-05-27 – 2016-05-29 (×6): 10 mg via ORAL
  Administered 2016-05-29: 15 mg via ORAL
  Administered 2016-05-29: 5 mg via ORAL
  Administered 2016-05-30: 10 mg via ORAL
  Filled 2016-05-27 (×4): qty 2
  Filled 2016-05-27: qty 1
  Filled 2016-05-27 (×2): qty 2
  Filled 2016-05-27: qty 3
  Filled 2016-05-27 (×2): qty 2

## 2016-05-27 MED ORDER — SUCCINYLCHOLINE CHLORIDE 200 MG/10ML IV SOSY
PREFILLED_SYRINGE | INTRAVENOUS | Status: AC
  Filled 2016-05-27: qty 10

## 2016-05-27 MED ORDER — LINAGLIPTIN 5 MG PO TABS
5.0000 mg | ORAL_TABLET | Freq: Every day | ORAL | Status: DC
  Administered 2016-05-28 – 2016-05-30 (×3): 5 mg via ORAL
  Filled 2016-05-27 (×3): qty 1

## 2016-05-27 MED ORDER — ROCURONIUM BROMIDE 100 MG/10ML IV SOLN
INTRAVENOUS | Status: DC | PRN
  Administered 2016-05-27: 50 mg via INTRAVENOUS
  Administered 2016-05-27: 10 mg via INTRAVENOUS
  Administered 2016-05-27: 50 mg via INTRAVENOUS
  Administered 2016-05-27: 10 mg via INTRAVENOUS

## 2016-05-27 MED ORDER — SUGAMMADEX SODIUM 200 MG/2ML IV SOLN
INTRAVENOUS | Status: DC | PRN
  Administered 2016-05-27: 200 mg via INTRAVENOUS

## 2016-05-27 MED ORDER — MIDAZOLAM HCL 2 MG/2ML IJ SOLN
INTRAMUSCULAR | Status: AC
  Filled 2016-05-27: qty 2

## 2016-05-27 MED ORDER — 0.9 % SODIUM CHLORIDE (POUR BTL) OPTIME
TOPICAL | Status: DC | PRN
  Administered 2016-05-27: 1000 mL

## 2016-05-27 MED ORDER — ENOXAPARIN SODIUM 30 MG/0.3ML ~~LOC~~ SOLN
30.0000 mg | Freq: Two times a day (BID) | SUBCUTANEOUS | Status: DC
  Administered 2016-05-27 – 2016-05-30 (×6): 30 mg via SUBCUTANEOUS
  Filled 2016-05-27 (×6): qty 0.3

## 2016-05-27 MED ORDER — IRBESARTAN 300 MG PO TABS
300.0000 mg | ORAL_TABLET | Freq: Every day | ORAL | Status: DC
  Administered 2016-05-28 – 2016-05-30 (×3): 300 mg via ORAL
  Filled 2016-05-27 (×2): qty 1
  Filled 2016-05-27: qty 2
  Filled 2016-05-27: qty 1

## 2016-05-27 MED ORDER — CEFAZOLIN SODIUM-DEXTROSE 2-4 GM/100ML-% IV SOLN
2.0000 g | Freq: Three times a day (TID) | INTRAVENOUS | Status: AC
  Administered 2016-05-27 – 2016-05-28 (×3): 2 g via INTRAVENOUS
  Filled 2016-05-27 (×5): qty 100

## 2016-05-27 MED ORDER — LORATADINE 10 MG PO TABS
10.0000 mg | ORAL_TABLET | Freq: Every day | ORAL | Status: DC
  Administered 2016-05-27 – 2016-05-30 (×3): 10 mg via ORAL
  Filled 2016-05-27 (×4): qty 1

## 2016-05-27 MED ORDER — MIDAZOLAM HCL 5 MG/5ML IJ SOLN
INTRAMUSCULAR | Status: DC | PRN
  Administered 2016-05-27: 2 mg via INTRAVENOUS

## 2016-05-27 MED ORDER — PANTOPRAZOLE SODIUM 40 MG PO TBEC
40.0000 mg | DELAYED_RELEASE_TABLET | Freq: Every day | ORAL | Status: DC
  Administered 2016-05-27 – 2016-05-30 (×4): 40 mg via ORAL
  Filled 2016-05-27 (×4): qty 1

## 2016-05-27 MED ORDER — ONDANSETRON HCL 4 MG/2ML IJ SOLN: 4.0000 mg | Freq: Once | INTRAMUSCULAR | Status: DC | PRN

## 2016-05-27 MED ORDER — PROPOFOL 10 MG/ML IV BOLUS
INTRAVENOUS | Status: DC | PRN
  Administered 2016-05-27: 60 mg via INTRAVENOUS

## 2016-05-27 MED ORDER — AMLODIPINE BESYLATE 10 MG PO TABS
10.0000 mg | ORAL_TABLET | Freq: Every day | ORAL | Status: DC
  Administered 2016-05-28 – 2016-05-30 (×3): 10 mg via ORAL
  Filled 2016-05-27 (×3): qty 1

## 2016-05-27 MED ORDER — CYCLOBENZAPRINE HCL 10 MG PO TABS
10.0000 mg | ORAL_TABLET | Freq: Three times a day (TID) | ORAL | Status: DC | PRN
  Administered 2016-05-28 – 2016-05-29 (×2): 10 mg via ORAL
  Filled 2016-05-27 (×2): qty 1

## 2016-05-27 MED ORDER — LABETALOL HCL 5 MG/ML IV SOLN
INTRAVENOUS | Status: DC | PRN
  Administered 2016-05-27 (×4): 2.5 mg via INTRAVENOUS

## 2016-05-27 MED ORDER — ACETAMINOPHEN 325 MG PO TABS: 650.0000 mg | ORAL_TABLET | Freq: Four times a day (QID) | ORAL | Status: DC | PRN

## 2016-05-27 MED ORDER — POLYETHYLENE GLYCOL 3350 17 G PO PACK
17.0000 g | PACK | Freq: Every day | ORAL | Status: DC
  Administered 2016-05-27 – 2016-05-30 (×4): 17 g via ORAL
  Filled 2016-05-27 (×4): qty 1

## 2016-05-27 MED ORDER — SUGAMMADEX SODIUM 200 MG/2ML IV SOLN
INTRAVENOUS | Status: AC
  Filled 2016-05-27: qty 2

## 2016-05-27 SURGICAL SUPPLY — 60 items
BIT DRILL AO MATTA 2.5MX230M (BIT) ×1 IMPLANT
BLADE SURG ROTATE 9660 (MISCELLANEOUS) IMPLANT
BRUSH SCRUB DISP (MISCELLANEOUS) ×4 IMPLANT
DRAIN CHANNEL 15F RND FF W/TCR (WOUND CARE) ×2 IMPLANT
DRAPE C-ARM 42X72 X-RAY (DRAPES) ×2 IMPLANT
DRAPE C-ARMOR (DRAPES) ×2 IMPLANT
DRAPE IMP U-DRAPE 54X76 (DRAPES) ×2 IMPLANT
DRAPE INCISE IOBAN 66X45 STRL (DRAPES) ×4 IMPLANT
DRAPE LAPAROTOMY TRNSV 102X78 (DRAPE) ×4 IMPLANT
DRAPE U-SHAPE 47X51 STRL (DRAPES) ×4 IMPLANT
DRILL BIT AO MATTA 2.5MX230M (BIT) ×2
DRSG ADAPTIC 3X8 NADH LF (GAUZE/BANDAGES/DRESSINGS) ×2 IMPLANT
DRSG AQUACEL AG ADV 3.5X 6 (GAUZE/BANDAGES/DRESSINGS) ×2 IMPLANT
DRSG MEPILEX BORDER 4X4 (GAUZE/BANDAGES/DRESSINGS) ×2 IMPLANT
DRSG PAD ABDOMINAL 8X10 ST (GAUZE/BANDAGES/DRESSINGS) ×4 IMPLANT
DRSG VAC ATS SM SENSATRAC (GAUZE/BANDAGES/DRESSINGS) ×2 IMPLANT
ELECT REM PT RETURN 9FT ADLT (ELECTROSURGICAL) ×2
ELECTRODE REM PT RTRN 9FT ADLT (ELECTROSURGICAL) ×1 IMPLANT
EVACUATOR SILICONE 100CC (DRAIN) ×2 IMPLANT
GAUZE SPONGE 4X4 12PLY STRL (GAUZE/BANDAGES/DRESSINGS) ×4 IMPLANT
GLOVE BIO SURGEON STRL SZ7.5 (GLOVE) ×2 IMPLANT
GLOVE BIO SURGEON STRL SZ8 (GLOVE) ×2 IMPLANT
GLOVE BIOGEL PI IND STRL 7.5 (GLOVE) ×1 IMPLANT
GLOVE BIOGEL PI IND STRL 8 (GLOVE) ×1 IMPLANT
GLOVE BIOGEL PI INDICATOR 7.5 (GLOVE) ×1
GLOVE BIOGEL PI INDICATOR 8 (GLOVE) ×1
GOWN STRL REUS W/ TWL LRG LVL3 (GOWN DISPOSABLE) ×2 IMPLANT
GOWN STRL REUS W/ TWL XL LVL3 (GOWN DISPOSABLE) ×1 IMPLANT
GOWN STRL REUS W/TWL LRG LVL3 (GOWN DISPOSABLE) ×2
GOWN STRL REUS W/TWL XL LVL3 (GOWN DISPOSABLE) ×1
KIT BASIN OR (CUSTOM PROCEDURE TRAY) ×2 IMPLANT
KIT PREVENA INCISION MGT 13 (CANNISTER) ×2 IMPLANT
KIT ROOM TURNOVER OR (KITS) ×2 IMPLANT
MANIFOLD NEPTUNE II (INSTRUMENTS) ×2 IMPLANT
NS IRRIG 1000ML POUR BTL (IV SOLUTION) ×4 IMPLANT
PACK TOTAL JOINT (CUSTOM PROCEDURE TRAY) ×2 IMPLANT
PACK UNIVERSAL I (CUSTOM PROCEDURE TRAY) ×2 IMPLANT
PAD ARMBOARD 7.5X6 YLW CONV (MISCELLANEOUS) ×4 IMPLANT
PLATE SYMPHYSIS 92.5M 6H (Plate) ×2 IMPLANT
SCREW CANN 7.3X150 32MM THRD (Screw) ×2 IMPLANT
SCREW CORTEX ST MATTA 3.5X22MM (Screw) ×2 IMPLANT
SCREW CORTEX ST MATTA 3.5X28MM (Screw) ×2 IMPLANT
SCREW CORTEX ST MATTA 3.5X30MM (Screw) ×4 IMPLANT
SCREW CORTEX ST MATTA 3.5X38M (Screw) ×2 IMPLANT
SCREW CORTEX ST MATTA 3.5X45MM (Screw) ×2 IMPLANT
SPONGE LAP 18X18 X RAY DECT (DISPOSABLE) IMPLANT
STAPLER VISISTAT 35W (STAPLE) ×2 IMPLANT
SUCTION FRAZIER HANDLE 10FR (MISCELLANEOUS) ×1
SUCTION TUBE FRAZIER 10FR DISP (MISCELLANEOUS) ×1 IMPLANT
SUT VIC AB 0 CT1 27 (SUTURE) ×4
SUT VIC AB 0 CT1 27XBRD ANBCTR (SUTURE) ×4 IMPLANT
SUT VIC AB 1 CT1 18XCR BRD 8 (SUTURE) ×2 IMPLANT
SUT VIC AB 1 CT1 8-18 (SUTURE) ×2
SUT VIC AB 2-0 CT1 27 (SUTURE) ×4
SUT VIC AB 2-0 CT1 TAPERPNT 27 (SUTURE) ×4 IMPLANT
TOWEL OR 17X24 6PK STRL BLUE (TOWEL DISPOSABLE) ×2 IMPLANT
TOWEL OR 17X26 10 PK STRL BLUE (TOWEL DISPOSABLE) ×4 IMPLANT
TRAY FOLEY CATH 16FRSI W/METER (SET/KITS/TRAYS/PACK) IMPLANT
WASHER OIC 13MM 6 PACK (Screw) ×2 IMPLANT
WATER STERILE IRR 1000ML POUR (IV SOLUTION) ×8 IMPLANT

## 2016-05-27 NOTE — Care Management Note (Addendum)
Case Management Note  Patient Details  Name: Corey Sellers MRN: [id number] Date of Birth: 1951-06-25  Subjective/Objective:  Pt admitted on 05/24/16 s/p 8-10 foot fall from scaffolding.  He sustained open book pelvic fx with sacral fx, L3 endplate fx, and pelvic hemorrhage.  PTA, pt independent, lives with spouse.                   Action/Plan: PT/OT consults pending.  Will follow for discharge planning as pt progresses.    Expected Discharge Date:  05/31/16               Expected Discharge Plan:  [place]  In-House Referral:     Discharge planning Services  CM Consult  Post Acute Care Choice:    Choice offered to:     DME Arranged:    DME Agency:     HH Arranged:    HH Agency:     Status of Service:  In process, will continue to follow  If discussed at Long Length of Stay Meetings, dates discussed:    Additional Comments:  Reinaldo Raddle, RN, BSN  Trauma/Neuro ICU Case Manager [phone]

## 2016-05-27 NOTE — Transfer of Care (Signed)
Immediate Anesthesia Transfer of Care Note  Patient: Corey Sellers  Procedure(s) Performed: Procedure(s): OPEN REDUCTION INTERNAL FIXATION (ORIF) SYMPHYSIS PUBIS W/ TRANSSACRAL SCREWS (N/A)  Patient Location: PACU  Anesthesia Type:General  Level of Consciousness: awake, alert , oriented and patient cooperative  Airway & Oxygen Therapy: Patient Spontanous Breathing and Patient connected to face mask oxygen  Post-op Assessment: Report given to RN, Post -op Vital signs reviewed and stable, Patient moving all extremities and Patient moving all extremities X 4  Post vital signs: Reviewed and stable  Last Vitals:  Vitals:   05/27/16 0500 05/27/16 0600  BP: 138/64 137/65  Pulse: (!) 108 (!) 106  Resp: (!) 24 (!) 21  Temp:      Last Pain:  Vitals:   05/27/16 0400  TempSrc: Oral  PainSc:       Patients Stated Pain Goal: 3 ([id number])  Complications: No apparent anesthesia complications

## 2016-05-27 NOTE — Progress Notes (Addendum)
Patient ID: Corey Sellers, male   DOB: 1951-01-09, 65 y.o.   MRN: [id number]   LOS: 3 days   Subjective: Doing well, mild pelvic pain.   Objective: Vital signs in last 24 hours: Temp:  [99.2 F (37.3 C)-101 F (38.3 C)] 101 F (38.3 C) (08/29 1251) Pulse Rate:  [98-122] 101 (08/29 1222) Resp:  [15-27] 20 (08/29 1222) BP: (70-170)/(52-79) 123/58 (08/29 1208) SpO2:  [91 %-100 %] 96 % (08/29 1222) Last BM Date: 05/26/16   Laboratory  CBC  Recent Labs  05/26/16 0227 05/27/16 0311  WBC 10.0 9.9  HGB 10.6* 9.7*  HCT 32.9* 30.1*  PLT 147* 147*   BMET  Recent Labs  05/26/16 0227 05/27/16 0311  NA 135 135  K 4.0 4.3  CL 101 101  CO2 26 27  GLUCOSE 189* 177*  BUN 9 10  CREATININE 0.73 0.79  CALCIUM 8.4* 8.4*   CBG (last 3)   Recent Labs  05/27/16 0714 05/27/16 1121 05/27/16 1247  GLUCAP 164* 178* 192*    Physical Exam General appearance: alert and no distress Resp: clear to auscultation bilaterally Cardio: regular rate and rhythm GI: normal findings: bowel sounds normal and soft, non-tender Extremities: NVI   Assessment/Plan: Fall Open book pelvic FX with sacral FX s/p ORIF- per Dr.Handy, WBAT on LLE for transfers only, NWB RLE S/P angioembolization pelvic hemorrhage ABL anemia- Hgb with 1g drop, will follow L3 endplate FX - LSO when up per Dr. Ronnald Ramp Multiple medical problems -- Restart home meds FEN- Advance diet VTE- SCD's, Lovenox DIspo- Transfer to floor, PT/OT consults    Lisette Abu, PA-C Pager: [phone] General Trauma PA Pager: [phone]  05/27/2016   Seen together and agree. Noted weight bearing restrictions. Transfer to floor. We also spoke with his wife.  Georganna Skeans, MD, MPH, FACS Trauma: [phone] General Surgery: [phone]  05/27/2016 1:32 PM

## 2016-05-27 NOTE — Brief Op Note (Signed)
05/24/2016 - 05/27/2016  2:39 PM  PATIENT:  Corey Sellers  65 y.o. male  PRE-OPERATIVE DIAGNOSIS:  UNSTABLE PELVIC RING, ANTERIOR AND POSTERIOR  POST-OPERATIVE DIAGNOSIS:  UNSTABLE PELVIC RING, ANTERIOR AND POSTERIOR  PROCEDURE:  Procedure(s): 1. OPEN REDUCTION INTERNAL FIXATION (ORIF) SYMPHYSIS PUBIS 2. TRANSSACRAL SCREW (N/A) RIGHT AND LEFT 3. APPLICATION SMALL WOUND VAC  SURGEON:  Surgeon(s) and Role:    * Altamese Jenkins, MD - Primary  PHYSICIAN ASSISTANT: Ainsley Spinner, PAC  ANESTHESIA:   general  EBL:  Total I/O In: 1800 [I.V.:1800] Out: 1410 [Urine:1300; Blood:110]  BLOOD ADMINISTERED:none  DRAINS: Wound vac  LOCAL MEDICATIONS USED:  NONE  SPECIMEN:  No Specimen  DISPOSITION OF SPECIMEN:  N/A  COUNTS:  YES  TOURNIQUET:  * No tourniquets in log *  DICTATION: .Other Dictation: Dictation Number complete with no number given   PLAN OF CARE: Admit to inpatient   PATIENT DISPOSITION:  PACU - hemodynamically stable.   Delay start of Pharmacological VTE agent (>24hrs) due to surgical blood loss or risk of bleeding: No

## 2016-05-27 NOTE — Anesthesia Procedure Notes (Signed)
Procedure Name: Intubation Date/Time: 05/27/2016 8:34 AM Performed by: Izora Gala Pre-anesthesia Checklist: Patient identified, Emergency Drugs available, Suction available and Patient being monitored Patient Re-evaluated:Patient Re-evaluated prior to inductionOxygen Delivery Method: Circle system utilized Preoxygenation: Pre-oxygenation with 100% oxygen Intubation Type: IV induction Ventilation: Mask ventilation without difficulty Laryngoscope Size: Miller and 3 Grade View: Grade II Tube size: 7.5 mm Number of attempts: 1 Airway Equipment and Method: Stylet Placement Confirmation: ETT inserted through vocal cords under direct vision,  positive ETCO2 and breath sounds checked- equal and bilateral Secured at: 23 cm Tube secured with: Tape Dental Injury: Teeth and Oropharynx as per pre-operative assessment

## 2016-05-27 NOTE — Anesthesia Preprocedure Evaluation (Signed)
Anesthesia Evaluation  Patient identified by MRN, date of birth, ID band Patient awake    Reviewed: Allergy & Precautions, NPO status , Patient's Chart, lab work & pertinent test results  Airway Mallampati: I  TM Distance: >3 FB Neck ROM: Full    Dental   Pulmonary former smoker,    Pulmonary exam normal        Cardiovascular hypertension, Normal cardiovascular exam     Neuro/Psych    GI/Hepatic GERD  Medicated and Controlled,  Endo/Other  diabetes, Type 2, Insulin Dependent  Renal/GU      Musculoskeletal   Abdominal   Peds  Hematology   Anesthesia Other Findings   Reproductive/Obstetrics                             Anesthesia Physical Anesthesia Plan  ASA: III  Anesthesia Plan: General   Post-op Pain Management:    Induction: Intravenous  Airway Management Planned: Oral ETT  Additional Equipment:   Intra-op Plan:   Post-operative Plan: Possible Post-op intubation/ventilation  Informed Consent: I have reviewed the patients History and Physical, chart, labs and discussed the procedure including the risks, benefits and alternatives for the proposed anesthesia with the patient or authorized representative who has indicated his/her understanding and acceptance.     Plan Discussed with: CRNA and Surgeon  Anesthesia Plan Comments:         Anesthesia Quick Evaluation

## 2016-05-28 ENCOUNTER — Encounter (HOSPITAL_COMMUNITY): Payer: Self-pay | Admitting: Orthopedic Surgery

## 2016-05-28 DIAGNOSIS — S329XXA Fracture of unspecified parts of lumbosacral spine and pelvis, initial encounter for closed fracture: Secondary | ICD-10-CM

## 2016-05-28 DIAGNOSIS — D696 Thrombocytopenia, unspecified: Secondary | ICD-10-CM

## 2016-05-28 DIAGNOSIS — R339 Retention of urine, unspecified: Secondary | ICD-10-CM | POA: Diagnosis not present

## 2016-05-28 DIAGNOSIS — S3282XA Multiple fractures of pelvis without disruption of pelvic ring, initial encounter for closed fracture: Secondary | ICD-10-CM | POA: Diagnosis present

## 2016-05-28 DIAGNOSIS — S32039A Unspecified fracture of third lumbar vertebra, initial encounter for closed fracture: Secondary | ICD-10-CM | POA: Diagnosis present

## 2016-05-28 DIAGNOSIS — S32811D Multiple fractures of pelvis with unstable disruption of pelvic ring, subsequent encounter for fracture with routine healing: Secondary | ICD-10-CM

## 2016-05-28 DIAGNOSIS — W19XXXD Unspecified fall, subsequent encounter: Secondary | ICD-10-CM

## 2016-05-28 DIAGNOSIS — G8918 Other acute postprocedural pain: Secondary | ICD-10-CM

## 2016-05-28 DIAGNOSIS — W19XXXA Unspecified fall, initial encounter: Secondary | ICD-10-CM | POA: Diagnosis present

## 2016-05-28 DIAGNOSIS — S32810D Multiple fractures of pelvis with stable disruption of pelvic ring, subsequent encounter for fracture with routine healing: Secondary | ICD-10-CM

## 2016-05-28 DIAGNOSIS — N501 Vascular disorders of male genital organs: Secondary | ICD-10-CM

## 2016-05-28 DIAGNOSIS — S32039D Unspecified fracture of third lumbar vertebra, subsequent encounter for fracture with routine healing: Secondary | ICD-10-CM

## 2016-05-28 DIAGNOSIS — Z419 Encounter for procedure for purposes other than remedying health state, unspecified: Secondary | ICD-10-CM

## 2016-05-28 DIAGNOSIS — R6510 Systemic inflammatory response syndrome (SIRS) of non-infectious origin without acute organ dysfunction: Secondary | ICD-10-CM

## 2016-05-28 DIAGNOSIS — E119 Type 2 diabetes mellitus without complications: Secondary | ICD-10-CM

## 2016-05-28 DIAGNOSIS — I10 Essential (primary) hypertension: Secondary | ICD-10-CM

## 2016-05-28 DIAGNOSIS — IMO0002 Reserved for concepts with insufficient information to code with codable children: Secondary | ICD-10-CM

## 2016-05-28 DIAGNOSIS — D62 Acute posthemorrhagic anemia: Secondary | ICD-10-CM

## 2016-05-28 LAB — CBC
HCT: 25.7 % — ABNORMAL LOW (ref 39.0–52.0)
Hemoglobin: 8.2 g/dL — ABNORMAL LOW (ref 13.0–17.0)
MCH: 29.1 pg (ref 26.0–34.0)
MCHC: 31.9 g/dL (ref 30.0–36.0)
MCV: 91.1 fL (ref 78.0–100.0)
PLATELETS: 133 10*3/uL — AB (ref 150–400)
RBC: 2.82 MIL/uL — ABNORMAL LOW (ref 4.22–5.81)
RDW: 13.9 % (ref 11.5–15.5)
WBC: 7.8 10*3/uL (ref 4.0–10.5)

## 2016-05-28 LAB — GLUCOSE, CAPILLARY
GLUCOSE-CAPILLARY: 171 mg/dL — AB (ref 65–99)
GLUCOSE-CAPILLARY: 132 mg/dL — AB (ref 65–99)
Glucose-Capillary: 174 mg/dL — ABNORMAL HIGH (ref 65–99)
Glucose-Capillary: 159 mg/dL — ABNORMAL HIGH (ref 65–99)

## 2016-05-28 MED ORDER — BETHANECHOL CHLORIDE 25 MG PO TABS
25.0000 mg | ORAL_TABLET | Freq: Four times a day (QID) | ORAL | Status: DC
  Administered 2016-05-28 – 2016-05-30 (×9): 25 mg via ORAL
  Filled 2016-05-28 (×9): qty 1

## 2016-05-28 MED ORDER — ACETAMINOPHEN 500 MG PO TABS
1000.0000 mg | ORAL_TABLET | Freq: Four times a day (QID) | ORAL | Status: DC | PRN
  Administered 2016-05-28 – 2016-05-29 (×3): 1000 mg via ORAL
  Filled 2016-05-28 (×3): qty 2

## 2016-05-28 MED ORDER — TAMSULOSIN HCL 0.4 MG PO CAPS
0.8000 mg | ORAL_CAPSULE | Freq: Every day | ORAL | Status: DC
  Administered 2016-05-28 – 2016-05-30 (×3): 0.8 mg via ORAL
  Filled 2016-05-28 (×3): qty 2

## 2016-05-28 MED ORDER — IOPAMIDOL (ISOVUE-300) INJECTION 61%
100.0000 mL | Freq: Once | INTRAVENOUS | Status: AC | PRN
  Administered 2016-05-24: 100 mL via INTRAVENOUS

## 2016-05-28 NOTE — Evaluation (Signed)
Physical Therapy Evaluation Patient Details Name: Corey Sellers MRN: [id number] DOB: [birth year] Today's Date: 05/28/2016   History of Present Illness  pt admitted after falling from scaffolding while building a [employer], resulting in pelvic fx, L3 end plate fx and pelvic hemorrhage. s/p symphysis pubis, transsacral screw and application of wound vac. Pt with significant scrotal swelling.  Clinical Impression  Pt is up to bedside with PT and OT in conjunction to ease his transition to stand, but is very motivated to move more as he can.  Pt is good candidate for CIR as he has family with him and had excellent PLOF.  Will continue acutely to work on donning brace in bed, using good body mechanics to protect his spinal precautions with sacrum and to increase strength of injured limbs to facilitate more independence with mobiltiy for home.    Follow Up Recommendations CIR    Equipment Recommendations  None recommended by PT (await rehab decisions at DC)    Recommendations for Other Services Rehab consult     Precautions / Restrictions Precautions Precautions: Back;Fall;Other (comment) (wound vac on abdomen) Precaution Booklet Issued: No Precaution Comments: instructed in log roll technique, care taken with scrotum with mobility Required Braces or Orthoses: Spinal Brace Spinal Brace: Thoracolumbosacral orthotic;Applied in supine position Restrictions Weight Bearing Restrictions: Yes RLE Weight Bearing: Non weight bearing LLE Weight Bearing: Weight bearing as tolerated Other Position/Activity Restrictions: Pt could maintain NWB on RLE with 2 person assist      Mobility  Bed Mobility Overal bed mobility: Needs Assistance;+2 for physical assistance;+ 2 for safety/equipment Bed Mobility: Rolling;Sidelying to Sit;Sit to Sidelying Rolling: Mod assist;+2 for physical assistance;+2 for safety/equipment Sidelying to sit: Max assist;+2 for physical assistance;+2 for  safety/equipment     Sit to sidelying: Max assist;+2 for physical assistance;+2 for safety/equipment General bed mobility comments: rolled to apply and remove brace, used log roll technique assisting with LEs and trunk to sit EOB, used pillow between knees to protect scrotum and assisted hips with bed pad  Transfers Overall transfer level: Needs assistance Equipment used: Rolling walker (2 wheeled);2 person hand held assist (PT supported L knee with her knee) Transfers: Sit to/from Stand Sit to Stand: Max assist;+2 physical assistance;+2 safety/equipment;From elevated surface         General transfer comment: assist with pad under hips, L knee blocked, walker stabilized as pt pulled up, cues for NWB on R LE  Ambulation/Gait             General Gait Details: unable to attempt due to pain and NWB on RLE  Stairs            Wheelchair Mobility    Modified Rankin (Stroke Patients Only)       Balance Overall balance assessment: Needs assistance;History of Falls Sitting-balance support: Feet supported Sitting balance-Leahy Scale: Poor Sitting balance - Comments: support of 2 and correction of midline due to severe pain and difficulty controlling with TLSO     Standing balance-Leahy Scale: Poor Standing balance comment: stood with NWB on RLE with 2 assist, help on each UE and support to block L knee                             Pertinent Vitals/Pain Pain Assessment: Faces Pain Score: 10-Worst pain ever Faces Pain Scale: Hurts worst Pain Location: scrotum and pelvic fracture areas Pain Descriptors / Indicators: Aching;Operative site guarding;Grimacing;Guarding Pain Intervention(s): Monitored during session;Limited  activity within patient's tolerance;Premedicated before session;Repositioned;Relaxation    Home Living Family/patient expects to be discharged to:: Private residence Living Arrangements: Spouse/significant other Available Help at Discharge:  Family;Available PRN/intermittently Type of Home: House Home Access: Stairs to enter Entrance Stairs-Rails: None Entrance Stairs-Number of Steps: 4 down Home Layout: Two level;Able to live on main level with bedroom/bathroom Home Equipment: None      Prior Function Level of Independence: Independent         Comments: 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     Hand Dominance   Dominant Hand: Right    Extremity/Trunk Assessment   Upper Extremity Assessment: Overall WFL for tasks assessed           Lower Extremity Assessment: RLE deficits/detail RLE Deficits / Details: pain to move RLE and did not formally mm test, NWB    Cervical / Trunk Assessment: Other exceptions (has TLSO in place to get up from bed, very straight posture )  Communication   Communication: No difficulties  Cognition Arousal/Alertness: Awake/alert Behavior During Therapy: WFL for tasks assessed/performed Overall Cognitive Status: Within Functional Limits for tasks assessed                      General Comments General comments (skin integrity, edema, etc.): Pt is struggling with severe pain and his injuries, good candidate for CIR due to his PLOF and support with wife    Exercises        Assessment/Plan    PT Assessment Patient needs continued PT services  PT Diagnosis Acute pain;Difficulty walking   PT Problem List Decreased strength;Decreased range of motion;Decreased activity tolerance;Decreased balance;Decreased mobility;Decreased coordination;Decreased knowledge of use of DME;Decreased knowledge of precautions;Pain;Decreased skin integrity  PT Treatment Interventions DME instruction;Functional mobility training;Therapeutic activities;Therapeutic exercise;Balance training;Neuromuscular re-education;Patient/family education   PT Goals (Current goals can be found in the Care Plan section) Acute Rehab PT Goals Patient Stated Goal: decrease pain PT Goal Formulation: With  patient Time For Goal Achievement: 06/11/16 Potential to Achieve Goals: Good    Frequency Min 3X/week   Barriers to discharge Inaccessible home environment;Other (comment);Decreased caregiver support (needs 2 person assist which wife cannot give him)      Co-evaluation PT/OT/SLP Co-Evaluation/Treatment: Yes Reason for Co-Treatment: Complexity of the patient's impairments (multi-system involvement);For patient/therapist safety PT goals addressed during session: Mobility/safety with mobility;Proper use of DME;Balance OT goals addressed during session: Strengthening/ROM       End of Session Equipment Utilized During Treatment: Other (comment) (spinal brace) Activity Tolerance: Patient limited by pain;Treatment limited secondary to medical complications (Comment) (RLE NWB) Patient left: in bed;with call bell/phone within reach;with bed alarm set Nurse Communication: Mobility status;Patient requests pain meds         Time: 0851-0930 PT Time Calculation (min) (ACUTE ONLY): 39 min   Charges:   PT Evaluation $PT Eval High Complexity: 1 Procedure     PT G Codes:        Ramond Dial 06/27/2016, 11:16 AM    Mee Hives, PT MS Acute Rehab Dept. Number: [place]

## 2016-05-28 NOTE — Evaluation (Signed)
Occupational Therapy Evaluation Patient Details Name: Corey Sellers MRN: [id number] DOB: Aug 18, 1951 Today's Date: 05/28/2016    History of Present Illness pt admitted after falling from scaffolding while building a [employer], resulting in pelvic fx, L3 end plate fx and pelvic hemorrhage. s/p symphysis pubis, transsacral screw and application of wound vac. Pt with significant scrotal swelling.   Clinical Impression   Pt was independent prior to admission. Presents with intense pain and generalized weakness. He currently requires 2 person assist for all mobility. He was able to sit EOB and stand briefly this visit, but could not tolerate transfer to chair. Pt was highly motivated and cooperative and would be an excellent inpatient rehab candidate. Will follow acutely.    Follow Up Recommendations  CIR;Supervision/Assistance - 24 hour    Equipment Recommendations  3 in 1 bedside comode;Wheelchair (measurements OT);Wheelchair cushion (measurements OT)    Recommendations for Other Services Rehab consult     Precautions / Restrictions Precautions Precautions: Back;Fall Precaution Comments: instructed in log roll technique, care taken with scrotum with mobility Required Braces or Orthoses: Spinal Brace Spinal Brace: Thoracolumbosacral orthotic;Applied in supine position Restrictions Weight Bearing Restrictions: Yes RLE Weight Bearing: Non weight bearing LLE Weight Bearing: Weight bearing as tolerated      Mobility Bed Mobility Overal bed mobility: Needs Assistance;+2 for physical assistance Bed Mobility: Rolling;Sidelying to Sit;Sit to Sidelying Rolling: +2 for physical assistance;Mod assist Sidelying to sit: +2 for physical assistance;Max assist     Sit to sidelying: +2 for physical assistance;Max assist General bed mobility comments: rolled to apply and remove brace, used log roll technique assisting with LEs and trunk to sit EOB, used pillow between knees to  protect scrotum and assisted hips with bed pad  Transfers Overall transfer level: Needs assistance Equipment used: Rolling walker (2 wheeled) Transfers: Sit to/from Stand Sit to Stand: +2 physical assistance;Max assist         General transfer comment: assist with pad under hips, L knee blocked, walker stabilized as pt pulled up, cues for NWB on R LE    Balance Overall balance assessment: Needs assistance Sitting-balance support: Bilateral upper extremity supported Sitting balance-Leahy Scale: Poor Sitting balance - Comments: pt requiring B UE support due to pain     Standing balance-Leahy Scale: Poor Standing balance comment: stood briefly due to pain approximately 20 seconds                            ADL Overall ADL's : Needs assistance/impaired Eating/Feeding: Independent;Bed level   Grooming: Wash/dry hands;Wash/dry face;Bed level;Set up   Upper Body Bathing: Maximal assistance;Bed level   Lower Body Bathing: Total assistance;Bed level   Upper Body Dressing : Maximal assistance;Bed level   Lower Body Dressing: Total assistance;Bed level                       Vision     Perception     Praxis      Pertinent Vitals/Pain Pain Assessment: Faces Faces Pain Scale: Hurts worst Pain Location: scrotum, pelvis Pain Descriptors / Indicators: Aching;Grimacing;Guarding Pain Intervention(s): Limited activity within patient's tolerance;Monitored during session;Repositioned;Patient requesting pain meds-RN notified     Hand Dominance Right   Extremity/Trunk Assessment Upper Extremity Assessment Upper Extremity Assessment: Overall WFL for tasks assessed   Lower Extremity Assessment Lower Extremity Assessment: Defer to PT evaluation       Communication Communication Communication: No difficulties   Cognition Arousal/Alertness:  Awake/alert Behavior During Therapy: WFL for tasks assessed/performed Overall Cognitive Status: Within Functional  Limits for tasks assessed                     General Comments       Exercises       Shoulder Instructions      Home Living Family/patient expects to be discharged to:: Private residence Living Arrangements: Spouse/significant other Available Help at Discharge: Family;Available PRN/intermittently Type of Home: House Home Access: Stairs to enter [employer] of Steps: 4 down Entrance Stairs-Rails: None Home Layout: Two level;Able to live on main level with bedroom/bathroom               Home Equipment: None          Prior Functioning/Environment Level of Independence: Independent             OT Diagnosis: Generalized weakness;Acute pain   OT Problem List: Decreased strength;Decreased activity tolerance;Impaired balance (sitting and/or standing);Decreased knowledge of use of DME or AE;Decreased knowledge of precautions;Pain;Increased edema   OT Treatment/Interventions: Self-care/ADL training;DME and/or AE instruction;Therapeutic activities;Patient/family education;Balance training    OT Goals(Current goals can be found in the care plan section) Acute Rehab OT Goals Patient Stated Goal: decrease pain OT Goal Formulation: With patient Time For Goal Achievement: 06/11/16 Potential to Achieve Goals: Good ADL Goals Pt Will Perform Grooming: with supervision;sitting Pt Will Transfer to Toilet: with mod assist;stand pivot transfer;bedside commode Additional ADL Goal #1: Pt will roll with min guard assist to assist with donning brace/positioning/ADL. Additional ADL Goal #2: Pt will sit EOB x 10 minutes without UB support in preparation for ADL.  OT Frequency: Min 2X/week   Barriers to D/C:            Co-evaluation PT/OT/SLP Co-Evaluation/Treatment: Yes Reason for Co-Treatment: Complexity of the patient's impairments (multi-system involvement)   OT goals addressed during session: Strengthening/ROM      End of Session Equipment Utilized  During Treatment: Rolling walker;Oxygen;Back brace (2L) Nurse Communication: Patient requests pain meds  Activity Tolerance: Patient limited by pain Patient left: in bed;with call bell/phone within reach;with bed alarm set   Time: [id number] OT Time Calculation (min): 39 min Charges:  OT General Charges $OT Visit: 1 Procedure OT Evaluation $OT Eval Moderate Complexity: 1 Procedure OT Treatments $Therapeutic Activity: 8-22 mins G-Codes:    Malka So 05/28/2016, 10:25 AM [phone]

## 2016-05-28 NOTE — Progress Notes (Signed)
Patient ID: Corey Sellers, male   DOB: [birth year], 65 y.o.   MRN: [id number]   LOS: 4 days   Subjective: C/o right lower rib pain, otherwise ok.   Objective: Vital signs in last 24 hours: Temp:  [99.2 F (37.3 C)-102.4 F (39.1 C)] 100.6 F (38.1 C) (08/30 0601) Pulse Rate:  [98-118] 106 (08/30 0601) Resp:  [16-33] 18 (08/30 0601) BP: (120-164)/(43-70) 144/70 (08/30 0601) SpO2:  [93 %-98 %] 97 % (08/30 0601) Last BM Date: 05/26/16   Laboratory  CBC  Recent Labs  05/27/16 0311 05/28/16 0459  WBC 9.9 7.8  HGB 9.7* 8.2*  HCT 30.1* 25.7*  PLT 147* 133*   CBG (last 3)   Recent Labs  05/27/16 1655 05/27/16 2358 05/28/16 0612  GLUCAP 213* 192* 174*    Physical Exam General appearance: alert and no distress Resp: clear to auscultation bilaterally Cardio: regular rate and rhythm GI: Soft, +BS Extremities: NVI   Assessment/Plan: Fall L3 endplate FX - LSO when up per Dr. Ronnald Ramp Open book pelvic FX with sacral FX s/p ORIF- per Dr.Handy, WBAT on LLE for transfers only, NWB RLE S/P angioembolization pelvic hemorrhage ABL anemia- Hgb down again, continue to follow Urinary retention -- Start Flomax and urecholine Multiple medical problems -- Home meds FEN- Advance to fulls VTE- SCD's, Lovenox DIspo- PT/OT consults    Lisette Abu, PA-C Pager: [phone] General Trauma PA Pager: [phone]  05/28/2016

## 2016-05-28 NOTE — Progress Notes (Signed)
CSW now following for disposition needs and to facilitate SNF placement if/when appropriate.     Emiliano Dyer, LCSW [facility] ED/[facility]M Clinical Social Worker [phone]

## 2016-05-28 NOTE — Progress Notes (Signed)
Rehab Admissions Coordinator Note:  Patient was screened by Cleatrice Burke for appropriateness for an Inpatient Acute Rehab Consult per OT recommendation. At this time, we are recommending Inpatient Rehab consult.  Cleatrice Burke 05/28/2016, 11:06 AM  I can be reached at [phone].

## 2016-05-28 NOTE — Progress Notes (Signed)
Orthopaedic [facility]uma [facility] Progress Note  Subjective  Doing ok Some R lower rib pain Abdomen and pelvis are sore C/o scrotal swelling as well- difficult to move around in bed   ROS As above  Objective   BP (!) 144/70 (BP Location: Left Arm)   Pulse (!) 106   Temp (!) 100.6 F (38.1 C) (Oral)   Resp 18   Ht 5\' 9"  (1.753 m)   Wt 112 kg (247 lb)   SpO2 97%   BMI 36.48 kg/m   Intake/Output      08/29 0701 - 08/30 0700 08/30 0701 - 08/31 0700   P.O.     I.V. (mL/kg) 1800 (16.1)    IV Piggyback 500    Total Intake(mL/kg) 2300 (20.5)    Urine (mL/kg/hr) 2750 (1) 1500 (5.5)   Drains  0 (0)   Blood 110 (0)    Total Output 2860 1500   Net -560 -1500          Labs Results for AMEET, GEDEON (MRN [id number]) as of 05/28/2016 09:27  Ref. Range 05/28/2016 04:59  WBC Latest Ref Range: 4.0 - 10.5 K/uL 7.8  RBC Latest Ref Range: 4.22 - 5.81 MIL/uL 2.82 (L)  Hemoglobin Latest Ref Range: 13.0 - 17.0 g/dL 8.2 (L)  HCT Latest Ref Range: 39.0 - 52.0 % 25.7 (L)  MCV Latest Ref Range: 78.0 - 100.0 fL 91.1  MCH Latest Ref Range: 26.0 - 34.0 pg 29.1  MCHC Latest Ref Range: 30.0 - 36.0 g/dL 31.9  RDW Latest Ref Range: 11.5 - 15.5 % 13.9  Platelets Latest Ref Range: 150 - 400 K/uL 133 (L)   CBG (last 3)   Recent Labs  05/27/16 1655 05/27/16 2358 05/28/16 0612  GLUCAP 213* 192* 174*      Exam  Gen: awake and alert, resting comfortably in bed, NAD Lungs: clear anterior fields Cardiac: RRR Abd: + BS, NT Pelvis: incision vac dressing intact. No drainage appreciated in VAC canister   Moderate scrotal swelling and ecchymosis  Ext:       B Lower Extremities  DPN, SPN, TN sensation intact B  EHL intact B  FHL, AT, PT, peroneals, gastroc motor intact B   exts are warm  + DP pulses B   No significant swelling     Assessment and Plan   POD/HD#: 40  65 year old white male status post fall off of scaffolding with complex pelvic ring injury, status post IR embolization of  left internal iliac artery  -APC 2 pelvic ring injury s/p ORIF                        NWB R lower extremity             WBAT Left leg for transfers only             ROM as tolerated             Ice PRN                        PT and OT evaluations                         Patient is at risk for a nonunion or deep infection given his diabetes. We did discuss the importance of maintaining sugars below 200 mg/dL to decrease his risk of developing deep infection and/or nonunion  - Pain management:  Continue per trauma service  - ABL anemia/Hemodynamics                       CBC in the morning                         - Medical issues  Diabetes   Sliding scale    Home meds to be restarted   - DVT/PE prophylaxis:                       SCDs and Lovenox                     start coumadin once H/H stabilizes   - ID:                        Perioperative Ancef   - Activity    Therapy evals   - FEN/GI prophylaxis/Foley/Lines:                       full liquid diet                        continue with foley. Pt with urinary retention    meds started    Voiding trial in next day or 2   - Dispo:                        therapies     Jari Pigg, PA-C [facility] [phone] (O) 05/28/2016 9:26 AM

## 2016-05-28 NOTE — Consult Note (Signed)
Physical Medicine and Rehabilitation Consult Reason for Consult: Multitrauma after fall with pelvic fracture, L3 endplate fracture and pelvic hemorrhage Referring Physician: Trauma services   HPI: Corey Sellers is a 65 y.o. right handed male with history of hypertension, diabetes mellitus. Per chart review patient lives with spouse independent prior to admission. 2 level home with bedroom first floor. Four steps to entry. Wife works day shift. Presented 05/24/2016 after a fall 8-10 feet from scaffolding while building a [employer] home. Denied loss of consciousness. X-rays and imaging revealed pelvic diastasis with pelvic hematoma, right sacral fracture, L3 superior endplate fracture. Status post angioembolization pelvic hemorrhage for interventional radiology. Underwent ORIF of anterior pubic symphysis, screw fixation right left application of small wound VAC 05/27/2016 per Dr. Marcelino Scot. Weightbearing as tolerated left lower extremity for transfers only and nonweightbearing right lower extremity. Subcutaneous Lovenox for DVT prophylaxis. LSO brace placed for L3 endplate fracture per neurosurgery Dr. Sherley Bounds. Hospital course pain management. Acute blood loss anemia 8.2. Physical and occupational therapy evaluation completed 05/28/2016 with recommendations of physical medicine rehabilitation consult  Review of Systems  Constitutional: Negative for chills and fever.  HENT: Negative for hearing loss.   Eyes: Negative for blurred vision and double vision.  Respiratory: Negative for cough and shortness of breath.   Cardiovascular: Negative for chest pain, palpitations and leg swelling.  Gastrointestinal: Positive for constipation. Negative for nausea and vomiting.  Genitourinary: Positive for urgency. Negative for dysuria and hematuria.  Musculoskeletal: Positive for myalgias.  Skin: Negative for rash.  Neurological: Negative for seizures, loss of consciousness, weakness and  headaches.  All other systems reviewed and are negative.  Past Medical History:  Diagnosis Date  . Diabetes mellitus   . GERD (gastroesophageal reflux disease)   . Hypertension   . 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 Wears glasses    Past Surgical History:  Procedure Laterality Date  . APPENDECTOMY    . BASAL CELL CARCINOMA EXCISION     left chin  . EXCISION METACARPAL MASS Right 05/23/2013   Procedure: RIGHT LONG EXCISION ANNULAR LIGAMENT CYST;  Surgeon: Tennis Must, MD;  Location: [facility];  Service: Orthopedics;  Laterality: Right;  . GANGLION CYST EXCISION Left 06/06/2013   Procedure: LEFT WRIST EXCISION MASS;  Surgeon: Tennis Must, MD;  Location: [facility];  Service: Orthopedics;  Laterality: Left;  . IR GENERIC HISTORICAL  05/24/2016   IR ANGIOGRAM PELVIS SELECTIVE OR SUPRASELECTIVE 05/24/2016 Arne Cleveland, MD [facility]  . IR GENERIC HISTORICAL  05/24/2016   IR US GUIDE VASC ACCESS RIGHT 05/24/2016 Arne Cleveland, MD [facility]  . IR GENERIC HISTORICAL  05/24/2016   IR EMBO ART  VEN HEMORR LYMPH EXTRAV  INC GUIDE ROADMAPPING 05/24/2016 Arne Cleveland, MD [facility]  . JOINT REPLACEMENT  2003,2004   Rt shoulder  . PAROTIDECTOMY W/ NECK DISSECTION TOTAL  2013   left parotidectomy-26 nodes removed  . TONSILLECTOMY    . TRIGGER FINGER RELEASE Right 05/23/2013   Procedure: RELEASE TRIGGER FINGER/A-1 PULLEY RIGHT INDEX AND LONG;  Surgeon: Tennis Must, MD;  Location: [facility];  Service: Orthopedics;  Laterality: Right;  . TRIGGER FINGER RELEASE Left 06/06/2013   Procedure: RELEASE TRIGGER FINGER/A-1 PULLEY LEFT SMALL;  Surgeon: Tennis Must, MD;  Location: [facility];  Service: Orthopedics;  Laterality: Left;  Marland Kitchen VARICOCELE EXCISION  left   History reviewed. No pertinent family history. Social History:  reports that he quit smoking about 17 years ago. He  does not have any smokeless tobacco history on file. He reports that he drinks alcohol. He reports that he does not use drugs. Allergies:  Allergies  Allergen Reactions  . Bee Venom Anaphylaxis  . Other Rash and Other (See Comments)    METALS. Patient has metal sensitivity, including metal sutures.  Causes severe irritation. Metal sensitivity - redness and irritation at site when staples used in incision closure   Medications Prior to Admission  Medication Sig Dispense Refill  . aspirin EC 81 MG tablet Take 81 mg by mouth every evening.    . cetirizine (ZYRTEC) 10 MG tablet Take 10 mg by mouth every evening.     . Cholecalciferol 4000 units CAPS Take 1 capsule by mouth every evening.    . cyclobenzaprine (FLEXERIL) 10 MG tablet Take 10 mg by mouth 3 (three) times daily as needed.    Marland Kitchen EPINEPHrine 0.3 mg/0.3 mL IJ SOAJ injection Inject 0.3 mg into the muscle once as needed.    . etodolac (LODINE XL) 400 MG 24 hr tablet Take 800 mg by mouth every evening.    . metFORMIN (GLUCOPHAGE-XR) 500 MG 24 hr tablet Take 1,000 mg by mouth every evening.    . Multiple Vitamin (MULTIVITAMIN WITH MINERALS) TABS Take 1 tablet by mouth daily.    . Olmesartan-Amlodipine-HCTZ (TRIBENZOR) 40-10-25 MG TABS Take 1 tablet by mouth every evening.    Marland Kitchen omeprazole (PRILOSEC) 20 MG capsule Take 20 mg by mouth daily.    . rosuvastatin (CRESTOR) 40 MG tablet Take 20 mg by mouth every evening.    . sitaGLIPtin (JANUVIA) 100 MG tablet Take 100 mg by mouth every evening.      Home: Home Living Family/patient expects to be discharged to:: Private residence Living Arrangements: Spouse/significant other Available Help at Discharge: Family, Available PRN/intermittently Type of Home: House Home Access: Stairs to enter [employer] of Steps: 4 down Entrance Stairs-Rails: None Home Layout: Two level, Able to live on main level with bedroom/bathroom Bathroom Toilet: Standard Home Equipment: None  Functional  History: Prior Function Level of Independence: Independent Comments: 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 Functional Status:  Mobility: Bed Mobility Overal bed mobility: Needs Assistance, +2 for physical assistance, + 2 for safety/equipment Bed Mobility: Rolling, Sidelying to Sit, Sit to Sidelying Rolling: Mod assist, +2 for physical assistance, +2 for safety/equipment Sidelying to sit: Max assist, +2 for physical assistance, +2 for safety/equipment Sit to sidelying: Max assist, +2 for physical assistance, +2 for safety/equipment General bed mobility comments: rolled to apply and remove brace, used log roll technique assisting with LEs and trunk to sit EOB, used pillow between knees to protect scrotum and assisted hips with bed pad Transfers Overall transfer level: Needs assistance Equipment used: Rolling walker (2 wheeled), 2 person hand held assist (PT supported L knee with her knee) Transfers: Sit to/from Stand Sit to Stand: Max assist, +2 physical assistance, +2 safety/equipment, From elevated surface General transfer comment: assist with pad under hips, L knee blocked, walker stabilized as pt pulled up, cues for NWB on R LE Ambulation/Gait General Gait Details: unable to attempt due to pain and NWB on RLE    ADL: ADL Overall ADL's : Needs assistance/impaired Eating/Feeding: Independent, Bed level Grooming: Wash/dry hands, Wash/dry face, Bed level, Set up Upper Body Bathing: Maximal assistance, Bed level Lower Body Bathing: Total assistance, Bed  level Upper Body Dressing : Maximal assistance, Bed level Lower Body Dressing: Total assistance, Bed level  Cognition: Cognition Overall Cognitive Status: Within Functional Limits for tasks assessed Orientation Level: Oriented X4 Cognition Arousal/Alertness: Awake/alert Behavior During Therapy: WFL for tasks assessed/performed Overall Cognitive Status: Within Functional Limits for tasks assessed  Blood pressure (!)  156/67, pulse (!) 116, temperature (!) 100.6 F (38.1 C), temperature source Oral, resp. rate 18, height 5\' 9"  (1.753 m), weight 112 kg (247 lb), SpO2 98 %. Physical Exam  Vitals reviewed. Constitutional: He is oriented to person, place, and time. He appears well-developed and well-nourished.  HENT:  Head: Normocephalic and atraumatic.  Eyes: Conjunctivae and EOM are normal.  Neck: Normal range of motion. Neck supple. No thyromegaly present.  Cardiovascular: Normal rate and regular rhythm.   Respiratory: Effort normal and breath sounds normal. No respiratory distress.  GI: Soft. Bowel sounds are normal. He exhibits no distension.  Musculoskeletal: He exhibits edema and tenderness.  Neurological: He is alert and oriented to person, place, and time.  Sensation intact to light touch Motor: B/l UE 5/5 proximal to distal B/l LE proximally >4/5 (pain inhibition), ankle dorsi/plantar flexion 5/5 DTRs symmetric  Skin: Skin is warm and dry.  Surgical site clean and dry with wound VAC  Psychiatric: His affect is blunt. He is agitated.   Results for orders placed or performed during the hospital encounter of 05/24/16 (from the past 24 hour(s))  Glucose, capillary     Status: Abnormal   Collection Time: 05/27/16  4:55 PM  Result Value Ref Range   Glucose-Capillary 213 (H) 65 - 99 mg/dL   Comment 1 Document in Chart   Glucose, capillary     Status: Abnormal   Collection Time: 05/27/16 11:58 PM  Result Value Ref Range   Glucose-Capillary 192 (H) 65 - 99 mg/dL  CBC     Status: Abnormal   Collection Time: 05/28/16  4:59 AM  Result Value Ref Range   WBC 7.8 4.0 - 10.5 K/uL   RBC 2.82 (L) 4.22 - 5.81 MIL/uL   Hemoglobin 8.2 (L) 13.0 - 17.0 g/dL   HCT 25.7 (L) 39.0 - 52.0 %   MCV 91.1 78.0 - 100.0 fL   MCH 29.1 26.0 - 34.0 pg   MCHC 31.9 30.0 - 36.0 g/dL   RDW 13.9 11.5 - 15.5 %   Platelets 133 (L) 150 - 400 K/uL  Glucose, capillary     Status: Abnormal   Collection Time: 05/28/16  6:12 AM    Result Value Ref Range   Glucose-Capillary 174 (H) 65 - 99 mg/dL  Glucose, capillary     Status: Abnormal   Collection Time: 05/28/16 11:57 AM  Result Value Ref Range   Glucose-Capillary 171 (H) 65 - 99 mg/dL   Dg Pelvis Comp Min 3v  Result Date: 05/27/2016 CLINICAL DATA:  Recent pubic symphysis diastasis EXAM: JUDET PELVIS - 3+ VIEW COMPARISON:  Pelvis radiograph May 24, 2016 FINDINGS: Neutral frontal, and left frontal, mallet frontal views obtained. There is screw and plate fixation through the superior pubic rami with resolution of pubic symphysis diastases. A screw transfixes the sacrum. There is no sacroiliac joint diastases appreciable on the submitted images. No acute fracture evident. There is slight symmetric narrowing of both hip joints. IMPRESSION: Postoperative fixation with no diastases appreciable currently. Slight symmetric narrowing of both hip joints. No acute fracture evident. Electronically Signed   By: Lowella Grip III M.D.   On: 05/27/2016 12:17   Dg  Pelvis Comp Min 3v  Result Date: 05/27/2016 CLINICAL DATA:  SI screw insertion And pelvic ring plate for ORIF, please note, screw insertion is on the PT RIGHT, images are sent to pacs, 48.2 seconds fluoro EXAM: JUDET PELVIS - 3+ VIEW; DG C-ARM 61-120 MIN COMPARISON:  CT, 05/24/2016 FINDINGS: Six submitted images show placement of a long screw crossing from the right ilium to the left ilium across both SI joints. Images also show placement of a fixation plate and screws reducing the distracted symphysis pubis. Orthopedic hardware is well-seated. No acute fracture or evidence of an operative complication. IMPRESSION: Well-positioned orthopedic hardware for ORIF of pelvic fractures. Electronically Signed   By: Lajean Manes M.D.   On: 05/27/2016 10:46   Dg C-arm 1-60 Min  Result Date: 05/27/2016 CLINICAL DATA:  SI screw insertion And pelvic ring plate for ORIF, please note, screw insertion is on the PT RIGHT, images are sent  to pacs, 48.2 seconds fluoro EXAM: JUDET PELVIS - 3+ VIEW; DG C-ARM 61-120 MIN COMPARISON:  CT, 05/24/2016 FINDINGS: Six submitted images show placement of a long screw crossing from the right ilium to the left ilium across both SI joints. Images also show placement of a fixation plate and screws reducing the distracted symphysis pubis. Orthopedic hardware is well-seated. No acute fracture or evidence of an operative complication. IMPRESSION: Well-positioned orthopedic hardware for ORIF of pelvic fractures. Electronically Signed   By: Lajean Manes M.D.   On: 05/27/2016 10:46    Assessment/Plan: Diagnosis: Multitrauma after fall with pelvic fracture, L3 endplate fracture and pelvic hemorrhage Labs and images independently reviewed.  Records reviewed and summated above.  1. Does the need for close, 24 hr/day medical supervision in concert with the patient's rehab needs make it unreasonable for this patient to be served in a less intensive setting? Yes 2. Co-Morbidities requiring supervision/potential complications: HTN (monitor and provide prns in accordance with increased physical exertion and pain), diabetes mellitus (Monitor in accordance with exercise and adjust meds as necessary), post-pain management (Biofeedback training with therapies to help reduce reliance on opiate pain medications, monitor pain control during therapies, and sedation at rest and titrate to maximum efficacy to ensure participation and gains in therapies), Acute blood loss anemia (transfuse if necessary to ensure appropriate perfusion for increased activity tolerance), SIRS (fevers, tachycardia - cont to monitor for signs and symptoms of infection, further workup if indicated), Thrombocytopenia (< 60,000/mm3 no resistive exercise) 3. Due to bladder management, safety, skin/wound care, pain management and patient education, does the patient require 24 hr/day rehab nursing? Yes 4. Does the patient require coordinated care of a  physician, rehab nurse, PT (1-2 hrs/day, 5 days/week) and OT (1-2 hrs/day, 5 days/week) to address physical and functional deficits in the context of the above medical diagnosis(es)? Yes Addressing deficits in the following areas: endurance, locomotion, transferring, bathing, dressing, toileting and psychological support 5. Can the patient actively participate in an intensive therapy program of at least 3 hrs of therapy per day at least 5 days per week? Likely in the near future 6. The potential for patient to make measurable gains while on inpatient rehab is excellent 7. Anticipated functional outcomes upon discharge from inpatient rehab are Mod I at wheelchair level  with PT, Mod I at wheelchair level with OT, n/a with SLP. 8. Estimated rehab length of stay to reach the above functional goals is: 12-17 days. 9. Does the patient have adequate social supports and living environment to accommodate these discharge functional goals? Yes  10. Anticipated D/C setting: Home 11. Anticipated post D/C treatments: HH therapy and Home excercise program 12. Overall Rehab/Functional Prognosis: excellent  RECOMMENDATIONS: This patient's condition is appropriate for continued rehabilitative care in the following setting: CIR when able to tolerate 3 hours therapy/day and medically stable. Patient has agreed to participate in recommended program. Yes Note that insurance prior authorization may be required for reimbursement for recommended care.  Comment: Rehab Admissions Coordinator to follow up.  Delice Lesch, MD 05/28/2016

## 2016-05-28 NOTE — Anesthesia Postprocedure Evaluation (Signed)
Anesthesia Post Note  Patient: Corey Sellers  Procedure(s) Performed: Procedure(s) (LRB): OPEN REDUCTION INTERNAL FIXATION (ORIF) SYMPHYSIS PUBIS W/ TRANSSACRAL SCREWS (N/A)  Patient location during evaluation: PACU Anesthesia Type: General Level of consciousness: awake and alert Pain management: pain level controlled Vital Signs Assessment: post-procedure vital signs reviewed and stable Respiratory status: spontaneous breathing, nonlabored ventilation, respiratory function stable and patient connected to nasal cannula oxygen Cardiovascular status: blood pressure returned to baseline and stable Postop Assessment: no signs of nausea or vomiting Anesthetic complications: no    Last Vitals:  Vitals:   05/28/16 1501 05/28/16 2016  BP:  131/70  Pulse: (!) 115 (!) 110  Resp:  18  Temp: (!) 38.5 C (!) 39.4 C    Last Pain:  Vitals:   05/28/16 2016  TempSrc: Oral  PainSc:                  Kiesha Ensey DAVID

## 2016-05-28 NOTE — Op Note (Signed)
Corey Sellers, Corey Sellers               ACCOUNT NO.:  [id number]  MEDICAL RECORD NO.:  [id number]  LOCATION:  5N01C                        FACILITY:  [facility]  PHYSICIAN:  Astrid Divine. Marcelino Scot, M.D. DATE OF BIRTH:  1950/10/24  DATE OF PROCEDURE:  05/27/2016 DATE OF DISCHARGE:                              OPERATIVE REPORT   PREOPERATIVE DIAGNOSIS:  Unstable pelvic ring, anterior and posterior.  POSTOPERATIVE DIAGNOSIS:  Unstable pelvic ring, anterior and posterior.  PROCEDURE: 1. ORIF of anterior pubic symphysis. 2. Transsacral screw fixation, right and left. 3. Application of small wound VAC.  SURGEON:  Astrid Divine. Marcelino Scot, M.D.  ASSISTANT:  Ainsley Spinner, PA-C.  ANESTHESIA:  General.  COMPLICATIONS:  [id number] mL crystalloid/UOP 1300 mL, EBL 110 mL.  DISPOSITION:  PACU.  CONDITION:  Stable.  BRIEF SUMMARY AND INDICATION FOR PROCEDURE:  Corey Sellers is a very pleasant 65 year old male involved in a fall from a ladder during which he sustained an unstable pelvic ring with large diastasis of his symphysis pubis associated with significant bleeding that required embolization through an angiography.  He also had displacement of the posterior pelvis that extended all the way to the vertebral body on the right and consequently required transsacral screw fixation across both the right and the left hemipelvis.  The patient initially seen and evaluated by the surgeon who asserted that this was outside the scope of practice and required the expertise of a fellowship trained with orthopedic traumatologist.  Consequently, I was consulted to see the patient in consultation and administered further management.  I discussed with him the risks and benefits of surgery including, potential for nerve injury, vessel injury, bladder injury, DVT, PE, infection, malunion, nonunion, need for further surgery among others. 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  The patient acknowledged these risks and strongly wished to proceed.  BRIEF SUMMARY OF PROCEDURE:  The patient was taken to the operating room where general anesthesia was induced.  He was positioned supine with all prominences padded appropriately.  A C-arm was brought in to make sure we could obtain a perfect lateral.  A standard prep and drape was performed using chlorhexidine scrub and Betadine scrub and paint.  The standard Pfannenstiel incision was made just over the pelvic brim. Dissection carried carefully down watching out for the spermatic cord. Using electrocautery we controlled the epigastric.  Pubis had a wide diastasis with an extremely large amount of hematoma, much of this was removed initially and then irrigated and removed some more.  Cleaned off the pelvic brim.  Reduction was obtained with a clamp placed anteriorly, but this was insufficient.  Given his large body habitus to completely control, I generate force on the clamp.  Consequently plate was used as an adjunct.  It was secured with a screw initially on the right side and then in serial fashion eccentric screws were placed to further increase the compression.  Screws were also placed in compression mode on the contralateral side and this completely reduced the fracture and gave [place] 6 cortices of purchase on both sides of the symphysis.  The wound was irrigated thoroughly.  Additional hematoma was then removed from the relying the bladder and underneath the rectus.  I did note some slight pinkening of the urine at that point, after the final removal of hematoma from this area, we did not encounter the bladder.  Directly I did use the malleable retractor throughout to protect this and my assistant Ainsley Spinner again provided both deep retraction and protection of the bladder.  A standard layered closure was performed with a figure- of-eight #1 Vicryl, and then deep #1 Vicryl, 0  Vicryl, 2-0 Vicryl, and nylon for the skin.  A wound VAC was applied to assist with the removal of this serous drainage.  The C-arm was used to obtain a perfect lateral of S1, starting point identified, the guide pin advanced to the ilium across the SI joint on the right and the sacral ala, watching out for the L5 nerve root.  I then placed the pin across the left sacral ala, watching again for the nerve there, checking inlet and outlet to make sure we were clear of the foramina and ala and securing it on the far side of the SI joint.  It was drilled and then a 150 mm screw was placed with washer.  Excellent bite was obtained.  My assistant, helped to push on the pelvis during placement of the screw.  We did securely tighten the screw and we were careful not to do so excessively.  Final images showed appropriate placement and trajectory and length of all hardware.  Ainsley Spinner, PA-C assisted throughout.  PROGNOSIS:  The patient will be nonweightbearing the right lower extremity.  Weightbearing as tolerated on the left for the next 8 weeks and gradually weightbearing from 8-12 weeks.  He will be on formal pharmacologic DVT prophylaxis.  Continued to be monitored by the Trauma Service for primary management.  Anticipate another 3 day stay here possibly rehab and then we will see him back in the office for removal of sutures in 14 days.  He is at increased risk of further SI joint symptoms and hopefully they go on to consolidation avoiding the need for a more formal fusion measures.     Astrid Divine. Marcelino Scot, M.D.     MHH/MEDQ  D:  05/27/2016  T:  05/28/2016  Job:  [id number]

## 2016-05-29 ENCOUNTER — Inpatient Hospital Stay (HOSPITAL_COMMUNITY): Payer: BLUE CROSS/BLUE SHIELD

## 2016-05-29 LAB — URINALYSIS, ROUTINE W REFLEX MICROSCOPIC
BILIRUBIN URINE: NEGATIVE
GLUCOSE, UA: NEGATIVE mg/dL
HGB URINE DIPSTICK: NEGATIVE
Ketones, ur: NEGATIVE mg/dL
Leukocytes, UA: NEGATIVE
Nitrite: NEGATIVE
PH: 5.5 (ref 5.0–8.0)
Protein, ur: NEGATIVE mg/dL
SPECIFIC GRAVITY, URINE: 1.014 (ref 1.005–1.030)

## 2016-05-29 LAB — GLUCOSE, CAPILLARY
GLUCOSE-CAPILLARY: 160 mg/dL — AB (ref 65–99)
Glucose-Capillary: 161 mg/dL — ABNORMAL HIGH (ref 65–99)
Glucose-Capillary: 191 mg/dL — ABNORMAL HIGH (ref 65–99)
Glucose-Capillary: 169 mg/dL — ABNORMAL HIGH (ref 65–99)

## 2016-05-29 LAB — CBC
HCT: 25 % — ABNORMAL LOW (ref 39.0–52.0)
Hemoglobin: 8 g/dL — ABNORMAL LOW (ref 13.0–17.0)
MCH: 29 pg (ref 26.0–34.0)
MCHC: 32 g/dL (ref 30.0–36.0)
MCV: 90.6 fL (ref 78.0–100.0)
PLATELETS: 148 10*3/uL — AB (ref 150–400)
RBC: 2.76 MIL/uL — ABNORMAL LOW (ref 4.22–5.81)
RDW: 13.8 % (ref 11.5–15.5)
WBC: 7.2 10*3/uL (ref 4.0–10.5)

## 2016-05-29 NOTE — Progress Notes (Signed)
Physical Therapy Treatment Patient Details Name: Corey Sellers MRN: [id number] DOB: [birth year] Today's Date: 05/29/2016    History of Present Illness pt admitted after falling from scaffolding while building a [employer], resulting in pelvic fx, L3 end plate fx and pelvic hemorrhage. s/p symphysis pubis, transsacral screw and application of wound vac. Pt with significant scrotal swelling.    PT Comments    Pt presented supine in bed with HOB elevated, awake and willing to participate in therapy session. Pt making some progress towards achieving his functional goals. However, pt continues to present with severe pain which limited him throughout today's session. Pt able to sit EOB for approximately 10 minutes with min guard and TLSO donned. Pt performed STS x2 with mod A x2 and use of RW. Pt maintained NWB R LE throughout. PT demonstrated use of Stedy to assist pt with transfer to recliner; however, pt refusing to transfer at this time secondary to pain. PT stressed the importance of getting OOB for his recovery; however, pt refused and stated that he would be willing to attempt the transfer another day. Pt would continue to benefit from skilled physical therapy services at this time while admitted and after d/c to address his limitations in order to improve his overall safety and independence with functional mobility. Pt continues to be an excellent candidate for CIR following d/c.   Follow Up Recommendations  CIR     Equipment Recommendations  None recommended by PT;Other (comment) (defer to next venue)    Recommendations for Other Services Rehab consult     Precautions / Restrictions Precautions Precautions: Back;Fall;Other (comment) Precaution Booklet Issued: No Precaution Comments: instructed in log roll technique, care taken with scrotum with mobility Required Braces or Orthoses: Spinal Brace Spinal Brace: Thoracolumbosacral orthotic;Applied in supine  position Restrictions Weight Bearing Restrictions: Yes RLE Weight Bearing: Non weight bearing LLE Weight Bearing: Weight bearing as tolerated Other Position/Activity Restrictions: Pt could maintain NWB on RLE with 2 person assist    Mobility  Bed Mobility Overal bed mobility: Needs Assistance;+2 for physical assistance;+ 2 for safety/equipment Bed Mobility: Rolling;Sidelying to Sit;Sit to Sidelying Rolling: Mod assist;+2 for physical assistance;+2 for safety/equipment Sidelying to sit: Mod assist;+2 for physical assistance;+2 for safety/equipment     Sit to sidelying: Mod assist;+2 for physical assistance;+2 for safety/equipment General bed mobility comments: rolled to apply and remove brace, used log roll technique assisting with LEs and trunk to sit EOB, used pillow between knees to protect scrotum and assisted hips with bed pad  Transfers Overall transfer level: Needs assistance Equipment used: Rolling walker (2 wheeled) Transfers: Sit to/from Stand Sit to Stand: Mod assist;+2 physical assistance;+2 safety/equipment         General transfer comment: pt required increased time and mod A to achieve full standing position; pt able to maintain NWB R LE status throughout  Ambulation/Gait             General Gait Details: pt attempted to advance L LE, however, unable to do so secondary to pain and pt reporting the L LE was too weak to move   Stairs            Wheelchair Mobility    Modified Rankin (Stroke Patients Only)       Balance Overall balance assessment: Needs assistance Sitting-balance support: Feet supported;Bilateral upper extremity supported Sitting balance-Leahy Scale: Poor Sitting balance - Comments: pt able to sustain upright sitting with bilateral UE supports and min guard   Standing balance support:  During functional activity;Bilateral upper extremity supported Standing balance-Leahy Scale: Poor Standing balance comment: pt able to stand with  NWB R LE using RW and mod A x2                    Cognition Arousal/Alertness: Awake/alert Behavior During Therapy: WFL for tasks assessed/performed Overall Cognitive Status: Within Functional Limits for tasks assessed                      Exercises      General Comments        Pertinent Vitals/Pain Pain Assessment: Faces Faces Pain Scale: Hurts whole lot Pain Location: back Pain Descriptors / Indicators: Grimacing;Guarding;Moaning Pain Intervention(s): Monitored during session;Repositioned    Home Living                      Prior Function            PT Goals (current goals can now be found in the care plan section) Acute Rehab PT Goals Patient Stated Goal: decrease pain PT Goal Formulation: With patient Time For Goal Achievement: 06/11/16 Potential to Achieve Goals: Good Progress towards PT goals: Progressing toward goals    Frequency  Min 3X/week    PT Plan Current plan remains appropriate    Co-evaluation             End of Session Equipment Utilized During Treatment: Gait belt;Other (comment) (TLSO) Activity Tolerance: Patient limited by pain Patient left: in bed;with call bell/phone within reach     Time: 1202-1239 PT Time Calculation (min) (ACUTE ONLY): 37 min  Charges:  $Therapeutic Activity: 23-37 mins                    G CodesClearnce Sorrel Haiven [place] May 30, 2016, 1:21 PM Sherie Don, [place], DPT [phone]

## 2016-05-29 NOTE — Progress Notes (Signed)
Orthopaedic Trauma Service Progress Note  Subjective  Doing well Sat up yesterday with therapy  Doesn't want foley out   ROS As above  Objective   BP 134/67 (BP Location: Left Arm)   Pulse (!) 107   Temp 100.2 F (37.9 C) (Oral)   Resp 18   Ht 5\' 9"  (1.753 m)   Wt 112 kg (247 lb)   SpO2 96%   BMI 36.48 kg/m   Intake/Output      08/30 0701 - 08/31 0700 08/31 0701 - 09/01 0700   P.O. 720 480   I.V. (mL/kg)     IV Piggyback     Total Intake(mL/kg) 720 (6.4) 480 (4.3)   Urine (mL/kg/hr) 4250 (1.6)    Drains 0 (0)    Blood     Total Output 4250     Net -3530 +480          Labs  Results for KALOB, SAKAI (MRN [id number]) as of 05/29/2016 10:06  Ref. Range 05/29/2016 03:43  WBC Latest Ref Range: 4.0 - 10.5 K/uL 7.2  RBC Latest Ref Range: 4.22 - 5.81 MIL/uL 2.76 (L)  Hemoglobin Latest Ref Range: 13.0 - 17.0 g/dL 8.0 (L)  HCT Latest Ref Range: 39.0 - 52.0 % 25.0 (L)  MCV Latest Ref Range: 78.0 - 100.0 fL 90.6  MCH Latest Ref Range: 26.0 - 34.0 pg 29.0  MCHC Latest Ref Range: 30.0 - 36.0 g/dL 32.0  RDW Latest Ref Range: 11.5 - 15.5 % 13.8  Platelets Latest Ref Range: 150 - 400 K/uL 148 (L)   CBG (last 3)   Recent Labs  05/28/16 1635 05/28/16 2140 05/29/16 0649  GLUCAP 132* 159* 160*      Exam  Gen: awake and alert, resting comfortably in bed, NAD Abd: + BS, NT Pelvis: incision vac dressing intact. No drainage appreciated in VAC canister                        scrotal swelling improved  Ext:       B Lower Extremities                       DPN, SPN, TN sensation intact B                       EHL intact B                       FHL, AT, PT, peroneals, gastroc motor intact B                        exts are warm                       + DP pulses B                        No significant swelling     Assessment and Plan   POD/HD#: 54 65 year old white male status post fall off of scaffolding with complex pelvic ring injury, status post IR  embolization of left internal iliac artery   -APC 2 pelvic ring injury s/p ORIF                        NWB R lower extremity  WBAT Left leg for transfers only                        ROM as tolerated                        Ice PRN                        PT and OT                        - Pain management:                       Continue per trauma service   - ABL anemia/Hemodynamics                       CBC istable             Recheck in am      - Medical issues                       Diabetes                           Sliding scale                            Home meds      Tight sugar control to decrease risk of infection and nonunion    - DVT/PE prophylaxis:                       SCDs and Lovenox                     start coumadin once H/H stabilizes- likely tomorrow    - ID:                        Perioperative Ancef completed      - Activity                     as above         Continue therapy          Get to chair today    - FEN/GI prophylaxis/Foley/Lines:                       advance to CHO mod diet                        continue with foley. Pt with urinary retention                                 meds started                                 Voiding trial possibly today    - Dispo:                        therapies              CIR in  next day or 2 if approved    Jari Pigg, PA-C [facility] [phone] (O) 05/29/2016 10:05 AM

## 2016-05-29 NOTE — Progress Notes (Signed)
At 1800 assessed to see if pt was able to void since removal of foley at 1200. Pt stated he felt like he needed to have a BM and the pressure was preventing him from being able to void. He requested to be I&O catheterized to relieve his bladder and hopefully try again later. At 1824 removed 700 mL of clear, yellow urine and pt stated he felt more comfortable. Will pass along to night RN and continue to monitor

## 2016-05-29 NOTE — Progress Notes (Signed)
Patient has T. Of 102.9, MD was paged, new order received for Tylenol 1,000 mg PO q6hrs prn for temp. Greater than 101. Patient was medicated with Tylenol at 2100 and encouraged patient to use incentive spirometer 1x every hour, he verbalized understanding with return demonstration Temperature rechecked now 100.9, will continue to monitor.

## 2016-05-29 NOTE — Progress Notes (Signed)
Rehab admissions - I met with patient at the bedside.  I will open the case with [employer] and request acute inpatient rehab admission.  I will follow up tomorrow after I hear back from insurance carrier.  Call me for questions.  #[phone]

## 2016-05-29 NOTE — Progress Notes (Signed)
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 He was seen by a pulmonologist (Dr. Marijo Sanes) and tested for AIDs which was negative. He believes this may be the source of his fever and states he would like to talk to the doctor about it tomorrow monring. Will pass information along to night RN. Will continue to monitor

## 2016-05-29 NOTE — Plan of Care (Signed)
Problem: Safety: Goal: Ability to remain free from injury will improve Outcome: Progressing No fall or injury noted this shift  Problem: Skin Integrity: Goal: Risk for impaired skin integrity will decrease Outcome: Progressing pelvis with bruised areas

## 2016-05-29 NOTE — Progress Notes (Signed)
Patient ID: Corey Sellers, male   DOB: April 05, 1951, 65 y.o.   MRN: [id number]   LOS: 5 days   Subjective: Doing ok, pain controlled, denies N/V.   Objective: Vital signs in last 24 hours: Temp:  [99.5 F (37.5 C)-102.9 F (39.4 C)] 100.2 F (37.9 C) (08/31 0631) Pulse Rate:  [107-116] 107 (08/31 0631) Resp:  [18] 18 (08/31 0631) BP: (131-156)/(67-70) 134/67 (08/31 0631) SpO2:  [93 %-100 %] 96 % (08/31 0631) Last BM Date: 05/26/16   Laboratory  CBC  Recent Labs  05/28/16 0459 05/29/16 0343  WBC 7.8 7.2  HGB 8.2* 8.0*  HCT 25.7* 25.0*  PLT 133* 148*   CBG (last 3)   Recent Labs  05/28/16 1635 05/28/16 2140 05/29/16 0649  GLUCAP 132* 159* 160*    Physical Exam General appearance: alert and no distress Resp: clear to auscultation bilaterally Cardio: Tachycardia GI: normal findings: bowel sounds normal and soft, non-tender Extremities: NVI   Assessment/Plan: Fall L3 endplate FX - LSO per Dr. Ronnald Ramp Open book pelvic FX with sacral FX s/p ORIF- per Dr.Handy, WBAT on LLE for transfers only, NWB RLE S/P angioembolization pelvic hemorrhage ABL anemia- Hgb stable Urinary retention -- Flomax and urecholine, voiding trial today Multiple medical problems-- Home meds FEN- Advance to carb mod VTE- SCD's, Lovenox DIspo- CIR when bed available    Lisette Abu, PA-C Pager: [phone] General Trauma PA Pager: [phone]  05/29/2016

## 2016-05-29 NOTE — Plan of Care (Signed)
Problem: Safety: Goal: Ability to remain free from injury will improve Outcome: Progressing No fall or injury noted

## 2016-05-30 ENCOUNTER — Inpatient Hospital Stay (HOSPITAL_COMMUNITY)
Admission: RE | Admit: 2016-05-30 | Discharge: 2016-06-13 | DRG: 560 | Disposition: A | Discharge status: Home Health | Payer: BLUE CROSS/BLUE SHIELD | Source: Intra-hospital | Attending: Physical Medicine & Rehabilitation | Admitting: Physical Medicine & Rehabilitation

## 2016-05-30 ENCOUNTER — Encounter (HOSPITAL_COMMUNITY): Payer: Self-pay | Admitting: Physical Medicine and Rehabilitation

## 2016-05-30 DIAGNOSIS — S32810S Multiple fractures of pelvis with stable disruption of pelvic ring, sequela: Secondary | ICD-10-CM

## 2016-05-30 DIAGNOSIS — Z7984 Long term (current) use of oral hypoglycemic drugs: Secondary | ICD-10-CM | POA: Diagnosis not present

## 2016-05-30 DIAGNOSIS — Z85820 Personal history of malignant melanoma of skin: Secondary | ICD-10-CM | POA: Diagnosis not present

## 2016-05-30 DIAGNOSIS — S32009A Unspecified fracture of unspecified lumbar vertebra, initial encounter for closed fracture: Secondary | ICD-10-CM

## 2016-05-30 DIAGNOSIS — Z6835 Body mass index (BMI) 35.0-35.9, adult: Secondary | ICD-10-CM

## 2016-05-30 DIAGNOSIS — T1490 Injury, unspecified: Secondary | ICD-10-CM

## 2016-05-30 DIAGNOSIS — R339 Retention of urine, unspecified: Secondary | ICD-10-CM | POA: Diagnosis present

## 2016-05-30 DIAGNOSIS — E1165 Type 2 diabetes mellitus with hyperglycemia: Secondary | ICD-10-CM | POA: Diagnosis not present

## 2016-05-30 DIAGNOSIS — N319 Neuromuscular dysfunction of bladder, unspecified: Secondary | ICD-10-CM

## 2016-05-30 DIAGNOSIS — K219 Gastro-esophageal reflux disease without esophagitis: Secondary | ICD-10-CM

## 2016-05-30 DIAGNOSIS — D62 Acute posthemorrhagic anemia: Secondary | ICD-10-CM | POA: Diagnosis present

## 2016-05-30 DIAGNOSIS — E871 Hypo-osmolality and hyponatremia: Secondary | ICD-10-CM | POA: Diagnosis not present

## 2016-05-30 DIAGNOSIS — Z7982 Long term (current) use of aspirin: Secondary | ICD-10-CM

## 2016-05-30 DIAGNOSIS — Z96611 Presence of right artificial shoulder joint: Secondary | ICD-10-CM

## 2016-05-30 DIAGNOSIS — K5900 Constipation, unspecified: Secondary | ICD-10-CM | POA: Diagnosis not present

## 2016-05-30 DIAGNOSIS — S32039D Unspecified fracture of third lumbar vertebra, subsequent encounter for fracture with routine healing: Secondary | ICD-10-CM | POA: Diagnosis not present

## 2016-05-30 DIAGNOSIS — S32038S Other fracture of third lumbar vertebra, sequela: Secondary | ICD-10-CM | POA: Diagnosis not present

## 2016-05-30 DIAGNOSIS — R509 Fever, unspecified: Secondary | ICD-10-CM

## 2016-05-30 DIAGNOSIS — G8918 Other acute postprocedural pain: Secondary | ICD-10-CM | POA: Diagnosis not present

## 2016-05-30 DIAGNOSIS — R5082 Postprocedural fever: Secondary | ICD-10-CM

## 2016-05-30 DIAGNOSIS — IMO0002 Reserved for concepts with insufficient information to code with codable children: Secondary | ICD-10-CM

## 2016-05-30 DIAGNOSIS — S3282XA Multiple fractures of pelvis without disruption of pelvic ring, initial encounter for closed fracture: Secondary | ICD-10-CM | POA: Diagnosis present

## 2016-05-30 DIAGNOSIS — W12XXXD Fall on and from scaffolding, subsequent encounter: Secondary | ICD-10-CM

## 2016-05-30 DIAGNOSIS — I10 Essential (primary) hypertension: Secondary | ICD-10-CM

## 2016-05-30 DIAGNOSIS — N5089 Other specified disorders of the male genital organs: Secondary | ICD-10-CM | POA: Diagnosis not present

## 2016-05-30 DIAGNOSIS — K5901 Slow transit constipation: Secondary | ICD-10-CM | POA: Diagnosis not present

## 2016-05-30 DIAGNOSIS — Z87891 Personal history of nicotine dependence: Secondary | ICD-10-CM

## 2016-05-30 DIAGNOSIS — K567 Ileus, unspecified: Secondary | ICD-10-CM | POA: Diagnosis not present

## 2016-05-30 DIAGNOSIS — S3282XS Multiple fractures of pelvis without disruption of pelvic ring, sequela: Secondary | ICD-10-CM | POA: Diagnosis not present

## 2016-05-30 DIAGNOSIS — E119 Type 2 diabetes mellitus without complications: Secondary | ICD-10-CM | POA: Diagnosis not present

## 2016-05-30 DIAGNOSIS — S329XXD Fracture of unspecified parts of lumbosacral spine and pelvis, subsequent encounter for fracture with routine healing: Secondary | ICD-10-CM | POA: Diagnosis present

## 2016-05-30 DIAGNOSIS — M79609 Pain in unspecified limb: Secondary | ICD-10-CM | POA: Diagnosis not present

## 2016-05-30 DIAGNOSIS — Z8589 Personal history of malignant neoplasm of other organs and systems: Secondary | ICD-10-CM | POA: Diagnosis not present

## 2016-05-30 DIAGNOSIS — S32000A Wedge compression fracture of unspecified lumbar vertebra, initial encounter for closed fracture: Secondary | ICD-10-CM

## 2016-05-30 DIAGNOSIS — Z79899 Other long term (current) drug therapy: Secondary | ICD-10-CM

## 2016-05-30 DIAGNOSIS — Z85828 Personal history of other malignant neoplasm of skin: Secondary | ICD-10-CM | POA: Diagnosis not present

## 2016-05-30 DIAGNOSIS — R609 Edema, unspecified: Secondary | ICD-10-CM | POA: Diagnosis not present

## 2016-05-30 DIAGNOSIS — S3210XD Unspecified fracture of sacrum, subsequent encounter for fracture with routine healing: Secondary | ICD-10-CM

## 2016-05-30 DIAGNOSIS — T1490XA Injury, unspecified, initial encounter: Secondary | ICD-10-CM

## 2016-05-30 DIAGNOSIS — S32039A Unspecified fracture of third lumbar vertebra, initial encounter for closed fracture: Secondary | ICD-10-CM | POA: Diagnosis present

## 2016-05-30 LAB — CBC
HEMATOCRIT: 24.6 % — AB (ref 39.0–52.0)
HEMOGLOBIN: 7.8 g/dL — AB (ref 13.0–17.0)
MCH: 28.6 pg (ref 26.0–34.0)
MCHC: 31.7 g/dL (ref 30.0–36.0)
MCV: 90.1 fL (ref 78.0–100.0)
Platelets: 180 10*3/uL (ref 150–400)
RBC: 2.73 MIL/uL — AB (ref 4.22–5.81)
RDW: 13.5 % (ref 11.5–15.5)
WBC: 6.9 10*3/uL (ref 4.0–10.5)

## 2016-05-30 LAB — GLUCOSE, CAPILLARY
GLUCOSE-CAPILLARY: 164 mg/dL — AB (ref 65–99)
GLUCOSE-CAPILLARY: 181 mg/dL — AB (ref 65–99)
GLUCOSE-CAPILLARY: 145 mg/dL — AB (ref 65–99)
GLUCOSE-CAPILLARY: 170 mg/dL — AB (ref 65–99)

## 2016-05-30 LAB — URINALYSIS, ROUTINE W REFLEX MICROSCOPIC
Bilirubin Urine: NEGATIVE
Glucose, UA: 100 mg/dL — AB
Hgb urine dipstick: NEGATIVE
KETONES UR: NEGATIVE mg/dL
LEUKOCYTES UA: NEGATIVE
Nitrite: NEGATIVE
PROTEIN: NEGATIVE mg/dL
Specific Gravity, Urine: 1.02 (ref 1.005–1.030)
pH: 6.5 (ref 5.0–8.0)

## 2016-05-30 MED ORDER — GUAIFENESIN-DM 100-10 MG/5ML PO SYRP: 5.0000 mL | ORAL_SOLUTION | Freq: Four times a day (QID) | ORAL | Status: DC | PRN

## 2016-05-30 MED ORDER — METFORMIN HCL ER 500 MG PO TB24
1000.0000 mg | ORAL_TABLET | Freq: Every day | ORAL | Status: DC
  Administered 2016-05-30 – 2016-06-12 (×14): 1000 mg via ORAL
  Filled 2016-05-30 (×14): qty 2

## 2016-05-30 MED ORDER — CYCLOBENZAPRINE HCL 10 MG PO TABS
10.0000 mg | ORAL_TABLET | Freq: Three times a day (TID) | ORAL | Status: DC | PRN
  Administered 2016-05-30 – 2016-06-11 (×17): 10 mg via ORAL
  Filled 2016-05-30 (×17): qty 1

## 2016-05-30 MED ORDER — PROCHLORPERAZINE MALEATE 5 MG PO TABS: 5.0000 mg | ORAL_TABLET | Freq: Four times a day (QID) | ORAL | Status: DC | PRN

## 2016-05-30 MED ORDER — MORPHINE SULFATE (PF) 2 MG/ML IV SOLN: 2.0000 mg | INTRAVENOUS | Status: DC | PRN

## 2016-05-30 MED ORDER — IRBESARTAN 300 MG PO TABS
300.0000 mg | ORAL_TABLET | Freq: Every day | ORAL | Status: DC
  Administered 2016-05-31 – 2016-06-13 (×14): 300 mg via ORAL
  Filled 2016-05-30 (×14): qty 1

## 2016-05-30 MED ORDER — PROCHLORPERAZINE EDISYLATE 5 MG/ML IJ SOLN: 5.0000 mg | Freq: Four times a day (QID) | INTRAMUSCULAR | Status: DC | PRN

## 2016-05-30 MED ORDER — AMLODIPINE BESYLATE 10 MG PO TABS
10.0000 mg | ORAL_TABLET | Freq: Every day | ORAL | Status: DC
  Administered 2016-05-31 – 2016-06-13 (×14): 10 mg via ORAL
  Filled 2016-05-30 (×14): qty 1

## 2016-05-30 MED ORDER — DIPHENHYDRAMINE HCL 12.5 MG/5ML PO ELIX: 12.5000 mg | ORAL_SOLUTION | Freq: Four times a day (QID) | ORAL | Status: DC | PRN

## 2016-05-30 MED ORDER — HYDROCHLOROTHIAZIDE 25 MG PO TABS
25.0000 mg | ORAL_TABLET | Freq: Every day | ORAL | Status: DC
  Administered 2016-05-31 – 2016-06-13 (×14): 25 mg via ORAL
  Filled 2016-05-30 (×14): qty 1

## 2016-05-30 MED ORDER — BETHANECHOL CHLORIDE 25 MG PO TABS
25.0000 mg | ORAL_TABLET | Freq: Four times a day (QID) | ORAL | Status: DC
  Administered 2016-05-30 – 2016-05-31 (×2): 25 mg via ORAL
  Filled 2016-05-30 (×2): qty 1

## 2016-05-30 MED ORDER — BISACODYL 10 MG RE SUPP
10.0000 mg | Freq: Every day | RECTAL | Status: DC | PRN
  Administered 2016-05-31 – 2016-06-03 (×3): 10 mg via RECTAL
  Filled 2016-05-30 (×3): qty 1

## 2016-05-30 MED ORDER — ALUM & MAG HYDROXIDE-SIMETH 200-200-20 MG/5ML PO SUSP
30.0000 mL | ORAL | Status: DC | PRN
  Administered 2016-06-03 – 2016-06-12 (×9): 30 mL via ORAL
  Filled 2016-05-30 (×9): qty 30

## 2016-05-30 MED ORDER — ENOXAPARIN SODIUM 40 MG/0.4ML ~~LOC~~ SOLN
40.0000 mg | SUBCUTANEOUS | Status: DC
  Administered 2016-05-30 – 2016-06-12 (×14): 40 mg via SUBCUTANEOUS
  Filled 2016-05-30 (×14): qty 0.4

## 2016-05-30 MED ORDER — POLYETHYLENE GLYCOL 3350 17 G PO PACK
17.0000 g | PACK | Freq: Two times a day (BID) | ORAL | Status: DC
  Administered 2016-05-30 – 2016-06-10 (×16): 17 g via ORAL
  Filled 2016-05-30 (×26): qty 1

## 2016-05-30 MED ORDER — LINAGLIPTIN 5 MG PO TABS
5.0000 mg | ORAL_TABLET | Freq: Every day | ORAL | Status: DC
  Administered 2016-05-31 – 2016-06-13 (×14): 5 mg via ORAL
  Filled 2016-05-30 (×14): qty 1

## 2016-05-30 MED ORDER — ASPIRIN EC 81 MG PO TBEC
81.0000 mg | DELAYED_RELEASE_TABLET | Freq: Every evening | ORAL | Status: DC
  Administered 2016-05-30 – 2016-06-12 (×14): 81 mg via ORAL
  Filled 2016-05-30 (×14): qty 1

## 2016-05-30 MED ORDER — ROSUVASTATIN CALCIUM 10 MG PO TABS
20.0000 mg | ORAL_TABLET | Freq: Every evening | ORAL | Status: DC
  Administered 2016-05-30 – 2016-06-12 (×14): 20 mg via ORAL
  Filled 2016-05-30 (×14): qty 2

## 2016-05-30 MED ORDER — FLEET ENEMA 7-19 GM/118ML RE ENEM: 1.0000 | ENEMA | Freq: Once | RECTAL | Status: DC | PRN

## 2016-05-30 MED ORDER — LORATADINE 10 MG PO TABS
10.0000 mg | ORAL_TABLET | Freq: Every day | ORAL | Status: DC
  Administered 2016-05-31 – 2016-06-13 (×14): 10 mg via ORAL
  Filled 2016-05-30 (×14): qty 1

## 2016-05-30 MED ORDER — ACETAMINOPHEN 325 MG PO TABS
325.0000 mg | ORAL_TABLET | ORAL | Status: DC | PRN
  Filled 2016-05-30: qty 2

## 2016-05-30 MED ORDER — INSULIN ASPART 100 UNIT/ML ~~LOC~~ SOLN
0.0000 [IU] | Freq: Every day | SUBCUTANEOUS | Status: DC
  Administered 2016-06-11: 2 [IU] via SUBCUTANEOUS

## 2016-05-30 MED ORDER — LIDOCAINE HCL 2 % EX GEL
CUTANEOUS | Status: DC | PRN
  Filled 2016-05-30: qty 5

## 2016-05-30 MED ORDER — PHENOL 1.4 % MT LIQD: 1.0000 | OROMUCOSAL | Status: DC | PRN

## 2016-05-30 MED ORDER — POLYETHYLENE GLYCOL 3350 17 G PO PACK: 17.0000 g | PACK | Freq: Every day | ORAL | Status: DC

## 2016-05-30 MED ORDER — TRAMADOL HCL 50 MG PO TABS
50.0000 mg | ORAL_TABLET | Freq: Four times a day (QID) | ORAL | Status: DC | PRN
  Administered 2016-05-30 – 2016-06-11 (×17): 50 mg via ORAL
  Filled 2016-05-30 (×18): qty 1

## 2016-05-30 MED ORDER — TAMSULOSIN HCL 0.4 MG PO CAPS
0.8000 mg | ORAL_CAPSULE | Freq: Every day | ORAL | Status: DC
  Administered 2016-05-31 – 2016-06-13 (×14): 0.8 mg via ORAL
  Filled 2016-05-30 (×14): qty 2

## 2016-05-30 MED ORDER — VITAMIN D3 25 MCG (1000 UNIT) PO TABS
4000.0000 [IU] | ORAL_TABLET | Freq: Every evening | ORAL | Status: DC
  Administered 2016-05-30 – 2016-06-12 (×14): 4000 [IU] via ORAL
  Filled 2016-05-30 (×28): qty 4

## 2016-05-30 MED ORDER — ONDANSETRON HCL 4 MG/2ML IJ SOLN: 4.0000 mg | Freq: Four times a day (QID) | INTRAMUSCULAR | Status: DC | PRN

## 2016-05-30 MED ORDER — INSULIN ASPART 100 UNIT/ML ~~LOC~~ SOLN
0.0000 [IU] | Freq: Three times a day (TID) | SUBCUTANEOUS | Status: DC
  Administered 2016-05-31: 3 [IU] via SUBCUTANEOUS
  Administered 2016-05-31: 2 [IU] via SUBCUTANEOUS
  Administered 2016-05-31 – 2016-06-01 (×4): 3 [IU] via SUBCUTANEOUS
  Administered 2016-06-02: 2 [IU] via SUBCUTANEOUS
  Administered 2016-06-02 – 2016-06-03 (×4): 3 [IU] via SUBCUTANEOUS
  Administered 2016-06-03 – 2016-06-04 (×2): 2 [IU] via SUBCUTANEOUS
  Administered 2016-06-04: 3 [IU] via SUBCUTANEOUS
  Administered 2016-06-04 – 2016-06-05 (×3): 2 [IU] via SUBCUTANEOUS
  Administered 2016-06-06 (×2): 3 [IU] via SUBCUTANEOUS
  Administered 2016-06-06 – 2016-06-07 (×2): 2 [IU] via SUBCUTANEOUS
  Administered 2016-06-07 – 2016-06-08 (×3): 3 [IU] via SUBCUTANEOUS
  Administered 2016-06-09: 2 [IU] via SUBCUTANEOUS
  Administered 2016-06-09: 3 [IU] via SUBCUTANEOUS
  Administered 2016-06-10 (×2): 2 [IU] via SUBCUTANEOUS
  Administered 2016-06-11 (×2): 3 [IU] via SUBCUTANEOUS
  Administered 2016-06-12 (×2): 2 [IU] via SUBCUTANEOUS

## 2016-05-30 MED ORDER — PROCHLORPERAZINE 25 MG RE SUPP: 12.5000 mg | Freq: Four times a day (QID) | RECTAL | Status: DC | PRN

## 2016-05-30 MED ORDER — TRAZODONE HCL 50 MG PO TABS: 25.0000 mg | ORAL_TABLET | Freq: Every evening | ORAL | Status: DC | PRN

## 2016-05-30 MED ORDER — PANTOPRAZOLE SODIUM 40 MG PO TBEC
40.0000 mg | DELAYED_RELEASE_TABLET | Freq: Every day | ORAL | Status: DC
  Administered 2016-05-31 – 2016-06-13 (×14): 40 mg via ORAL
  Filled 2016-05-30 (×14): qty 1

## 2016-05-30 MED ORDER — ONDANSETRON HCL 4 MG PO TABS: 4.0000 mg | ORAL_TABLET | Freq: Four times a day (QID) | ORAL | Status: DC | PRN

## 2016-05-30 MED ORDER — OXYCODONE HCL 5 MG PO TABS
10.0000 mg | ORAL_TABLET | ORAL | Status: DC | PRN
  Administered 2016-05-31 – 2016-06-07 (×8): 10 mg via ORAL
  Filled 2016-05-30 (×9): qty 2

## 2016-05-30 NOTE — Progress Notes (Signed)
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 960 480   Total Intake(mL/kg) 960 (8.6) 480 (4.3)   Urine (mL/kg/hr) 4350 (1.6)    Drains 0 (0)    Total Output 4350     Net -3390 +480          Labs  Results for RAYVAUGHN, CLEMMENS (MRN [id number]) as of 05/30/2016 10:17  Ref. Range 05/29/2016 18:20  Appearance Latest Ref Range: CLEAR  CLEAR  Bilirubin Urine Latest Ref Range: NEGATIVE  NEGATIVE  Color, Urine Latest Ref Range: YELLOW  YELLOW  Glucose Latest Ref Range: NEGATIVE mg/dL NEGATIVE  Hgb urine dipstick Latest Ref Range: NEGATIVE  NEGATIVE  Ketones, ur Latest Ref Range: NEGATIVE mg/dL NEGATIVE  Leukocytes, UA Latest Ref Range: NEGATIVE  NEGATIVE  Nitrite Latest Ref Range: NEGATIVE  NEGATIVE  pH Latest Ref Range: 5.0 - 8.0  5.5  Protein Latest Ref Range: NEGATIVE mg/dL NEGATIVE  Specific Gravity, Urine Latest Ref Range: 1.005 - 1.030  1.014   CBG (last 3)   Recent Labs  05/29/16 1629 05/29/16 2124 05/30/16 0633  GLUCAP 191* 169* 164*      Exam  Gen: awake and alert, resting comfortably in bed, NAD Abd: + BS, NT Pelvis: vac removed, incision looks excellent. No signs of infection. Pt with some skin irritation from adhesive on abdomen                        scrotal swelling improved  Ext:       B Lower Extremities                       DPN, SPN, TN sensation intact B                        EHL intact B                       FHL, AT, PT, peroneals, gastroc motor intact B                        exts are warm                       + DP pulses B                        No significant swelling      Assessment and Plan   POD/HD#: 16  65 year old white male status post fall off of scaffolding with complex pelvic ring injury, status post IR embolization of left internal iliac artery   -APC 2 pelvic ring  injury s/p ORIF                        NWB R lower extremity                        WBAT Left leg for transfers only                        ROM as tolerated                        Ice PRN                        PT and OT        Please get pt to chair today              Dressing changes as needed            Ok to shower      - Pain management:                       Continue per trauma service   - ABL anemia/Hemodynamics                       CBC in am      - Medical issues                       Diabetes                           Sliding scale                            Home meds                            Tight sugar control to decrease risk of infection and nonunion    Fever   Continue work up by TS   Operative sites do not appear to be a source   Will order B LEx U/S   CXR negative, U/A negative   - DVT/PE prophylaxis:                       SCDs and Lovenox                     start coumadin once H/H stabilizes- likely tomorrow    - ID:                        Perioperative Ancef completed      - Activity                     as above                              Continue therapy                               Get to chair today    - FEN/GI prophylaxis/Foley/Lines:  CHO mod diet                        Pt with urinary retention                                 meds started                                 may need foley again       - Dispo:                        therapies                         CIR in next day or 2 if approved      Jari Pigg, PA-C [facility] [phone] (O) 05/30/2016 10:12 AM

## 2016-05-30 NOTE — Progress Notes (Signed)
   05/30/16 1600  OT Visit Information  Last OT Received On 05/30/16  Assistance Needed +2  PT/OT/SLP Co-Evaluation/Treatment Yes  Reason for Co-Treatment For patient/therapist safety  OT goals addressed during session ADL's and self-care  History of Present Illness pt admitted after falling from scaffolding while building a [employer], resulting in pelvic fx, L3 end plate fx and pelvic hemorrhage. s/p symphysis pubis, transsacral screw and application of wound vac. Pt with significant scrotal swelling.  Precautions  Precautions Back;Fall  Precaution Booklet Issued No  Precaution Comments instructed in log roll technique, care taken with scrotum with mobility  Required Braces or Orthoses Spinal Brace  Spinal Brace TLSO;Applied in supine position  Pain Assessment  Pain Assessment Faces  Faces Pain Scale 6  Pain Location back, scrotum  Pain Descriptors / Indicators Discomfort;Grimacing;Guarding  Pain Intervention(s) Monitored during session;Premedicated before session;Repositioned  Cognition  Arousal/Alertness Awake/alert  Behavior During Therapy WFL for tasks assessed/performed  Overall Cognitive Status Within Functional Limits for tasks assessed  ADL  Overall ADL's  Needs assistance/impaired  Lower Body Bathing Total assistance;Bed level  Lower Body Bathing Details (indicate cue type and reason) pericare in rolling  Bed Mobility  Overal bed mobility Needs Assistance;+2 for physical assistance;+ 2 for safety/equipment  Bed Mobility Rolling;Sit to Sidelying  Rolling Min assist  Sit to sidelying Mod assist;+2 for physical assistance;+2 for safety/equipment  Balance  Sitting balance-Leahy Scale Poor  Standing balance-Leahy Scale Poor  Restrictions  RLE Weight Bearing NWB  LLE Weight Bearing WBAT  Transfers  Equipment used Rolling walker (2 wheeled)  Transfers Sit to/from [employer] Transfers  Sit to Stand Mod assist;+2 physical assistance;+2 safety/equipment   Stand pivot transfers Mod assist;+2 physical assistance;+2 safety/equipment  General transfer comment pt required increased time and mod A to achieve full standing position; pt unable to maintain NWB R LE status throughout SPT even with VC'ing from therapists  OT - End of Session  Equipment Utilized During Treatment Rolling walker;Oxygen;Back brace  Activity Tolerance Patient limited by pain  Patient left in bed;with call bell/phone within reach;with family/visitor present  OT Assessment/Plan  OT Plan Discharge plan remains appropriate  OT Frequency (ACUTE ONLY) Min 2X/week  Follow Up Recommendations CIR;Supervision/Assistance - 24 hour  OT Equipment 3 in 1 bedside comode;Wheelchair (measurements OT);Wheelchair cushion (measurements OT)  OT Goal Progression  Progress towards OT goals Progressing toward goals  Acute Rehab OT Goals  Patient Stated Goal decrease pain  Time For Goal Achievement 06/11/16  Potential to Achieve Goals Good  OT Time Calculation  OT Start Time (ACUTE ONLY) 1344  OT Stop Time (ACUTE ONLY) 1408  OT Time Calculation (min) 24 min  OT General Charges  $OT Visit 1 Procedure  OT Treatments  $Therapeutic Activity 8-22 mins  05/30/2016 Nestor Lewandowsky, OTR/L Pager: [phone]

## 2016-05-30 NOTE — H&P (View-Only) (Signed)
Physical Medicine and Rehabilitation Admission H&P    Chief Complaint  Patient presents with  . Pelvic fracture with hematoma and L3 fracture    HPI:   Corey Sellers is an 65 y.o. white male with history of T2DM, HTN, melanoma who sustained a fall off 8 feet off a scaffolding (working on [employer]) on 05/24/2016 onto his back and had immediate onset of back and pelvic pain with inability to walk. 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 He was taken to IR emergently for iliac artery embolization. Dr. Ronnald Ramp recommended LSO for L3 fracture--to be donned when supine and follow up repeat films in 2 weeks to monitor for stability. Dr. Marcelino Scot consulted for input due to complexity of pelvic fracture and patient underwent ORIF anterior pubic symphysis with transsacral screw fixation right and left.  NWB RLE for 8 weeks and suture removal in 2 weeks.   He has had significant scrotal edema with difficulty voiding--urecholine and flomax added to help with symptoms. Has had abdominal distension with constipation, fevers as well as ABLA. Therapy ongoing and CIR recommended for follow up therapy.    Review of Systems  HENT: Negative for hearing loss.   Eyes: Negative for blurred vision and double vision.  Respiratory: Negative for cough and shortness of breath.   Cardiovascular: Negative for chest pain and palpitations.  Gastrointestinal: Positive for constipation. Negative for heartburn, nausea and vomiting.  Genitourinary: Negative for dysuria and urgency.       Problems voiding--  Musculoskeletal: Positive for back pain and myalgias.  Neurological: Positive for sensory change (bilateral hands in am due to positional changes. ). Negative for dizziness, tingling and headaches.  Psychiatric/Behavioral: The patient is not  nervous/anxious and does not have insomnia.       Past Medical History:  Diagnosis Date  . Diabetes mellitus   . GERD (gastroesophageal reflux disease)   . Hypertension   . 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 Wears glasses     Past Surgical History:  Procedure Laterality Date  . APPENDECTOMY    . BASAL CELL CARCINOMA EXCISION     left chin  . EXCISION METACARPAL MASS Right 05/23/2013   Procedure: RIGHT LONG EXCISION ANNULAR LIGAMENT CYST;  Surgeon: Tennis Must, MD;  Location: [place];  Service: Orthopedics;  Laterality: Right;  . GANGLION CYST EXCISION Left 06/06/2013   Procedure: LEFT WRIST EXCISION MASS;  Surgeon: Tennis Must, MD;  Location: [place];  Service: Orthopedics;  Laterality: Left;  . IR GENERIC HISTORICAL  05/24/2016   IR ANGIOGRAM PELVIS SELECTIVE OR SUPRASELECTIVE 05/24/2016 Arne Cleveland, MD [facility]  . IR GENERIC HISTORICAL  05/24/2016   IR US GUIDE VASC ACCESS RIGHT 05/24/2016 Arne Cleveland, MD [facility]  . IR GENERIC HISTORICAL  05/24/2016   IR EMBO ART  VEN HEMORR LYMPH EXTRAV  INC GUIDE ROADMAPPING 05/24/2016 Arne Cleveland, MD [facility]  . JOINT REPLACEMENT  2003,2004   Rt shoulder  . ORIF PELVIC FRACTURE N/A 05/27/2016   Procedure: OPEN REDUCTION INTERNAL FIXATION (ORIF) SYMPHYSIS PUBIS W/ TRANSSACRAL SCREWS;  Surgeon: Altamese Hornsby Bend, MD;  Location: [place];  Service: Orthopedics;  Laterality: N/A;  . PAROTIDECTOMY W/ NECK DISSECTION TOTAL  2013   left parotidectomy-26 nodes removed  .  TONSILLECTOMY    . TRIGGER FINGER RELEASE Right 05/23/2013   Procedure: RELEASE TRIGGER FINGER/A-1 PULLEY RIGHT INDEX AND LONG;  Surgeon: Tennis Must, MD;  Location: [place];  Service: Orthopedics;  Laterality: Right;  . TRIGGER FINGER RELEASE Left 06/06/2013   Procedure: RELEASE TRIGGER FINGER/A-1 PULLEY LEFT SMALL;  Surgeon: Tennis Must, MD;  Location: [place];  Service: Orthopedics;  Laterality: Left;  Marland Kitchen VARICOCELE EXCISION     left    Family History  Problem Relation Age of Onset  . High Cholesterol Mother   . Mesothelioma Father       Social History:  Married. Works as Financial trader. He reports that he quit smoking about 17 years ago. He has never used smokeless tobacco. He reports that he drinks alcohol--beer and wine. He reports that he does not use drugs.    Allergies  Allergen Reactions  . Bee Venom Anaphylaxis  . Other Rash and Other (See Comments)    METALS. Patient has metal sensitivity, including metal sutures.  Causes severe irritation. Metal sensitivity - redness and irritation at site when staples used in incision closure    Medications Prior to Admission  Medication Sig Dispense Refill  . aspirin EC 81 MG tablet Take 81 mg by mouth every evening.    . cetirizine (ZYRTEC) 10 MG tablet Take 10 mg by mouth every evening.     . Cholecalciferol 4000 units CAPS Take 1 capsule by mouth every evening.    . cyclobenzaprine (FLEXERIL) 10 MG tablet Take 10 mg by mouth 3 (three) times daily as needed.    Marland Kitchen EPINEPHrine 0.3 mg/0.3 mL IJ SOAJ injection Inject 0.3 mg into the muscle once as needed.    . etodolac (LODINE XL) 400 MG 24 hr tablet Take 800 mg by mouth every evening.    . metFORMIN (GLUCOPHAGE-XR) 500 MG 24 hr tablet Take 1,000 mg by mouth every evening.    . Multiple Vitamin (MULTIVITAMIN WITH MINERALS) TABS Take 1 tablet by mouth daily.    . Olmesartan-Amlodipine-HCTZ (TRIBENZOR) 40-10-25 MG TABS Take 1 tablet by mouth every evening.    Marland Kitchen omeprazole (PRILOSEC) 20 MG capsule Take 20 mg by mouth daily.    . rosuvastatin (CRESTOR) 40 MG tablet Take 20 mg by mouth every evening.    . sitaGLIPtin (JANUVIA) 100 MG tablet Take 100 mg by mouth every evening.      Home: Home Living Family/patient expects to be discharged to:: Private residence Living Arrangements: Spouse/significant  other Available Help at Discharge: Family, Available PRN/intermittently Type of Home: House Home Access: Stairs to enter [employer] of Steps: 4 down Entrance Stairs-Rails: None Home Layout: Two level, Able to live on main level with bedroom/bathroom Bathroom Toilet: Standard Home Equipment: None   Functional History: Prior Function Level of Independence: Independent Comments: 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  Functional Status:  Mobility: Bed Mobility Overal bed mobility: Needs Assistance, +2 for physical assistance, + 2 for safety/equipment Bed Mobility: Rolling, Sidelying to Sit, Sit to Sidelying Rolling: Mod assist, +2 for physical assistance, +2 for safety/equipment Sidelying to sit: Mod assist, +2 for physical assistance, +2 for safety/equipment Sit to sidelying: Mod assist, +2 for physical assistance, +2 for safety/equipment General bed mobility comments: rolled to apply and remove brace, used log roll technique assisting with LEs and trunk to sit EOB, used pillow between knees to protect scrotum and assisted hips with bed pad Transfers Overall transfer  level: Needs assistance Equipment used: Rolling walker (2 wheeled) Transfers: Sit to/from Stand Sit to Stand: Mod assist, +2 physical assistance, +2 safety/equipment General transfer comment: pt required increased time and mod A to achieve full standing position; pt able to maintain NWB R LE status throughout Ambulation/Gait General Gait Details: pt attempted to advance L LE, however, unable to do so secondary to pain and pt reporting the L LE was too weak to move    ADL: ADL Overall ADL's : Needs assistance/impaired Eating/Feeding: Independent, Bed level Grooming: Wash/dry hands, Wash/dry face, Bed level, Set up Upper Body Bathing: Maximal assistance, Bed level Lower Body Bathing: Total assistance, Bed level Upper Body Dressing : Maximal assistance, Bed level Lower Body Dressing: Total  assistance, Bed level  Cognition: Cognition Overall Cognitive Status: Within Functional Limits for tasks assessed Orientation Level: Oriented X4 Cognition Arousal/Alertness: Awake/alert Behavior During Therapy: WFL for tasks assessed/performed Overall Cognitive Status: Within Functional Limits for tasks assessed     Blood pressure (!) 139/57, pulse 100, temperature 100.1 F (37.8 C), temperature source Oral, resp. rate 16, height 5\' 9"  (1.753 m), weight 112 kg (247 lb), SpO2 98 %. Physical Exam  Nursing note and vitals reviewed. Constitutional: He is oriented to person, place, and time. He appears well-developed and well-nourished. Nasal cannula in place.  Up in chair reporting back discomfort.   HENT:  Head: Normocephalic and atraumatic.  Right Ear: External ear normal.  Left Ear: External ear normal.  Mouth/Throat: Oropharynx is clear and moist.  Eyes: Conjunctivae are normal. Pupils are equal, round, and reactive to light. Right eye exhibits no discharge. Left eye exhibits no discharge. No scleral icterus.  Neck: Normal range of motion. Neck supple.  Cardiovascular: Normal rate and regular rhythm.  Exam reveals no gallop and no friction rub.   No murmur heard. Respiratory: Effort normal and breath sounds normal. No stridor. No respiratory distress. He has no wheezes.  GI: Normal appearance and bowel sounds are normal. He exhibits distension. There is no tenderness.  Lower abdominal wound with dry surgical dressing in place.  Genitourinary: Penile tenderness present.  Genitourinary Comments: Scrotum edematous with diffuse ecchymosis?   Musculoskeletal: He exhibits edema. He exhibits no tenderness.  Neurological: He is alert and oriented to person, place, and time.  Speech clear. Able to follow basic commands without difficulty. UE strength grossly 5/5. LE: 2-/5HF , 2+ KE and 4/5 ADF/PF. No sensory changes. Cognitively appears intact  Skin: Skin is warm and dry. There is  erythema.  Hypogastric incision intact with sutures, no discharge  Psychiatric: He has a normal mood and affect. His behavior is normal. Thought content normal.    Results for orders placed or performed during the hospital encounter of 05/24/16 (from the past 48 hour(s))  Glucose, capillary     Status: Abnormal   Collection Time: 05/28/16  4:35 PM  Result Value Ref Range   Glucose-Capillary 132 (H) 65 - 99 mg/dL   Comment 1 Notify RN   Glucose, capillary     Status: Abnormal   Collection Time: 05/28/16  9:40 PM  Result Value Ref Range   Glucose-Capillary 159 (H) 65 - 99 mg/dL  CBC     Status: Abnormal   Collection Time: 05/29/16  3:43 AM  Result Value Ref Range   WBC 7.2 4.0 - 10.5 K/uL   RBC 2.76 (L) 4.22 - 5.81 MIL/uL   Hemoglobin 8.0 (L) 13.0 - 17.0 g/dL   HCT 25.0 (L) 39.0 - 52.0 %  MCV 90.6 78.0 - 100.0 fL   MCH 29.0 26.0 - 34.0 pg   MCHC 32.0 30.0 - 36.0 g/dL   RDW 13.8 11.5 - 15.5 %   Platelets 148 (L) 150 - 400 K/uL  Glucose, capillary     Status: Abnormal   Collection Time: 05/29/16  6:49 AM  Result Value Ref Range   Glucose-Capillary 160 (H) 65 - 99 mg/dL  Glucose, capillary     Status: Abnormal   Collection Time: 05/29/16 11:45 AM  Result Value Ref Range   Glucose-Capillary 161 (H) 65 - 99 mg/dL  Glucose, capillary     Status: Abnormal   Collection Time: 05/29/16  4:29 PM  Result Value Ref Range   Glucose-Capillary 191 (H) 65 - 99 mg/dL   Comment 1 Notify RN   Urinalysis, Routine w reflex microscopic (not at [facility])     Status: None   Collection Time: 05/29/16  6:20 PM  Result Value Ref Range   Color, Urine YELLOW YELLOW   APPearance CLEAR CLEAR   Specific Gravity, Urine 1.014 1.005 - 1.030   pH 5.5 5.0 - 8.0   Glucose, UA NEGATIVE NEGATIVE mg/dL   Hgb urine dipstick NEGATIVE NEGATIVE   Bilirubin Urine NEGATIVE NEGATIVE   Ketones, ur NEGATIVE NEGATIVE mg/dL   Protein, ur NEGATIVE NEGATIVE mg/dL   Nitrite NEGATIVE NEGATIVE   Leukocytes, UA NEGATIVE  NEGATIVE    Comment: MICROSCOPIC NOT DONE ON URINES WITH NEGATIVE PROTEIN, BLOOD, LEUKOCYTES, NITRITE, OR GLUCOSE <1000 mg/dL.  Glucose, capillary     Status: Abnormal   Collection Time: 05/29/16  9:24 PM  Result Value Ref Range   Glucose-Capillary 169 (H) 65 - 99 mg/dL  Glucose, capillary     Status: Abnormal   Collection Time: 05/30/16  6:33 AM  Result Value Ref Range   Glucose-Capillary 164 (H) 65 - 99 mg/dL  CBC     Status: Abnormal   Collection Time: 05/30/16 10:10 AM  Result Value Ref Range   WBC 6.9 4.0 - 10.5 K/uL   RBC 2.73 (L) 4.22 - 5.81 MIL/uL   Hemoglobin 7.8 (L) 13.0 - 17.0 g/dL   HCT 24.6 (L) 39.0 - 52.0 %   MCV 90.1 78.0 - 100.0 fL   MCH 28.6 26.0 - 34.0 pg   MCHC 31.7 30.0 - 36.0 g/dL   RDW 13.5 11.5 - 15.5 %   Platelets 180 150 - 400 K/uL  Glucose, capillary     Status: Abnormal   Collection Time: 05/30/16 11:34 AM  Result Value Ref Range   Glucose-Capillary 181 (H) 65 - 99 mg/dL   Dg Chest 2 View  Result Date: 05/29/2016 CLINICAL DATA:  RIGHT-sided chest pain and fever for 2 days after surgery. History of hypertension and diabetes. EXAM: CHEST  2 VIEW COMPARISON:  Chest radiograph May 24, 2016 FINDINGS: Cardiomediastinal silhouette is unremarkable for this low inspiratory portable examination with crowded vasculature markings. The lungs are clear without pleural effusions or focal consolidations. Trachea projects midline and there is no pneumothorax. Included soft tissue planes and osseous structures are non-suspicious. Postoperative changes distal RIGHT clavicle. IMPRESSION: No active cardiopulmonary disease. Electronically Signed   By: Elon Alas M.D.   On: 05/29/2016 17:50       Medical Problem List and Plan: 1.  Mobility and functional deficits secondary to polytrauma as above  -begin CIR 2.  DVT Prophylaxis/Anticoagulation: Pharmaceutical: Lovenox 3. Pain Management: Oxycodone prn effective.  4. Mood: LCSW to follow for evaluation and support.  5. Neuropsych: This patient is capable of making decisions on his own behalf. 6. Skin/Wound Care: Monitor wound of healing.  7. Fluids/Electrolytes/Nutrition: Monitor I/O. Check lytes in am.  8. L3 endplate fracture: Back precautions --don LSO when patient supine 9. ABLA: Serial CBC to monitor H/H  10. Fevers: Encourage IS--will check UA/UCS  -start patient on bowel program for constipation  -check dopplers 11.  Urinary retention:  Likely due to pelvic hematoma, immobility, constipation. Continue to  voiding with PVR checks. Continue urecholine and flomax. Rule out UTI as cause of infection.  12. T2DM: Monitor BS ac/hs. On metformin--use SSI for elevated BS.  13. HTN: Will monitor BP bid--continue avapro, norvasc and HCTZ.  14. Constipation: Will schedule miralax bid.     Post Admission Physician Evaluation: 1. Functional deficits secondary  to polytrauma. 2. Patient is admitted to receive collaborative, interdisciplinary care between the physiatrist, rehab nursing staff, and therapy team. 3. Patient's level of medical complexity and substantial therapy needs in context of that medical necessity cannot be provided at a lesser intensity of care such as a SNF. 4. Patient has experienced substantial functional loss from his/her baseline which was documented above under the "Functional History" and "Functional Status" headings.  Judging by the patient's diagnosis, physical exam, and functional history, the patient has potential for functional progress which will result in measurable gains while on inpatient rehab.  These gains will be of substantial and practical use upon discharge  in facilitating mobility and self-care at the household level. 5. Physiatrist will provide 24 hour management of medical needs as well as oversight of the therapy plan/treatment and provide guidance as appropriate regarding the interaction of the two. 6. 24 hour rehab nursing will assist with bladder management, bowel  management, safety, skin/wound care, disease management, medication administration, pain management and patient education  and help integrate therapy concepts, techniques,education, etc. 7. PT will assess and treat for/with: Lower extremity strength, range of motion, stamina, balance, functional mobility, safety, adaptive techniques and equipment, pain mgt, ortho precautions, ego support.   Goals are: mod I. 8. OT will assess and treat for/with: ADL's, functional mobility, safety, upper extremity strength, adaptive techniques and equipment, ortho precautions, pain mgt, ego support, community reintegration.   Goals are: mod I. Therapy may proceed with showering this patient. 9. SLP will assess and treat for/with: n/a.  Goals are: n/a. 10. Case Management and Social Worker will assess and treat for psychological issues and discharge planning. 11. Team conference will be held weekly to assess progress toward goals and to determine barriers to discharge. 12. Patient will receive at least 3 hours of therapy per day at least 5 days per week. 13. ELOS: 9-13 days       14. Prognosis:  excellent     Meredith Staggers, MD, [facility] 05/30/2016  05/30/2016

## 2016-05-30 NOTE — Progress Notes (Signed)
Occupational Therapy Treatment Patient Details Name: [place] JANK MRN: [id number] DOB: 05-09-1951 Today's Date: 05/30/2016    History of present illness pt admitted after falling from scaffolding while building a [employer], resulting in pelvic fx, L3 end plate fx and pelvic hemorrhage. s/p symphysis pubis, transsacral screw and application of wound vac. Pt with significant scrotal swelling.   OT comments  Pt able to pivot to chair with RW with 2 person assist this visit. Attempted to use Stedy, but pt unable to tolerate his legs that close together. Minimal use of UEs to assist with maintaining NWB on R LE. Pivoted to L side. Goal to stay up in chair x 1 hour.  Follow Up Recommendations  CIR;Supervision/Assistance - 24 hour    Equipment Recommendations  3 in 1 bedside comode;Wheelchair (measurements OT);Wheelchair cushion (measurements OT)    Recommendations for Other Services      Precautions / Restrictions Precautions Precautions: Back;Fall Precaution Booklet Issued: No Precaution Comments: instructed in log roll technique, care taken with scrotum with mobility Required Braces or Orthoses: Spinal Brace Spinal Brace: Thoracolumbosacral orthotic;Applied in supine position Restrictions Weight Bearing Restrictions: Yes RLE Weight Bearing: Non weight bearing LLE Weight Bearing: Weight bearing as tolerated       Mobility Bed Mobility Overal bed mobility: Needs Assistance;+2 for physical assistance;+ 2 for safety/equipment Bed Mobility: Rolling;Sidelying to Sit Rolling: Min assist Sidelying to sit: Mod assist;+2 for physical assistance;+2 for safety/equipment       General bed mobility comments: Pt required assist with donning TLSO but was able to roll with min A  Transfers Overall transfer level: Needs assistance Equipment used: Rolling walker (2 wheeled) Transfers: Sit to/from [facility] Sit to Stand: Mod assist;+2 physical assistance;+2  safety/equipment Stand pivot transfers: Mod assist;+2 physical assistance;+2 safety/equipment       General transfer comment: pt required increased time and mod A to achieve full standing position; pt unable to maintain NWB R LE status throughout SPT even with VC'ing from therapists    Balance Overall balance assessment: Needs assistance Sitting-balance support: Feet supported Sitting balance-Leahy Scale: Poor Sitting balance - Comments: pt able to sustain upright sitting with bilateral UE supports and min guard   Standing balance support: During functional activity;Bilateral upper extremity supported Standing balance-Leahy Scale: Poor Standing balance comment: In static standing, pt amble to maintain NWB R LE                   ADL                                                Vision                     Perception     Praxis      Cognition   Behavior During Therapy: WFL for tasks assessed/performed Overall Cognitive Status: Within Functional Limits for tasks assessed                       Extremity/Trunk Assessment               Exercises     Shoulder Instructions       General Comments      Pertinent Vitals/ Pain       Pain Assessment: Faces Pain Score: 2  Faces Pain Scale:  Hurts even more Pain Location: back, scrotum Pain Descriptors / Indicators: Aching;Guarding;Grimacing Pain Intervention(s): Monitored during session;Premedicated before session;Repositioned;Limited activity within patient's tolerance  Home Living                                          Prior Functioning/Environment              Frequency Min 2X/week     Progress Toward Goals  OT Goals(current goals can now be found in the care plan section)  Progress towards OT goals: Progressing toward goals  Acute Rehab OT Goals Patient Stated Goal: decrease pain Time For Goal Achievement: 06/11/16 Potential to Achieve  Goals: Good  Plan Discharge plan remains appropriate    Co-evaluation    PT/OT/SLP Co-Evaluation/Treatment: Yes Reason for Co-Treatment: For patient/therapist safety PT goals addressed during session: Mobility/safety with mobility;Balance;Proper use of DME;Strengthening/ROM OT goals addressed during session: Strengthening/ROM      End of Session Equipment Utilized During Treatment: Rolling walker;Oxygen;Back brace (2L)   Activity Tolerance Patient limited by pain   Patient Left in chair;with call bell/phone within reach   Nurse Communication Mobility status;Patient requests pain meds        Time: 1220-1245 OT Time Calculation (min): 25 min  Charges: OT General Charges $OT Visit: 1 Procedure OT Treatments $Therapeutic Activity: 8-22 mins  Malka So 05/30/2016, 2:21 PM  [phone]

## 2016-05-30 NOTE — Progress Notes (Signed)
Physical Therapy Treatment Patient Details Name: Corey Sellers MRN: [id number] DOB: October 01, 1950 Today's Date: 05/30/2016    History of Present Illness pt admitted after falling from scaffolding while building a [employer], resulting in pelvic fx, L3 end plate fx and pelvic hemorrhage. s/p symphysis pubis, transsacral screw and application of wound vac. Pt with significant scrotal swelling.    PT Comments    Pt presented sitting OOB in recliner when PT entered room. Pt tolerated sitting in recliner for 1 hour. Attempted to use bariatric Stedy but pt unable to tolerate bilateral LEs adducted far enough to allow the Stedy to be positioned. Pt again with minimal use of UEs on RW to assist with maintaining NWB of R LE. Pt would continue to benefit from skilled physical therapy services at this time while admitted and after d/c to address his limitations in order to improve his overall safety and independence with functional mobility.   Follow Up Recommendations  CIR     Equipment Recommendations  None recommended by PT;Other (comment) (defer to next venue)    Recommendations for Other Services Rehab consult     Precautions / Restrictions Precautions Precautions: Back;Fall Precaution Booklet Issued: No Precaution Comments: instructed in log roll technique, care taken with scrotum with mobility Required Braces or Orthoses: Spinal Brace Spinal Brace: Thoracolumbosacral orthotic;Applied in supine position Restrictions Weight Bearing Restrictions: Yes RLE Weight Bearing: Non weight bearing LLE Weight Bearing: Weight bearing as tolerated    Mobility  Bed Mobility Overal bed mobility: Needs Assistance;+2 for physical assistance;+ 2 for safety/equipment Bed Mobility: Rolling;Sit to Sidelying Rolling: Min assist Sidelying to sit: Mod assist;+2 for physical assistance;+2 for safety/equipment     Sit to sidelying: Mod assist;+2 for physical assistance;+2 for  safety/equipment General bed mobility comments: Pt required assist with donning TLSO but was able to roll with min A  Transfers Overall transfer level: Needs assistance Equipment used: Rolling walker (2 wheeled) Transfers: Sit to/from [facility] Sit to Stand: Mod assist;+2 physical assistance;+2 safety/equipment Stand pivot transfers: Mod assist;+2 physical assistance;+2 safety/equipment       General transfer comment: pt required increased time and mod A to achieve full standing position; pt unable to maintain NWB R LE status throughout SPT even with VC'ing from therapists  Ambulation/Gait                 Stairs            Wheelchair Mobility    Modified Rankin (Stroke Patients Only)       Balance Overall balance assessment: Needs assistance Sitting-balance support: Feet supported;Bilateral upper extremity supported Sitting balance-Leahy Scale: Poor Sitting balance - Comments: pt able to sustain upright sitting with bilateral UE supports and min guard   Standing balance support: During functional activity;Bilateral upper extremity supported Standing balance-Leahy Scale: Poor Standing balance comment: In static standing, pt amble to maintain NWB R LE                    Cognition Arousal/Alertness: Awake/alert Behavior During Therapy: WFL for tasks assessed/performed Overall Cognitive Status: Within Functional Limits for tasks assessed                      Exercises      General Comments        Pertinent Vitals/Pain Pain Assessment: Faces Pain Score: 2  Faces Pain Scale: Hurts even more Pain Location: back, scrotum Pain Descriptors / Indicators: Discomfort;Grimacing;Guarding Pain Intervention(s): Monitored during  session;Repositioned    Home Living                      Prior Function            PT Goals (current goals can now be found in the care plan section) Acute Rehab PT Goals Patient Stated  Goal: decrease pain PT Goal Formulation: With patient Time For Goal Achievement: 06/11/16 Potential to Achieve Goals: Good Progress towards PT goals: Progressing toward goals    Frequency  Min 3X/week    PT Plan Current plan remains appropriate    Co-evaluation PT/OT/SLP Co-Evaluation/Treatment: Yes Reason for Co-Treatment: For patient/therapist safety PT goals addressed during session: Mobility/safety with mobility;Balance;Proper use of DME;Strengthening/ROM OT goals addressed during session: Strengthening/ROM     End of Session Equipment Utilized During Treatment: Gait belt;Other (comment) (TLSO) Activity Tolerance: Patient limited by pain Patient left: in bed;with call bell/phone within reach;with family/visitor present     Time: [id number] PT Time Calculation (min) (ACUTE ONLY): 23 min  Charges:  $Therapeutic Activity: 8-22 mins                    G CodesClearnce Sorrel Jovanni Eckhart 2016/06/09, 2:48 PM Sherie Don, [place], DPT [phone]

## 2016-05-30 NOTE — Plan of Care (Signed)
Problem: Self-Concept: Goal: Ability to maintain and perform role responsibilities to the fullest extent possible will improve Outcome: Progressing Remains in the bed Goal: Verbalizations of decreased anxiety will increase Outcome: Progressing No anxiety noted this shift  Problem: Pain Management: Goal: Pain level will decrease Outcome: Progressing No pain at this moment

## 2016-05-30 NOTE — Progress Notes (Signed)
Report given to Deborah-RN at [facility]. All questions/concerns addressed. Pt will be transporting in the bed to 4W18. Will continue to monitor

## 2016-05-30 NOTE — Progress Notes (Signed)
Pt requesting to be I&O catheterized to "relieve pressure". Scanned pt's bladder since pt had last been catheterized at 1130 and had not reached the 6 hr limit. Bladder scan did not reveal any urine but pt still insisted on being catheterized. Called Brooke-PA for direction and was told the feeling of pressure is most likely related to the pt's scrotal swelling and abdominal swelling since surgery. Explained rational to pt and his increased risk for infection/trauma to his urethra. Pt verbalized understanding and agreed to wait and try to void on his own by 1730. Will continue to monitor

## 2016-05-30 NOTE — Progress Notes (Signed)
Rehab admissions - I have authorization for acute inpatient rehab admission for today.  I spoke with trauma team and I have medical clearance for inpatient rehab admission for today.  Bed available and will admit to acute inpatient rehab today.  Call me for questions.  [phone]

## 2016-05-30 NOTE — Progress Notes (Signed)
Patient information reviewed and entered into eRehab system by Tahliyah Anagnos, RN, CRRN, PPS Coordinator.  Information including medical coding and functional independence measure will be reviewed and updated through discharge.    

## 2016-05-30 NOTE — Progress Notes (Signed)
Ankit Lorie Phenix, MD Physician Signed Physical Medicine and Rehabilitation  Consult Note Date of Service: 05/28/2016 2:03 PM  Related encounter: ED to Hosp-Admission (Current) from 05/24/2016 in [place] All Collapse All   [] Hide copied text [] Hover for attribution information      Physical Medicine and Rehabilitation Consult Reason for Consult: Multitrauma after fall with pelvic fracture, L3 endplate fracture and pelvic hemorrhage Referring Physician: Trauma services   HPI: SAVALAS WIDMAN is a 65 y.o. right handed male with history of hypertension, diabetes mellitus. Per chart review patient lives with spouse independent prior to admission. 2 level home with bedroom first floor. Four steps to entry. Wife works day shift. Presented 05/24/2016 after a fall 8-10 feet from scaffolding while building a [employer] home. Denied loss of consciousness. X-rays and imaging revealed pelvic diastasis with pelvic hematoma, right sacral fracture, L3 superior endplate fracture. Status post angioembolization pelvic hemorrhage for interventional radiology. Underwent ORIF of anterior pubic symphysis, screw fixation right left application of small wound VAC 05/27/2016 per Dr. Marcelino Scot. Weightbearing as tolerated left lower extremity for transfers only and nonweightbearing right lower extremity. Subcutaneous Lovenox for DVT prophylaxis. LSO brace placed for L3 endplate fracture per neurosurgery Dr. Sherley Bounds. Hospital course pain management. Acute blood loss anemia 8.2. Physical and occupational therapy evaluation completed 05/28/2016 with recommendations of physical medicine rehabilitation consult  Review of Systems  Constitutional: Negative for chills and fever.  HENT: Negative for hearing loss.   Eyes: Negative for blurred vision and double vision.  Respiratory: Negative for cough and shortness of breath.   Cardiovascular: Negative for chest  pain, palpitations and leg swelling.  Gastrointestinal: Positive for constipation. Negative for nausea and vomiting.  Genitourinary: Positive for urgency. Negative for dysuria and hematuria.  Musculoskeletal: Positive for myalgias.  Skin: Negative for rash.  Neurological: Negative for seizures, loss of consciousness, weakness and headaches.  All other systems reviewed and are negative.      Past Medical History:  Diagnosis Date  . Diabetes mellitus   . GERD (gastroesophageal reflux disease)   . Hypertension   . 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 Wears glasses         Past Surgical History:  Procedure Laterality Date  . APPENDECTOMY    . BASAL CELL CARCINOMA EXCISION     left chin  . EXCISION METACARPAL MASS Right 05/23/2013   Procedure: RIGHT LONG EXCISION ANNULAR LIGAMENT CYST;  Surgeon: Tennis Must, MD;  Location: [place];  Service: Orthopedics;  Laterality: Right;  . GANGLION CYST EXCISION Left 06/06/2013   Procedure: LEFT WRIST EXCISION MASS;  Surgeon: Tennis Must, MD;  Location: [place];  Service: Orthopedics;  Laterality: Left;  . IR GENERIC HISTORICAL  05/24/2016   IR ANGIOGRAM PELVIS SELECTIVE OR SUPRASELECTIVE 05/24/2016 Arne Cleveland, MD [facility]  . IR GENERIC HISTORICAL  05/24/2016   IR US GUIDE VASC ACCESS RIGHT 05/24/2016 Arne Cleveland, MD [facility]  . IR GENERIC HISTORICAL  05/24/2016   IR EMBO ART  VEN HEMORR LYMPH EXTRAV  INC GUIDE ROADMAPPING 05/24/2016 Arne Cleveland, MD [facility]  . JOINT REPLACEMENT  2003,2004   Rt shoulder  . PAROTIDECTOMY W/ NECK DISSECTION TOTAL  2013   left parotidectomy-26 nodes removed  . TONSILLECTOMY    . TRIGGER FINGER RELEASE Right 05/23/2013   Procedure: RELEASE TRIGGER FINGER/A-1  PULLEY RIGHT INDEX AND LONG;  Surgeon: Tennis Must, MD;  Location: [place];  Service: Orthopedics;   Laterality: Right;  . TRIGGER FINGER RELEASE Left 06/06/2013   Procedure: RELEASE TRIGGER FINGER/A-1 PULLEY LEFT SMALL;  Surgeon: Tennis Must, MD;  Location: [place];  Service: Orthopedics;  Laterality: Left;  Marland Kitchen VARICOCELE EXCISION     left   History reviewed. No pertinent family history. Social History:  reports that he quit smoking about 17 years ago. He does not have any smokeless tobacco history on file. He reports that he drinks alcohol. He reports that he does not use drugs. Allergies:       Allergies  Allergen Reactions  . Bee Venom Anaphylaxis  . Other Rash and Other (See Comments)    METALS. Patient has metal sensitivity, including metal sutures.  Causes severe irritation. Metal sensitivity - redness and irritation at site when staples used in incision closure         Medications Prior to Admission  Medication Sig Dispense Refill  . aspirin EC 81 MG tablet Take 81 mg by mouth every evening.    . cetirizine (ZYRTEC) 10 MG tablet Take 10 mg by mouth every evening.     . Cholecalciferol 4000 units CAPS Take 1 capsule by mouth every evening.    . cyclobenzaprine (FLEXERIL) 10 MG tablet Take 10 mg by mouth 3 (three) times daily as needed.    Marland Kitchen EPINEPHrine 0.3 mg/0.3 mL IJ SOAJ injection Inject 0.3 mg into the muscle once as needed.    . etodolac (LODINE XL) 400 MG 24 hr tablet Take 800 mg by mouth every evening.    . metFORMIN (GLUCOPHAGE-XR) 500 MG 24 hr tablet Take 1,000 mg by mouth every evening.    . Multiple Vitamin (MULTIVITAMIN WITH MINERALS) TABS Take 1 tablet by mouth daily.    . Olmesartan-Amlodipine-HCTZ (TRIBENZOR) 40-10-25 MG TABS Take 1 tablet by mouth every evening.    Marland Kitchen omeprazole (PRILOSEC) 20 MG capsule Take 20 mg by mouth daily.    . rosuvastatin (CRESTOR) 40 MG tablet Take 20 mg by mouth every evening.    . sitaGLIPtin (JANUVIA) 100 MG tablet Take 100 mg by mouth every evening.      Home: Home  Living Family/patient expects to be discharged to:: Private residence Living Arrangements: Spouse/significant other Available Help at Discharge: Family, Available PRN/intermittently Type of Home: House Home Access: Stairs to enter [employer] of Steps: 4 down Entrance Stairs-Rails: None Home Layout: Two level, Able to live on main level with bedroom/bathroom Bathroom Toilet: Standard Home Equipment: None  Functional History: Prior Function Level of Independence: Independent Comments: 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 Functional Status:  Mobility: Bed Mobility Overal bed mobility: Needs Assistance, +2 for physical assistance, + 2 for safety/equipment Bed Mobility: Rolling, Sidelying to Sit, Sit to Sidelying Rolling: Mod assist, +2 for physical assistance, +2 for safety/equipment Sidelying to sit: Max assist, +2 for physical assistance, +2 for safety/equipment Sit to sidelying: Max assist, +2 for physical assistance, +2 for safety/equipment General bed mobility comments: rolled to apply and remove brace, used log roll technique assisting with LEs and trunk to sit EOB, used pillow between knees to protect scrotum and assisted hips with bed pad Transfers Overall transfer level: Needs assistance Equipment used: Rolling walker (2 wheeled), 2 person hand held assist (PT supported L knee with her knee) Transfers: Sit to/from Stand Sit to Stand: Max assist, +2 physical assistance, +2 safety/equipment,  From elevated surface General transfer comment: assist with pad under hips, L knee blocked, walker stabilized as pt pulled up, cues for NWB on R LE Ambulation/Gait General Gait Details: unable to attempt due to pain and NWB on RLE    ADL: ADL Overall ADL's : Needs assistance/impaired Eating/Feeding: Independent, Bed level Grooming: Wash/dry hands, Wash/dry face, Bed level, Set up Upper Body Bathing: Maximal assistance, Bed level Lower Body Bathing: Total  assistance, Bed level Upper Body Dressing : Maximal assistance, Bed level Lower Body Dressing: Total assistance, Bed level  Cognition: Cognition Overall Cognitive Status: Within Functional Limits for tasks assessed Orientation Level: Oriented X4 Cognition Arousal/Alertness: Awake/alert Behavior During Therapy: WFL for tasks assessed/performed Overall Cognitive Status: Within Functional Limits for tasks assessed  Blood pressure (!) 156/67, pulse (!) 116, temperature (!) 100.6 F (38.1 C), temperature source Oral, resp. rate 18, height 5\' 9"  (0000000 m), weight 112 kg (247 lb), SpO2 98 %. Physical Exam  Vitals reviewed. Constitutional: He is oriented to person, place, and time. He appears well-developed and well-nourished.  HENT:  Head: Normocephalic and atraumatic.  Eyes: Conjunctivae and EOM are normal.  Neck: Normal range of motion. Neck supple. No thyromegaly present.  Cardiovascular: Normal rate and regular rhythm.   Respiratory: Effort normal and breath sounds normal. No respiratory distress.  GI: Soft. Bowel sounds are normal. He exhibits no distension.  Musculoskeletal: He exhibits edema and tenderness.  Neurological: He is alert and oriented to person, place, and time.  Sensation intact to light touch Motor: B/l UE 5/5 proximal to distal B/l LE proximally >4/5 (pain inhibition), ankle dorsi/plantar flexion 5/5 DTRs symmetric  Skin: Skin is warm and dry.  Surgical site clean and dry with wound VAC  Psychiatric: His affect is blunt. He is agitated.   Lab Results Last 24 Hours       Results for orders placed or performed during the hospital encounter of 05/24/16 (from the past 24 hour(s))  Glucose, capillary     Status: Abnormal   Collection Time: 05/27/16  4:55 PM  Result Value Ref Range   Glucose-Capillary 213 (H) 65 - 99 mg/dL   Comment 1 Document in Chart   Glucose, capillary     Status: Abnormal   Collection Time: 05/27/16 11:58 PM  Result Value Ref Range    Glucose-Capillary 192 (H) 65 - 99 mg/dL  CBC     Status: Abnormal   Collection Time: 05/28/16  4:59 AM  Result Value Ref Range   WBC 7.8 4.0 - 10.5 K/uL   RBC 2.82 (L) 4.22 - 5.81 MIL/uL   Hemoglobin 8.2 (L) 13.0 - 17.0 g/dL   HCT 25.7 (L) 39.0 - 52.0 %   MCV 91.1 78.0 - 100.0 fL   MCH 29.1 26.0 - 34.0 pg   MCHC 31.9 30.0 - 36.0 g/dL   RDW 13.9 11.5 - 15.5 %   Platelets 133 (L) 150 - 400 K/uL  Glucose, capillary     Status: Abnormal   Collection Time: 05/28/16  6:12 AM  Result Value Ref Range   Glucose-Capillary 174 (H) 65 - 99 mg/dL  Glucose, capillary     Status: Abnormal   Collection Time: 05/28/16 11:57 AM  Result Value Ref Range   Glucose-Capillary 171 (H) 65 - 99 mg/dL      Imaging Results (Last 48 hours)  Dg Pelvis Comp Min 3v  Result Date: 05/27/2016 CLINICAL DATA:  Recent pubic symphysis diastasis EXAM: [facility]ET PELVIS - 3+ VIEW COMPARISON:  Pelvis radiograph May 24, 2016 FINDINGS: Neutral frontal, and left frontal, mallet frontal views obtained. There is screw and plate fixation through the superior pubic rami with resolution of pubic symphysis diastases. A screw transfixes the sacrum. There is no sacroiliac joint diastases appreciable on the submitted images. No acute fracture evident. There is slight symmetric narrowing of both hip joints. IMPRESSION: Postoperative fixation with no diastases appreciable currently. Slight symmetric narrowing of both hip joints. No acute fracture evident. Electronically Signed   By: Lowella Grip III M.D.   On: 05/27/2016 12:17   Dg Pelvis Comp Min 3v  Result Date: 05/27/2016 CLINICAL DATA:  SI screw insertion And pelvic ring plate for ORIF, please note, screw insertion is on the PT RIGHT, images are sent to pacs, 48.2 seconds fluoro EXAM: [facility] PELVIS - 3+ VIEW; DG C-ARM 61-120 MIN COMPARISON:  CT, 05/24/2016 FINDINGS: Six submitted images show placement of a long screw crossing from the right ilium to the left  ilium across both SI joints. Images also show placement of a fixation plate and screws reducing the distracted symphysis pubis. Orthopedic hardware is well-seated. No acute fracture or evidence of an operative complication. IMPRESSION: Well-positioned orthopedic hardware for ORIF of pelvic fractures. Electronically Signed   By: Lajean Manes M.D.   On: 05/27/2016 10:46   Dg C-arm 1-60 Min  Result Date: 05/27/2016 CLINICAL DATA:  SI screw insertion And pelvic ring plate for ORIF, please note, screw insertion is on the PT RIGHT, images are sent to pacs, 48.2 seconds fluoro EXAM: [facility] PELVIS - 3+ VIEW; DG C-ARM 61-120 MIN COMPARISON:  CT, 05/24/2016 FINDINGS: Six submitted images show placement of a long screw crossing from the right ilium to the left ilium across both SI joints. Images also show placement of a fixation plate and screws reducing the distracted symphysis pubis. Orthopedic hardware is well-seated. No acute fracture or evidence of an operative complication. IMPRESSION: Well-positioned orthopedic hardware for ORIF of pelvic fractures. Electronically Signed   By: Lajean Manes M.D.   On: 05/27/2016 10:46     Assessment/Plan: Diagnosis: Multitrauma after fall with pelvic fracture, L3 endplate fracture and pelvic hemorrhage Labs and images independently reviewed.  Records reviewed and summated above.  1. Does the need for close, 24 hr/day medical supervision in concert with the patient's rehab needs make it unreasonable for this patient to be served in a less intensive setting? Yes 2. Co-Morbidities requiring supervision/potential complications: HTN (monitor and provide prns in accordance with increased physical exertion and pain), diabetes mellitus (Monitor in accordance with exercise and adjust meds as necessary), post-pain management (Biofeedback training with therapies to help reduce reliance on opiate pain medications, monitor pain control during therapies, and sedation at rest and  titrate to maximum efficacy to ensure participation and gains in therapies), Acute blood loss anemia (transfuse if necessary to ensure appropriate perfusion for increased activity tolerance), SIRS (fevers, tachycardia - cont to monitor for signs and symptoms of infection, further workup if indicated), Thrombocytopenia (< 60,000/mm3 no resistive exercise) 3. Due to bladder management, safety, skin/wound care, pain management and patient education, does the patient require 24 hr/day rehab nursing? Yes 4. Does the patient require coordinated care of a physician, rehab nurse, PT (1-2 hrs/day, 5 days/week) and OT (1-2 hrs/day, 5 days/week) to address physical and functional deficits in the context of the above medical diagnosis(es)? Yes Addressing deficits in the following areas: endurance, locomotion, transferring, bathing, dressing, toileting and psychological support 5. Can the patient actively participate in an intensive therapy program  of at least 3 hrs of therapy per day at least 5 days per week? Likely in the near future 6. The potential for patient to make measurable gains while on inpatient rehab is excellent 7. Anticipated functional outcomes upon discharge from inpatient rehab are Mod I at wheelchair level  with PT, Mod I at wheelchair level with OT, n/a with SLP. 8. Estimated rehab length of stay to reach the above functional goals is: 12-17 days. 9. Does the patient have adequate social supports and living environment to accommodate these discharge functional goals? Yes 10. Anticipated D/C setting: Home 11. Anticipated post D/C treatments: HH therapy and Home excercise program 12. Overall Rehab/Functional Prognosis: excellent  RECOMMENDATIONS: This patient's condition is appropriate for continued rehabilitative care in the following setting: CIR when able to tolerate 3 hours therapy/day and medically stable. Patient has agreed to participate in recommended program. Yes Note that insurance  prior authorization may be required for reimbursement for recommended care.  Comment: Rehab Admissions Coordinator to follow up.  Delice Lesch, MD 05/28/2016    Revision History                        Routing History

## 2016-05-30 NOTE — Progress Notes (Signed)
Rehab admissions - I met with patient this am.  He is agreeable to inpatient rehab here at [facility].  I await insurance authorization through [place].  I will update all once I hear back from insurance carrier.  Call me for questions.  #[phone]

## 2016-05-30 NOTE — Progress Notes (Signed)
Retta Diones, RN Rehab Admission Coordinator Signed Physical Medicine and Rehabilitation  PMR Pre-admission Date of Service: 05/30/2016 10:39 AM  Related encounter: ED to Hosp-Admission (Current) from 05/24/2016 in [place]       _0 Hide copied text PMR Admission Coordinator Pre-Admission Assessment  Patient: Corey Sellers is an 65 y.o., male MRN: [id number] DOB: Jan 17, 1951 Height: _1  (175.3 cm) Weight: 112 kg (247 lb)                                                                                                                                                                                                                                                                          Insurance Information HMO:      PPO: Yes     PCP:       IPA:       80/20:       OTHER: Group # [id number] PRIMARYLorella [place] of [place]     Policy#: [id number]      Subscriber: Cherlyn Labella CM Name: Carollee Herter      Phone#:       Fax#: [phone] Pre-Cert#: [id number] (please use rehab tool for updates) X 7 days with update due 06/05/16     Employer:  [employer] Benefits:  Phone #: [phone]     Name: Mannie Stabile. Date: 11/27/2008     Deduct: $375 (met $375)      Out of Pocket Max:  $2800 (met $1497.84)      Life Max:  Unlimited CIR: 90% w/auth      SNF: 90% w/auth 100 days max Outpatient: 90% with 60 days max per year    Co-Pay: 10% Home Health: 90% with 100 days max      Co-Pay: 10% DME: 90% and call [phone]     Co-Pay: 10% Providers: in network  Emergency [place]    Name Relation Home Work Mobile   Solberg,Cathy Spouse   [phone]   Murphy,April Daughter   [phone]     Current Medical History  Patient Admitting Diagnosis: Multitrauma after fall with pelvic fracture, L3 endplate fracture and pelvic hemorrhage  History of Present Illness:  A 65 y.o.right handed malewith history of hypertension, diabetes  mellitus. Per chart review patient lives with spouse independent prior to admission. 2 level home with bedroom first floor. Foursteps to entry.Wife works day shift.Presented 05/24/2016 after a fall 8-10 feet from scaffolding while building a [employer] home. Denied loss of consciousness. X-rays and imaging revealed pelvic diastasiswith pelvic hematoma, right sacral fracture, L3 superior endplate fracture. Status post angioembolization pelvic hemorrhage for interventional radiology. Underwent ORIF of anterior pubic symphysis, screw fixation right left application of small wound VAC 8/29/2017per Dr. Marcelino Scot. Weightbearing as tolerated left lower extremity for transfers only and nonweightbearing right lower extremity. Subcutaneous Lovenox for DVT prophylaxis. LSO brace placed for L3 endplate fracture per neurosurgery Dr. Sherley Bounds. Hospital course pain management. Acute blood loss anemia 8.2. Physical and occupational therapy evaluation completed 05/28/2016 with recommendations of physical medicine rehabilitation consult  Past Medical History      Past Medical History:  Diagnosis Date  . Diabetes mellitus   . GERD (gastroesophageal reflux disease)   . Hypertension   . 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 Wears glasses     Family History  family history is not on file.  Prior Rehab/Hospitalizations: No previous rehab.  Has the patient had major surgery during 100 days prior to admission? No  Current Medications   Current Facility-Administered Medications:  .  acetaminophen (TYLENOL) tablet 1,000 mg, 1,000 mg, Oral, Q6H PRN, Donnie Mesa, MD, 1,000 mg at 05/29/16 2134 .  irbesartan (AVAPRO) tablet 300 mg, 300 mg, Oral, Daily, 300 mg at 05/29/16 1024 **AND** amLODipine (NORVASC) tablet 10 mg, 10 mg, Oral, Daily, 10 mg at 05/30/16 0956 **AND** hydrochlorothiazide (HYDRODIURIL) tablet 25 mg, 25 mg, Oral, Daily, Lisette Abu, PA-C, 25 mg at 05/30/16 0954 .  aspirin EC tablet 81 mg, 81 mg, Oral, QPM, Lisette Abu, PA-C, 81 mg at 05/29/16 1572 .  bethanechol (URECHOLINE) tablet 25 mg, 25 mg, Oral, QID, Lisette Abu, PA-C, 25 mg at 05/30/16 0954 .  cholecalciferol (VITAMIN D) tablet 4,000 Units, 4,000 Units, Oral, QPM, Lisette Abu, PA-C, 4,000 Units at 05/29/16 1823 .  cyclobenzaprine (FLEXERIL) tablet 10 mg, 10 mg, Oral, TID PRN, Lisette Abu, PA-C, 10 mg at 05/29/16 1258 .  docusate sodium (COLACE) capsule 100 mg, 100 mg, Oral, BID, Mickeal Skinner, MD, 100 mg at 05/30/16 0954 .  enoxaparin (LOVENOX) injection 30 mg, 30 mg, Subcutaneous, Q12H, Lisette Abu, PA-C, 30 mg at 05/30/16 0953 .  insulin aspart (novoLOG) injection 0-15 Units, 0-15 Units, Subcutaneous, TID WC, Georganna Skeans, MD, 3 Units at 05/30/16 [phone] .  insulin aspart (novoLOG) injection 0-5 Units, 0-5 Units, Subcutaneous, QHS, Georganna Skeans, MD, 2 Units at 05/25/16 2217 .  linagliptin (TRADJENTA) tablet 5 mg, 5 mg, Oral, Q breakfast, Lisette Abu, PA-C, 5 mg at 05/30/16 0954 .  loratadine (CLARITIN) tablet 10 mg, 10 mg, Oral, Daily, Lisette Abu, PA-C, 10 mg at 05/30/16 0956 .  metFORMIN (GLUCOPHAGE-XR) 24 hr tablet 1,000 mg, 1,000 mg, Oral, Q supper, Lisette Abu, PA-C, 1,000 mg at 05/29/16 5597 .  morphine 2 MG/ML injection 2 mg, 2 mg, Intravenous, Q4H PRN, Lisette Abu, PA-C, 2 mg at 05/28/16 1002 .  ondansetron (ZOFRAN) tablet 4 mg, 4 mg, Oral, Q6H PRN **OR** ondansetron (ZOFRAN) injection 4 mg, 4 mg, Intravenous, Q6H PRN, Arta Bruce Kinsinger, MD .  oxyCODONE (Oxy IR/ROXICODONE) immediate release tablet  5-15 mg, 5-15 mg, Oral, Q4H PRN, Lisette Abu, PA-C, 10 mg at 05/29/16 2133 .  pantoprazole (PROTONIX) EC tablet 40 mg, 40 mg, Oral, Daily, Lisette Abu, PA-C, 40 mg at 05/30/16 0956 .  phenol (CHLORASEPTIC) mouth spray 1 spray, 1 spray, Mouth/Throat, PRN, Mickeal Skinner, MD, 1  spray at 05/24/16 2113 .  polyethylene glycol (MIRALAX / GLYCOLAX) packet 17 g, 17 g, Oral, Daily, Lisette Abu, PA-C, 17 g at 05/30/16 0959 .  pregabalin (LYRICA) capsule 75 mg, 75 mg, Oral, BID, Ainsley Spinner, PA-C .  rosuvastatin (CRESTOR) tablet 20 mg, 20 mg, Oral, QPM, Lisette Abu, PA-C, 20 mg at 05/29/16 1823 .  tamsulosin (FLOMAX) capsule 0.8 mg, 0.8 mg, Oral, Daily, Lisette Abu, PA-C, 0.8 mg at 05/30/16 5409  Patients Current Diet: Diet Carb Modified Fluid consistency: Thin; Room service appropriate? Yes  Precautions / Restrictions Precautions Precautions: Back, Fall, Other (comment) Precaution Booklet Issued: No Precaution Comments: instructed in log roll technique, care taken with scrotum with mobility Spinal Brace: Thoracolumbosacral orthotic, Applied in supine position Restrictions Weight Bearing Restrictions: Yes RLE Weight Bearing: Non weight bearing LLE Weight Bearing: Weight bearing as tolerated Other Position/Activity Restrictions: Pt could maintain NWB on RLE with 2 person assist   Has the patient had 2 or more falls or a fall with injury in the past year?No.  Has only had one fall which resulted in current acute hospitalization and current injuries.  Prior Activity Level Community (5-7x/wk): Went out daily.  worked Therapist, occupational / Paramedic Devices/Equipment: None Home Equipment: None  Prior Device Use: Indicate devices/aids used by the patient prior to current illness, exacerbation or injury? None  Prior Functional Level Prior Function Level of Independence: Independent Comments: 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  Self Care: Did the patient need help bathing, dressing, using the toilet or eating?  Independent  Indoor Mobility: Did the patient need assistance with walking from room to room (with or without device)? Independent  Stairs: Did the patient need assistance with  internal or external stairs (with or without device)? Independent  Functional Cognition: Did the patient need help planning regular tasks such as shopping or remembering to take medications? Independent  Current Functional Level Cognition Overall Cognitive Status: Within Functional Limits for tasks assessed Orientation Level: Oriented X4    Extremity Assessment (includes Sensation/Coordination) Upper Extremity Assessment: Overall WFL for tasks assessed  Lower Extremity Assessment: RLE deficits/detail RLE Deficits / Details: pain to move RLE and did not formally mm test, NWB RLE: Unable to fully assess due to pain RLE Coordination: decreased gross motor, decreased fine motor   ADLs Overall ADL's : Needs assistance/impaired Eating/Feeding: Independent, Bed level Grooming: Wash/dry hands, Wash/dry face, Bed level, Set up Upper Body Bathing: Maximal assistance, Bed level Lower Body Bathing: Total assistance, Bed level Upper Body Dressing : Maximal assistance, Bed level Lower Body Dressing: Total assistance, Bed level   Mobility Overal bed mobility: Needs Assistance, +2 for physical assistance, + 2 for safety/equipment Bed Mobility: Rolling, Sidelying to Sit, Sit to Sidelying Rolling: Mod assist, +2 for physical assistance, +2 for safety/equipment Sidelying to sit: Mod assist, +2 for physical assistance, +2 for safety/equipment Sit to sidelying: Mod assist, +2 for physical assistance, +2 for safety/equipment General bed mobility comments: rolled to apply and remove brace, used log roll technique assisting with LEs and trunk to sit EOB, used pillow between knees to protect scrotum and assisted hips with  bed pad   Transfers Overall transfer level: Needs assistance Equipment used: Rolling walker (2 wheeled) Transfers: Sit to/from Stand Sit to Stand: Mod assist, +2 physical assistance, +2 safety/equipment General transfer comment: pt required increased time and mod A to achieve full standing  position; pt able to maintain NWB R LE status throughout   Ambulation / Gait / Stairs / Wheelchair Mobility Ambulation/Gait General Gait Details: pt attempted to advance L LE, however, unable to do so secondary to pain and pt reporting the L LE was too weak to move   Posture / Balance Dynamic Sitting Balance Sitting balance - Comments: pt able to sustain upright sitting with bilateral UE supports and min guard Balance Overall balance assessment: Needs assistance Sitting-balance support: Feet supported, Bilateral upper extremity supported Sitting balance-Leahy Scale: Poor Sitting balance - Comments: pt able to sustain upright sitting with bilateral UE supports and min guard Standing balance support: During functional activity, Bilateral upper extremity supported Standing balance-Leahy Scale: Poor Standing balance comment: pt able to stand with NWB R LE using RW and mod A x2   Special needs/care consideration BiPAP/CPAP No CPM No Continuous Drip IV No Dialysis No        Life Vest No Oxygen No Special Bed No Trach Size No Wound Vac (area) Yes      Location: mid abdomen Skin Right hip dressing, VAc mid abdomen, scrotum enlarged, thin skin, bruising                    Bowel mgmt: Last BM 05/28/16 per chart, however, patient report no BM in the past 4-5 days Bladder mgmt: Urinary retention with in and out catheterization post removal of foley catheter Diabetic mgmt Yes, on oral medication at home.   Previous Home Environment Living Arrangements: Spouse/significant other Available Help at Discharge: Family, Available PRN/intermittently Type of Home: House Home Layout: Two level, Able to live on main level with bedroom/bathroom Home Access: Stairs to enter Entrance Stairs-Rails: None Entrance Stairs-Number of Steps: 4 down Biochemist, clinical: [place]: No  Discharge Living Setting Plans for Discharge Living Setting: Patient's home, House, Lives with (comment) (Lives  with wife.) Type of Home at Discharge: House Discharge Home Layout: Two level, Bed/bath upstairs, Able to live on main level with bedroom/bathroom Alternate Level Stairs-Number of Steps: 13 Discharge Home Access: Stairs to enter Entrance Stairs-Number of Steps: 3 steps down into home Does the patient have any problems obtaining your medications?: Yes (Describe)  Social/Family/Support Systems Patient Roles: Spouse, Parent (Has a wife and a daughter locally.) Contact Information: Jhamir Pickup - wife Anticipated Caregiver: wife and daughter Anticipated Caregiver's Contact Information: Tye [place] - wife [phone] Ability/Limitations of Caregiver: Dtr 5 mins away and can check on patient.  Wife works 9 am to 6 pm [employer] Caregiver Availability: Intermittent Discharge Plan Discussed with Primary Caregiver: Yes Is Caregiver In Agreement with Plan?: Yes Does Caregiver/Family have Issues with Lodging/Transportation while Pt is in Rehab?: No  Goals/Additional Needs Patient/Family Goal for Rehab: PT/OT mod I W/C level goals Expected length of stay: 12-17 days Cultural Considerations: None Dietary Needs: Carb moc, med cal, thin liquids Equipment Needs: TBD Pt/Family Agrees to Admission and willing to participate: Yes Program Orientation Provided & Reviewed with Pt/Caregiver Including Roles  & Responsibilities: Yes  Decrease burden of Care through IP rehab admission: N/A  Possible need for SNF placement upon discharge: Not anticipated  Patient Condition: This patient's medical and functional status has changed since the consult dated: 05/28/16  in which the Rehabilitation Physician determined and documented that the patient's condition is appropriate for intensive rehabilitative care in an inpatient rehabilitation facility. See "History of Present Illness" (above) for medical update. Functional changes are: Currently requiring mod assist for transfers. Patient's medical and functional status  update has been discussed with the Rehabilitation physician and patient remains appropriate for inpatient rehabilitation. Will admit to inpatient rehab today.   Preadmission Screen Completed By:  Retta Diones, 05/30/2016 11:43 AM ______________________________________________________________________   Discussed status with Dr. Naaman Plummer on 05/30/16 at 1143 and received telephone approval for admission today.  Admission Coordinator:  Retta Diones, time1143/Date09/01/17       Cosigned by: Meredith Staggers, MD at 05/30/2016 12:34 PM  Revision History

## 2016-05-30 NOTE — Progress Notes (Signed)
Physical Therapy Treatment Patient Details Name: Corey Sellers MRN: [id number] DOB: [birth year] Today's Date: 05/30/2016    History of Present Illness pt admitted after falling from scaffolding while building a [employer], resulting in pelvic fx, L3 end plate fx and pelvic hemorrhage. s/p symphysis pubis, transsacral screw and application of wound vac. Pt with significant scrotal swelling.    PT Comments    Pt presented supine in bed with HOB elevated, awake and willing to participate in therapy session. Pt performed SPT to recliner chair; however, pt was unable to sustain NWB R LE throughout transfer. Pt did not appear to be utilizing his UEs on RW to bear any weight through his UEs. Pt still very sensitive at his scrotum and positioning. Pt would continue to benefit from skilled physical therapy services at this time while admitted and after d/c to address his limitations in order to improve his overall safety and independence with functional mobility.   Follow Up Recommendations  CIR     Equipment Recommendations  None recommended by PT;Other (comment) (defer to next venue)    Recommendations for Other Services Rehab consult     Precautions / Restrictions Precautions Precautions: Back;Fall;Other (comment) Precaution Booklet Issued: No Precaution Comments: instructed in log roll technique, care taken with scrotum with mobility Required Braces or Orthoses: Spinal Brace Spinal Brace: Thoracolumbosacral orthotic;Applied in supine position Restrictions Weight Bearing Restrictions: Yes RLE Weight Bearing: Non weight bearing LLE Weight Bearing: Weight bearing as tolerated    Mobility  Bed Mobility Overal bed mobility: Needs Assistance;+2 for physical assistance;+ 2 for safety/equipment Bed Mobility: Rolling;Sidelying to Sit Rolling: Min assist Sidelying to sit: Mod assist;+2 for physical assistance;+2 for safety/equipment       General bed mobility comments: Pt  required assist with donning TLSO but was able to roll with min A  Transfers Overall transfer level: Needs assistance Equipment used: Rolling walker (2 wheeled) Transfers: Sit to/from [facility] Sit to Stand: Mod assist;+2 physical assistance;+2 safety/equipment Stand pivot transfers: Mod assist;+2 physical assistance;+2 safety/equipment       General transfer comment: pt required increased time and mod A to achieve full standing position; pt unable to maintain NWB R LE status throughout SPT even with VC'ing from therapists  Ambulation/Gait                 Stairs            Wheelchair Mobility    Modified Rankin (Stroke Patients Only)       Balance Overall balance assessment: Needs assistance Sitting-balance support: Feet supported;Bilateral upper extremity supported Sitting balance-Leahy Scale: Poor Sitting balance - Comments: pt able to sustain upright sitting with bilateral UE supports and min guard   Standing balance support: During functional activity;Bilateral upper extremity supported Standing balance-Leahy Scale: Poor Standing balance comment: In static standing, pt amble to maintain NWB R LE                    Cognition Arousal/Alertness: Awake/alert Behavior During Therapy: WFL for tasks assessed/performed Overall Cognitive Status: Within Functional Limits for tasks assessed                      Exercises      General Comments        Pertinent Vitals/Pain Pain Assessment: 0-10 Pain Score: 2  Pain Location: back, scrotum Pain Descriptors / Indicators: Discomfort;Grimacing;Guarding Pain Intervention(s): Monitored during session;Repositioned    Home Living  Prior Function            PT Goals (current goals can now be found in the care plan section) Acute Rehab PT Goals Patient Stated Goal: decrease pain PT Goal Formulation: With patient Time For Goal Achievement:  06/11/16 Potential to Achieve Goals: Good Progress towards PT goals: Progressing toward goals    Frequency  Min 3X/week    PT Plan Current plan remains appropriate    Co-evaluation PT/OT/SLP Co-Evaluation/Treatment: Yes Reason for Co-Treatment: For patient/therapist safety PT goals addressed during session: Mobility/safety with mobility;Balance;Proper use of DME;Strengthening/ROM       End of Session Equipment Utilized During Treatment: Gait belt;Other (comment) (TLSO) Activity Tolerance: Patient limited by pain Patient left: in chair;with call bell/phone within reach     Time: 1221-1246 PT Time Calculation (min) (ACUTE ONLY): 25 min  Charges:  $Therapeutic Activity: 8-22 mins                    G CodesClearnce Sorrel Juliannah Ohmann 2016-06-14, 1:31 PM Sherie Don, [place], DPT [phone]

## 2016-05-30 NOTE — H&P (Signed)
Physical Medicine and Rehabilitation Admission H&P    Chief Complaint  Patient presents with  . Pelvic fracture with hematoma and L3 fracture    HPI:   Corey Sellers is an 65 y.o. white male with history of T2DM, HTN, melanoma who sustained a fall off 8 feet off a scaffolding (working on [employer]) on 05/24/2016 onto his back and had immediate onset of back and pelvic pain with inability to walk. 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 He was taken to IR emergently for iliac artery embolization. Dr. Ronnald Ramp recommended LSO for L3 fracture--to be donned when supine and follow up repeat films in 2 weeks to monitor for stability. Dr. Marcelino Scot consulted for input due to complexity of pelvic fracture and patient underwent ORIF anterior pubic symphysis with transsacral screw fixation right and left.  NWB RLE for 8 weeks and suture removal in 2 weeks.   He has had significant scrotal edema with difficulty voiding--urecholine and flomax added to help with symptoms. Has had abdominal distension with constipation, fevers as well as ABLA. Therapy ongoing and CIR recommended for follow up therapy.    Review of Systems  HENT: Negative for hearing loss.   Eyes: Negative for blurred vision and double vision.  Respiratory: Negative for cough and shortness of breath.   Cardiovascular: Negative for chest pain and palpitations.  Gastrointestinal: Positive for constipation. Negative for heartburn, nausea and vomiting.  Genitourinary: Negative for dysuria and urgency.       Problems voiding--  Musculoskeletal: Positive for back pain and myalgias.  Neurological: Positive for sensory change (bilateral hands in am due to positional changes. ). Negative for dizziness, tingling and headaches.  Psychiatric/Behavioral: The patient is not  nervous/anxious and does not have insomnia.       Past Medical History:  Diagnosis Date  . Diabetes mellitus   . GERD (gastroesophageal reflux disease)   . Hypertension   . 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 Wears glasses     Past Surgical History:  Procedure Laterality Date  . APPENDECTOMY    . BASAL CELL CARCINOMA EXCISION     left chin  . EXCISION METACARPAL MASS Right 05/23/2013   Procedure: RIGHT LONG EXCISION ANNULAR LIGAMENT CYST;  Surgeon: Tennis Must, MD;  Location: [place];  Service: Orthopedics;  Laterality: Right;  . GANGLION CYST EXCISION Left 06/06/2013   Procedure: LEFT WRIST EXCISION MASS;  Surgeon: Tennis Must, MD;  Location: [place];  Service: Orthopedics;  Laterality: Left;  . IR GENERIC HISTORICAL  05/24/2016   IR ANGIOGRAM PELVIS SELECTIVE OR SUPRASELECTIVE 05/24/2016 Arne Cleveland, MD [facility]  . IR GENERIC HISTORICAL  05/24/2016   IR US GUIDE VASC ACCESS RIGHT 05/24/2016 Arne Cleveland, MD [facility]  . IR GENERIC HISTORICAL  05/24/2016   IR EMBO ART  VEN HEMORR LYMPH EXTRAV  INC GUIDE ROADMAPPING 05/24/2016 Arne Cleveland, MD [facility]  . JOINT REPLACEMENT  2003,2004   Rt shoulder  . ORIF PELVIC FRACTURE N/A 05/27/2016   Procedure: OPEN REDUCTION INTERNAL FIXATION (ORIF) SYMPHYSIS PUBIS W/ TRANSSACRAL SCREWS;  Surgeon: Altamese Wallington, MD;  Location: [place];  Service: Orthopedics;  Laterality: N/A;  . PAROTIDECTOMY W/ NECK DISSECTION TOTAL  2013   left parotidectomy-26 nodes removed  .  TONSILLECTOMY    . TRIGGER FINGER RELEASE Right 05/23/2013   Procedure: RELEASE TRIGGER FINGER/A-1 PULLEY RIGHT INDEX AND LONG;  Surgeon: Tennis Must, MD;  Location: [place];  Service: Orthopedics;  Laterality: Right;  . TRIGGER FINGER RELEASE Left 06/06/2013   Procedure: RELEASE TRIGGER FINGER/A-1 PULLEY LEFT SMALL;  Surgeon: Tennis Must, MD;  Location: [place];  Service: Orthopedics;  Laterality: Left;  Marland Kitchen VARICOCELE EXCISION     left    Family History  Problem Relation Age of Onset  . High Cholesterol Mother   . Mesothelioma Father       Social History:  Married. Works as Financial trader. He reports that he quit smoking about 17 years ago. He has never used smokeless tobacco. He reports that he drinks alcohol--beer and wine. He reports that he does not use drugs.    Allergies  Allergen Reactions  . Bee Venom Anaphylaxis  . Other Rash and Other (See Comments)    METALS. Patient has metal sensitivity, including metal sutures.  Causes severe irritation. Metal sensitivity - redness and irritation at site when staples used in incision closure    Medications Prior to Admission  Medication Sig Dispense Refill  . aspirin EC 81 MG tablet Take 81 mg by mouth every evening.    . cetirizine (ZYRTEC) 10 MG tablet Take 10 mg by mouth every evening.     . Cholecalciferol 4000 units CAPS Take 1 capsule by mouth every evening.    . cyclobenzaprine (FLEXERIL) 10 MG tablet Take 10 mg by mouth 3 (three) times daily as needed.    Marland Kitchen EPINEPHrine 0.3 mg/0.3 mL IJ SOAJ injection Inject 0.3 mg into the muscle once as needed.    . etodolac (LODINE XL) 400 MG 24 hr tablet Take 800 mg by mouth every evening.    . metFORMIN (GLUCOPHAGE-XR) 500 MG 24 hr tablet Take 1,000 mg by mouth every evening.    . Multiple Vitamin (MULTIVITAMIN WITH MINERALS) TABS Take 1 tablet by mouth daily.    . Olmesartan-Amlodipine-HCTZ (TRIBENZOR) 40-10-25 MG TABS Take 1 tablet by mouth every evening.    Marland Kitchen omeprazole (PRILOSEC) 20 MG capsule Take 20 mg by mouth daily.    . rosuvastatin (CRESTOR) 40 MG tablet Take 20 mg by mouth every evening.    . sitaGLIPtin (JANUVIA) 100 MG tablet Take 100 mg by mouth every evening.      Home: Home Living Family/patient expects to be discharged to:: Private residence Living Arrangements: Spouse/significant  other Available Help at Discharge: Family, Available PRN/intermittently Type of Home: House Home Access: Stairs to enter [employer] of Steps: 4 down Entrance Stairs-Rails: None Home Layout: Two level, Able to live on main level with bedroom/bathroom Bathroom Toilet: Standard Home Equipment: None   Functional History: Prior Function Level of Independence: Independent Comments: 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  Functional Status:  Mobility: Bed Mobility Overal bed mobility: Needs Assistance, +2 for physical assistance, + 2 for safety/equipment Bed Mobility: Rolling, Sidelying to Sit, Sit to Sidelying Rolling: Mod assist, +2 for physical assistance, +2 for safety/equipment Sidelying to sit: Mod assist, +2 for physical assistance, +2 for safety/equipment Sit to sidelying: Mod assist, +2 for physical assistance, +2 for safety/equipment General bed mobility comments: rolled to apply and remove brace, used log roll technique assisting with LEs and trunk to sit EOB, used pillow between knees to protect scrotum and assisted hips with bed pad Transfers Overall transfer  level: Needs assistance Equipment used: Rolling walker (2 wheeled) Transfers: Sit to/from Stand Sit to Stand: Mod assist, +2 physical assistance, +2 safety/equipment General transfer comment: pt required increased time and mod A to achieve full standing position; pt able to maintain NWB R LE status throughout Ambulation/Gait General Gait Details: pt attempted to advance L LE, however, unable to do so secondary to pain and pt reporting the L LE was too weak to move    ADL: ADL Overall ADL's : Needs assistance/impaired Eating/Feeding: Independent, Bed level Grooming: Wash/dry hands, Wash/dry face, Bed level, Set up Upper Body Bathing: Maximal assistance, Bed level Lower Body Bathing: Total assistance, Bed level Upper Body Dressing : Maximal assistance, Bed level Lower Body Dressing: Total  assistance, Bed level  Cognition: Cognition Overall Cognitive Status: Within Functional Limits for tasks assessed Orientation Level: Oriented X4 Cognition Arousal/Alertness: Awake/alert Behavior During Therapy: WFL for tasks assessed/performed Overall Cognitive Status: Within Functional Limits for tasks assessed     Blood pressure (!) 139/57, pulse 100, temperature 100.1 F (37.8 C), temperature source Oral, resp. rate 16, height 5\' 9"  (1.753 m), weight 112 kg (247 lb), SpO2 98 %. Physical Exam  Nursing note and vitals reviewed. Constitutional: He is oriented to person, place, and time. He appears well-developed and well-nourished. Nasal cannula in place.  Up in chair reporting back discomfort.   HENT:  Head: Normocephalic and atraumatic.  Right Ear: External ear normal.  Left Ear: External ear normal.  Mouth/Throat: Oropharynx is clear and moist.  Eyes: Conjunctivae are normal. Pupils are equal, round, and reactive to light. Right eye exhibits no discharge. Left eye exhibits no discharge. No scleral icterus.  Neck: Normal range of motion. Neck supple.  Cardiovascular: Normal rate and regular rhythm.  Exam reveals no gallop and no friction rub.   No murmur heard. Respiratory: Effort normal and breath sounds normal. No stridor. No respiratory distress. He has no wheezes.  GI: Normal appearance and bowel sounds are normal. He exhibits distension. There is no tenderness.  Lower abdominal wound with dry surgical dressing in place.  Genitourinary: Penile tenderness present.  Genitourinary Comments: Scrotum edematous with diffuse ecchymosis?   Musculoskeletal: He exhibits edema. He exhibits no tenderness.  Neurological: He is alert and oriented to person, place, and time.  Speech clear. Able to follow basic commands without difficulty. UE strength grossly 5/5. LE: 2-/5HF , 2+ KE and 4/5 ADF/PF. No sensory changes. Cognitively appears intact  Skin: Skin is warm and dry. There is  erythema.  Hypogastric incision intact with sutures, no discharge  Psychiatric: He has a normal mood and affect. His behavior is normal. Thought content normal.    Results for orders placed or performed during the hospital encounter of 05/24/16 (from the past 48 hour(s))  Glucose, capillary     Status: Abnormal   Collection Time: 05/28/16  4:35 PM  Result Value Ref Range   Glucose-Capillary 132 (H) 65 - 99 mg/dL   Comment 1 Notify RN   Glucose, capillary     Status: Abnormal   Collection Time: 05/28/16  9:40 PM  Result Value Ref Range   Glucose-Capillary 159 (H) 65 - 99 mg/dL  CBC     Status: Abnormal   Collection Time: 05/29/16  3:43 AM  Result Value Ref Range   WBC 7.2 4.0 - 10.5 K/uL   RBC 2.76 (L) 4.22 - 5.81 MIL/uL   Hemoglobin 8.0 (L) 13.0 - 17.0 g/dL   HCT 25.0 (L) 39.0 - 52.0 %  MCV 90.6 78.0 - 100.0 fL   MCH 29.0 26.0 - 34.0 pg   MCHC 32.0 30.0 - 36.0 g/dL   RDW 13.8 11.5 - 15.5 %   Platelets 148 (L) 150 - 400 K/uL  Glucose, capillary     Status: Abnormal   Collection Time: 05/29/16  6:49 AM  Result Value Ref Range   Glucose-Capillary 160 (H) 65 - 99 mg/dL  Glucose, capillary     Status: Abnormal   Collection Time: 05/29/16 11:45 AM  Result Value Ref Range   Glucose-Capillary 161 (H) 65 - 99 mg/dL  Glucose, capillary     Status: Abnormal   Collection Time: 05/29/16  4:29 PM  Result Value Ref Range   Glucose-Capillary 191 (H) 65 - 99 mg/dL   Comment 1 Notify RN   Urinalysis, Routine w reflex microscopic (not at [facility])     Status: None   Collection Time: 05/29/16  6:20 PM  Result Value Ref Range   Color, Urine YELLOW YELLOW   APPearance CLEAR CLEAR   Specific Gravity, Urine 1.014 1.005 - 1.030   pH 5.5 5.0 - 8.0   Glucose, UA NEGATIVE NEGATIVE mg/dL   Hgb urine dipstick NEGATIVE NEGATIVE   Bilirubin Urine NEGATIVE NEGATIVE   Ketones, ur NEGATIVE NEGATIVE mg/dL   Protein, ur NEGATIVE NEGATIVE mg/dL   Nitrite NEGATIVE NEGATIVE   Leukocytes, UA NEGATIVE  NEGATIVE    Comment: MICROSCOPIC NOT DONE ON URINES WITH NEGATIVE PROTEIN, BLOOD, LEUKOCYTES, NITRITE, OR GLUCOSE <1000 mg/dL.  Glucose, capillary     Status: Abnormal   Collection Time: 05/29/16  9:24 PM  Result Value Ref Range   Glucose-Capillary 169 (H) 65 - 99 mg/dL  Glucose, capillary     Status: Abnormal   Collection Time: 05/30/16  6:33 AM  Result Value Ref Range   Glucose-Capillary 164 (H) 65 - 99 mg/dL  CBC     Status: Abnormal   Collection Time: 05/30/16 10:10 AM  Result Value Ref Range   WBC 6.9 4.0 - 10.5 K/uL   RBC 2.73 (L) 4.22 - 5.81 MIL/uL   Hemoglobin 7.8 (L) 13.0 - 17.0 g/dL   HCT 24.6 (L) 39.0 - 52.0 %   MCV 90.1 78.0 - 100.0 fL   MCH 28.6 26.0 - 34.0 pg   MCHC 31.7 30.0 - 36.0 g/dL   RDW 13.5 11.5 - 15.5 %   Platelets 180 150 - 400 K/uL  Glucose, capillary     Status: Abnormal   Collection Time: 05/30/16 11:34 AM  Result Value Ref Range   Glucose-Capillary 181 (H) 65 - 99 mg/dL   Dg Chest 2 View  Result Date: 05/29/2016 CLINICAL DATA:  RIGHT-sided chest pain and fever for 2 days after surgery. History of hypertension and diabetes. EXAM: CHEST  2 VIEW COMPARISON:  Chest radiograph May 24, 2016 FINDINGS: Cardiomediastinal silhouette is unremarkable for this low inspiratory portable examination with crowded vasculature markings. The lungs are clear without pleural effusions or focal consolidations. Trachea projects midline and there is no pneumothorax. Included soft tissue planes and osseous structures are non-suspicious. Postoperative changes distal RIGHT clavicle. IMPRESSION: No active cardiopulmonary disease. Electronically Signed   By: Elon Alas M.D.   On: 05/29/2016 17:50       Medical Problem List and Plan: 1.  Mobility and functional deficits secondary to polytrauma as above  -begin CIR 2.  DVT Prophylaxis/Anticoagulation: Pharmaceutical: Lovenox 3. Pain Management: Oxycodone prn effective.  4. Mood: LCSW to follow for evaluation and support.  5. Neuropsych: This patient is capable of making decisions on his own behalf. 6. Skin/Wound Care: Monitor wound of healing.  7. Fluids/Electrolytes/Nutrition: Monitor I/O. Check lytes in am.  8. L3 endplate fracture: Back precautions --don LSO when patient supine 9. ABLA: Serial CBC to monitor H/H  10. Fevers: Encourage IS--will check UA/UCS  -start patient on bowel program for constipation  -check dopplers 11.  Urinary retention:  Likely due to pelvic hematoma, immobility, constipation. Continue to  voiding with PVR checks. Continue urecholine and flomax. Rule out UTI as cause of infection.  12. T2DM: Monitor BS ac/hs. On metformin--use SSI for elevated BS.  13. HTN: Will monitor BP bid--continue avapro, norvasc and HCTZ.  14. Constipation: Will schedule miralax bid.     Post Admission Physician Evaluation: 1. Functional deficits secondary  to polytrauma. 2. Patient is admitted to receive collaborative, interdisciplinary care between the physiatrist, rehab nursing staff, and therapy team. 3. Patient's level of medical complexity and substantial therapy needs in context of that medical necessity cannot be provided at a lesser intensity of care such as a SNF. 4. Patient has experienced substantial functional loss from his/her baseline which was documented above under the "Functional History" and "Functional Status" headings.  Judging by the patient's diagnosis, physical exam, and functional history, the patient has potential for functional progress which will result in measurable gains while on inpatient rehab.  These gains will be of substantial and practical use upon discharge  in facilitating mobility and self-care at the household level. 5. Physiatrist will provide 24 hour management of medical needs as well as oversight of the therapy plan/treatment and provide guidance as appropriate regarding the interaction of the two. 6. 24 hour rehab nursing will assist with bladder management, bowel  management, safety, skin/wound care, disease management, medication administration, pain management and patient education  and help integrate therapy concepts, techniques,education, etc. 7. PT will assess and treat for/with: Lower extremity strength, range of motion, stamina, balance, functional mobility, safety, adaptive techniques and equipment, pain mgt, ortho precautions, ego support.   Goals are: mod I. 8. OT will assess and treat for/with: ADL's, functional mobility, safety, upper extremity strength, adaptive techniques and equipment, ortho precautions, pain mgt, ego support, community reintegration.   Goals are: mod I. Therapy may proceed with showering this patient. 9. SLP will assess and treat for/with: n/a.  Goals are: n/a. 10. Case Management and Social Worker will assess and treat for psychological issues and discharge planning. 11. Team conference will be held weekly to assess progress toward goals and to determine barriers to discharge. 12. Patient will receive at least 3 hours of therapy per day at least 5 days per week. 13. ELOS: 9-13 days       14. Prognosis:  excellent     Meredith Staggers, MD, [facility] 05/30/2016  05/30/2016

## 2016-05-30 NOTE — Progress Notes (Signed)
Patient ID: Corey Sellers, male   DOB: 07/01/1951, 65 y.o.   MRN: [id number]  [facility]  3 Days Post-Op  Subjective: No new complaints. Voiding small amounts on own, but still requiring I&O cath. Continues to spike fevers, TMAX 101 last night. 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  Denies CP, SOB, or cough. No BM but is passing flatus.  Objective: Vital signs in last 24 hours: Temp:  [99.1 F (37.3 C)-102.1 F (38.9 C)] 100.1 F (37.8 C) (09/01 0557) Pulse Rate:  [68-114] 100 (09/01 0557) Resp:  [16-18] 16 (09/01 0557) BP: (127-151)/(43-85) 139/57 (09/01 0557) SpO2:  [95 %-98 %] 98 % (09/01 0557) Last BM Date: 05/28/16  Intake/Output from previous day: 08/31 0701 - 09/01 0700 In: 960 [P.O.:960] Out: 4350 [Urine:4350] Intake/Output this shift: No intake/output data recorded.  PE: General appearance: alert and no distress Resp: clear to auscultation bilaterally Cardio: Tachycardia GI: normal findings: bowel sounds normal and soft, non-tender Extremities: NVI GU: scrotal swelling   Lab Results:   Recent Labs  05/28/16 0459 05/29/16 0343  WBC 7.8 7.2  HGB 8.2* 8.0*  HCT 25.7* 25.0*  PLT 133* 148*   BMET No results for input(s): NA, K, CL, CO2, GLUCOSE, BUN, CREATININE, CALCIUM in the last 72 hours. PT/INR No results for input(s): LABPROT, INR in the last 72 hours. CMP     Component Value Date/Time   NA 135 05/27/2016 0311   K 4.3 05/27/2016 0311   CL 101 05/27/2016 0311   CO2 27 05/27/2016 0311   GLUCOSE 177 (H) 05/27/2016 0311   BUN 10 05/27/2016 0311   CREATININE 0.79 05/27/2016 0311   CALCIUM 8.4 (L) 05/27/2016 0311   PROT 6.5 05/27/2016 0311   ALBUMIN 3.3 (L) 05/27/2016 0311   AST 24  05/27/2016 0311   ALT 39 05/27/2016 0311   ALKPHOS 48 05/27/2016 0311   BILITOT 0.8 05/27/2016 0311   GFRNONAA >60 05/27/2016 0311   GFRAA >60 05/27/2016 0311   Lipase  No results found for: LIPASE     Studies/Results: Dg Chest 2 View  Result Date: 05/29/2016 CLINICAL DATA:  RIGHT-sided chest pain and fever for 2 days after surgery. History of hypertension and diabetes. EXAM: CHEST  2 VIEW COMPARISON:  Chest radiograph May 24, 2016 FINDINGS: Cardiomediastinal silhouette is unremarkable for this low inspiratory portable examination with crowded vasculature markings. The lungs are clear without pleural effusions or focal consolidations. Trachea projects midline and there is no pneumothorax. Included soft tissue planes and osseous structures are non-suspicious. Postoperative changes distal RIGHT clavicle. IMPRESSION: No active cardiopulmonary disease. Electronically Signed   By: Awilda Metroourtnay  Bloomer M.D.   On: 05/29/2016 17:50    Anti-infectives: Anti-infectives    Start     Dose/Rate Route Frequency Ordered Stop   05/27/16 1700  ceFAZolin (ANCEF) IVPB 2g/100 mL premix     2 g 200 mL/hr over 30 Minutes Intravenous Every 8 hours 05/27/16 1243 05/28/16 0648   05/27/16 0800  ceFAZolin (ANCEF) IVPB 2g/100 mL premix     2 g 200 mL/hr over 30 Minutes Intravenous  Once 05/26/16 1351 05/27/16 0840       Assessment/Plan Fall L3 endplate FX - LSO per Dr. Yetta BarreJones, will need follow-up with him 2 weeks after  discharge from the hospital for repeat x-rays Open book pelvic FX with sacral FX s/p ORIF- per CoreyHandy, WBAT on LLE for transfers only, NWB RLE S/P angioembolization pelvic hemorrhage ABL anemia- Hgb stable 8.0 yesterday. Will recheck today. Urinary retention-- continue Flomax and urecholine. Patient voiding small amounts on own. If not improving will likely replace foley catheter tomorrow. Scrotal swelling likely dependent from pelvic surgery. Multiple medical problems-- Home  meds FEN- carb mod VTE- SCD's, Lovenox DIspo- CIR when bed available. Continue therapies. Will continue to monitor temperatures and cause of fever; u/a and CXR negative. Do not recommend chest CT at this time.   LOS: 6 days    Jerrye Beavers , [facility] 05/30/2016, 9:12 AM Pager: [phone] Consults: [phone] Mon-Fri 7:00 am-4:30 pm Sat-Sun 7:00 am-11:30 am

## 2016-05-30 NOTE — Discharge Summary (Signed)
[facility] Discharge Summary   Patient ID: Corey Sellers MRN: [id number] DOB/AGE: June 18, 1951 65 y.o.  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   Discharge Diagnosis Patient Active Problem List   Diagnosis Date Noted  . Fall 05/28/2016  . Multiple pelvic fractures ([place]) 05/28/2016  . Acute blood loss anemia 05/28/2016  . L3 vertebral fracture ([place]) 05/28/2016  . Urinary retention 05/28/2016  . Pelvic ring fracture ([place])   . Surgery, elective   . Benign essential HTN   . Diabetes mellitus type 2 without retinopathy ([place])   . Post-operative pain   . SIRS (systemic inflammatory response syndrome) (H[facility])   . Thrombocytopenia ([place])   . Pelvic hematoma in male 05/24/2016    Consultants Corey Lesch Sellers, PMR Corey Shown Sellers, ortho Corey Bounds Sellers, neurosurgery Imaging: Dg Chest 2 View  Result Date: 05/29/2016 CLINICAL DATA:  RIGHT-sided chest pain and fever for 2 days after surgery. History of hypertension and diabetes. EXAM: CHEST  2 VIEW COMPARISON:  Chest radiograph May 24, 2016 FINDINGS: Cardiomediastinal silhouette is unremarkable for this low inspiratory portable examination with crowded vasculature markings. The lungs are clear without pleural effusions or focal consolidations. Trachea projects midline and there is no pneumothorax. Included soft tissue planes and osseous structures are non-suspicious. Postoperative changes distal RIGHT clavicle. IMPRESSION: No active cardiopulmonary disease. Electronically Signed   By: Corey Sellers M.D.   On: 05/29/2016 17:50   CT abdomen pelvis with contrast 05/24/16: 1. Pelvic hematoma. There is evidence of active extravasation into the hematoma. 2. Pubic symphysis diastases 3. Right sacral wing fracture. 4. Superior  endplate fracture deformity involves the L3 vertebra 5. These results were called by telephone at the time of interpretation on 05/24/2016 at 2:28 pm to Dr. Orlie Sellers , who verbally acknowledged these results.    Procedures Corey Sellers (05/27/16) - OPEN REDUCTION INTERNAL FIXATION (ORIF) SYMPHYSIS PUBIS W/ TRANSSACRAL SCREWS  Hospital Course:  Corey Sellers is a 65yo male PMH of DM, GERD, HTN, melanoma who presented to [facility] 05/24/16 after falling 8-10 feet off a scaffolding and landing on his back.  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  He was taken to IR emergently for iliac artery embolization. Dr. Ronnald Ramp recommended LSO for L3 fracture--to be donned when supine and follow up repeat films in 2 weeks to monitor for stability; if xray remains stable he will probably be in brace for 6-8 weeks. Patient underwent ORIF anterior pubic symphysis with transsacral screw fixation right and left; NWB RLE for 8 weeks, WBAT LLE for transfers only, and suture removal in 2 weeks. He has had significant scrotal edema since surgery with difficulty voiding -- foley voiding trial 05/29/16 with added urecholine and flomax, and patient has continued to require some I&O cath. His healing has also been complicated by postoperative fevers but we have ruled out pneumonia with an xray and UTI with u/a, therefore is likely due to atelectasis. After 6 days of admission it was decided that the patient was stable and ready for discharge to inpatient rehab.  Physical Exam: General appearance: alert and no distress Resp: clear to auscultation bilaterally Cardio: Tachycardia GI: normal findings: bowel sounds normal and soft, non-tender Extremities: NVI GU:  scrotal swelling     Follow-up Information    Corey Sellers. Schedule an appointment as soon  as possible for a visit in 2 week(s).   Specialty:  Neurosurgery Contact information: [address] [place] [address] [phone]           Signed: Jerrye Sellers, [facility] 05/30/2016, 3:45 PM Pager: [phone] Consults: [phone] Mon-Fri 7:00 am-4:30 pm Sat-Sun 7:00 am-11:30 am

## 2016-05-30 NOTE — Interval H&P Note (Signed)
Corey Sellers was admitted today to Inpatient Rehabilitation with the diagnosis of polytrauma.  The patient's history has been reviewed, patient examined, and there is no change in status.  Patient continues to be appropriate for intensive inpatient rehabilitation.  I have reviewed the patient's chart and labs.  Questions were answered to the patient's satisfaction. The PAPE has been reviewed and assessment remains appropriate.  SWARTZ,ZACHARY T 05/30/2016, 9:59 PM

## 2016-05-30 NOTE — PMR Pre-admission (Signed)
PMR Admission Coordinator Pre-Admission Assessment  Patient: Corey Sellers is an 65 y.o., male MRN: [id number] DOB: 1951/01/31 Height: _0  (175.3 cm) Weight: 112 kg (247 lb)              Insurance Information HMO:      PPO: Yes     PCP:       IPA:       80/20:       OTHER: Group # [id number] PRIMARYLorella [place] of [place]     Policy#: [id number]      Subscriber: Cherlyn Labella CM Name: Carollee Herter      Phone#:       Fax#: [phone] Pre-Cert#: [id number] (please use rehab tool for updates) X 7 days with update due 06/05/16     Employer:  [employer] Benefits:  Phone #: [phone]     Name: Mannie Stabile. Date: 11/27/2008     Deduct: $375 (met $375)      Out of Pocket Max:  $2800 (met $1497.84)      Life Max:  Unlimited CIR: 90% w/auth      SNF: 90% w/auth 100 days max Outpatient: 90% with 60 days max per year    Co-Pay: 10% Home Health: 90% with 100 days max      Co-Pay: 10% DME: 90% and call [phone]     Co-Pay: 10% Providers: in network  Emergency [place]    Name Relation Home Work Mobile   Eichhorst,Cathy Spouse   [phone]   Murphy,April Daughter   [phone]     Current Medical History  Patient Admitting Diagnosis:  Multitrauma after fall with pelvic fracture, L3 endplate fracture and pelvic hemorrhage  History of Present Illness: A 65 y.o. right handed male with history of hypertension, diabetes mellitus. Per chart review patient lives with spouse independent prior to admission. 2 level home with bedroom first floor. Four steps to entry. Wife works day shift. Presented 05/24/2016 after a fall 8-10 feet from scaffolding while building a [employer] home. Denied loss of consciousness. X-rays and imaging revealed pelvic diastasis with pelvic hematoma, right sacral fracture, L3 superior endplate fracture. Status post angioembolization pelvic hemorrhage for interventional radiology. Underwent ORIF of anterior pubic symphysis, screw fixation right left  application of small wound VAC 05/27/2016 per Dr. Marcelino Scot. Weightbearing as tolerated left lower extremity for transfers only and nonweightbearing right lower extremity. Subcutaneous Lovenox for DVT prophylaxis. LSO brace placed for L3 endplate fracture per neurosurgery Dr. Sherley Bounds. Hospital course pain management. Acute blood loss anemia 8.2. Physical and occupational therapy evaluation completed 05/28/2016 with recommendations of physical medicine rehabilitation consult  Past Medical History  Past Medical History:  Diagnosis Date  . Diabetes mellitus   . GERD (gastroesophageal reflux disease)   . Hypertension   . 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 Wears glasses     Family History  family history is not on file.  Prior Rehab/Hospitalizations: No previous rehab.  Has the patient had major surgery during 100 days prior to admission? No  Current Medications   Current Facility-Administered Medications:  .  acetaminophen (TYLENOL) tablet 1,000 mg, 1,000 mg, Oral, Q6H PRN, Donnie Mesa, MD, 1,000 mg at 05/29/16 2134 .  irbesartan (AVAPRO) tablet 300 mg, 300 mg, Oral, Daily, 300 mg at 05/29/16 1024 **AND** amLODipine (NORVASC) tablet 10 mg, 10 mg, Oral, Daily, 10 mg at 05/30/16 0956 **AND** hydrochlorothiazide (HYDRODIURIL) tablet 25 mg, 25  mg, Oral, Daily, Freeman Caldron, PA-C, 25 mg at 05/30/16 1308 .  aspirin EC tablet 81 mg, 81 mg, Oral, QPM, Freeman Caldron, PA-C, 81 mg at 05/29/16 7097 .  bethanechol (URECHOLINE) tablet 25 mg, 25 mg, Oral, QID, Freeman Caldron, PA-C, 25 mg at 05/30/16 0954 .  cholecalciferol (VITAMIN D) tablet 4,000 Units, 4,000 Units, Oral, QPM, Freeman Caldron, PA-C, 4,000 Units at 05/29/16 1823 .  cyclobenzaprine (FLEXERIL) tablet 10 mg, 10 mg, Oral, TID PRN, Freeman Caldron, PA-C, 10 mg at 05/29/16 1258 .  docusate sodium (COLACE) capsule 100 mg, 100 mg, Oral, BID, Rodman Pickle, MD, 100 mg at  05/30/16 0954 .  enoxaparin (LOVENOX) injection 30 mg, 30 mg, Subcutaneous, Q12H, Freeman Caldron, PA-C, 30 mg at 05/30/16 0953 .  insulin aspart (novoLOG) injection 0-15 Units, 0-15 Units, Subcutaneous, TID WC, Violeta Gelinas, MD, 3 Units at 05/30/16 [phone] .  insulin aspart (novoLOG) injection 0-5 Units, 0-5 Units, Subcutaneous, QHS, Violeta Gelinas, MD, 2 Units at 05/25/16 2217 .  linagliptin (TRADJENTA) tablet 5 mg, 5 mg, Oral, Q breakfast, Freeman Caldron, PA-C, 5 mg at 05/30/16 0954 .  loratadine (CLARITIN) tablet 10 mg, 10 mg, Oral, Daily, Freeman Caldron, PA-C, 10 mg at 05/30/16 0956 .  metFORMIN (GLUCOPHAGE-XR) 24 hr tablet 1,000 mg, 1,000 mg, Oral, Q supper, Freeman Caldron, PA-C, 1,000 mg at 05/29/16 1435 .  morphine 2 MG/ML injection 2 mg, 2 mg, Intravenous, Q4H PRN, Freeman Caldron, PA-C, 2 mg at 05/28/16 1002 .  ondansetron (ZOFRAN) tablet 4 mg, 4 mg, Oral, Q6H PRN **OR** ondansetron (ZOFRAN) injection 4 mg, 4 mg, Intravenous, Q6H PRN, De Blanch Kinsinger, MD .  oxyCODONE (Oxy IR/ROXICODONE) immediate release tablet 5-15 mg, 5-15 mg, Oral, Q4H PRN, Freeman Caldron, PA-C, 10 mg at 05/29/16 2133 .  pantoprazole (PROTONIX) EC tablet 40 mg, 40 mg, Oral, Daily, Freeman Caldron, PA-C, 40 mg at 05/30/16 0956 .  phenol (CHLORASEPTIC) mouth spray 1 spray, 1 spray, Mouth/Throat, PRN, Rodman Pickle, MD, 1 spray at 05/24/16 2113 .  polyethylene glycol (MIRALAX / GLYCOLAX) packet 17 g, 17 g, Oral, Daily, Freeman Caldron, PA-C, 17 g at 05/30/16 0959 .  pregabalin (LYRICA) capsule 75 mg, 75 mg, Oral, BID, Montez Morita, PA-C .  rosuvastatin (CRESTOR) tablet 20 mg, 20 mg, Oral, QPM, Freeman Caldron, PA-C, 20 mg at 05/29/16 1823 .  tamsulosin (FLOMAX) capsule 0.8 mg, 0.8 mg, Oral, Daily, Freeman Caldron, PA-C, 0.8 mg at 05/30/16 0003  Patients Current Diet: Diet Carb Modified Fluid consistency: Thin; Room service appropriate? Yes  Precautions /  Restrictions Precautions Precautions: Back, Fall, Other (comment) Precaution Booklet Issued: No Precaution Comments: instructed in log roll technique, care taken with scrotum with mobility Spinal Brace: Thoracolumbosacral orthotic, Applied in supine position Restrictions Weight Bearing Restrictions: Yes RLE Weight Bearing: Non weight bearing LLE Weight Bearing: Weight bearing as tolerated Other Position/Activity Restrictions: Pt could maintain NWB on RLE with 2 person assist   Has the patient had 2 or more falls or a fall with injury in the past year?No.  Has only had one fall which resulted in current acute hospitalization and current injuries.  Prior Activity Level Community (5-7x/wk): Went out daily.  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 None  Prior Functional Level Prior Function Level  of Independence: Independent Comments: 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  Self Care: Did the patient need help bathing, dressing, using the toilet or eating?  Independent  Indoor Mobility: Did the patient need assistance with walking from room to room (with or without device)? Independent  Stairs: Did the patient need assistance with internal or external stairs (with or without device)? Independent  Functional Cognition: Did the patient need help planning regular tasks such as shopping or remembering to take medications? Independent  Current Functional Level Cognition  Overall Cognitive Status: Within Functional Limits for tasks assessed Orientation Level: Oriented X4    Extremity Assessment (includes Sensation/Coordination)  Upper Extremity Assessment: Overall WFL for tasks assessed  Lower Extremity Assessment: RLE deficits/detail RLE Deficits / Details: pain to move RLE and  did not formally mm test, NWB RLE: Unable to fully assess due to pain RLE Coordination: decreased gross motor, decreased fine motor    ADLs  Overall ADL's : Needs assistance/impaired Eating/Feeding: Independent, Bed level Grooming: Wash/dry hands, Wash/dry face, Bed level, Set up Upper Body Bathing: Maximal assistance, Bed level Lower Body Bathing: Total assistance, Bed level Upper Body Dressing : Maximal assistance, Bed level Lower Body Dressing: Total assistance, Bed level    Mobility  Overal bed mobility: Needs Assistance, +2 for physical assistance, + 2 for safety/equipment Bed Mobility: Rolling, Sidelying to Sit, Sit to Sidelying Rolling: Mod assist, +2 for physical assistance, +2 for safety/equipment Sidelying to sit: Mod assist, +2 for physical assistance, +2 for safety/equipment Sit to sidelying: Mod assist, +2 for physical assistance, +2 for safety/equipment General bed mobility comments: rolled to apply and remove brace, used log roll technique assisting with LEs and trunk to sit EOB, used pillow between knees to protect scrotum and assisted hips with bed pad    Transfers  Overall transfer level: Needs assistance Equipment used: Rolling walker (2 wheeled) Transfers: Sit to/from Stand Sit to Stand: Mod assist, +2 physical assistance, +2 safety/equipment General transfer comment: pt required increased time and mod A to achieve full standing position; pt able to maintain NWB R LE status throughout    Ambulation / Gait / Stairs / Wheelchair Mobility  Ambulation/Gait General Gait Details: pt attempted to advance L LE, however, unable to do so secondary to pain and pt reporting the L LE was too weak to move    Posture / Balance Dynamic Sitting Balance Sitting balance - Comments: pt able to sustain upright sitting with bilateral UE supports and min guard Balance Overall balance assessment: Needs assistance Sitting-balance support: Feet supported, Bilateral upper extremity  supported Sitting balance-Leahy Scale: Poor Sitting balance - Comments: pt able to sustain upright sitting with bilateral UE supports and min guard Standing balance support: During functional activity, Bilateral upper extremity supported Standing balance-Leahy Scale: Poor Standing balance comment: pt able to stand with NWB R LE using RW and mod A x2    Special needs/care consideration BiPAP/CPAP No CPM No Continuous Drip IV No Dialysis No        Life Vest No Oxygen No Special Bed No Trach Size No Wound Vac (area) Yes      Location: mid abdomen Skin Right hip dressing, VAc mid abdomen, scrotum enlarged, thin skin, bruising                    Bowel mgmt: Last BM 05/28/16 per chart, however, patient report no BM in the past 4-5 days Bladder mgmt: Urinary retention with in and out catheterization post removal of  foley catheter Diabetic mgmt Yes, on oral medication at home.    Previous Home Environment Living Arrangements: Spouse/significant other Available Help at Discharge: Family, Available PRN/intermittently Type of Home: House Home Layout: Two level, Able to live on main level with bedroom/bathroom Home Access: Stairs to enter Entrance Stairs-Rails: None Entrance Stairs-Number of Steps: 4 down Biochemist, clinical: [place]: No  Discharge Living Setting Plans for Discharge Living Setting: Patient's home, House, Lives with (comment) (Lives with wife.) Type of Home at Discharge: House Discharge Home Layout: Two level, Bed/bath upstairs, Able to live on main level with bedroom/bathroom Alternate Level Stairs-Number of Steps: 13 Discharge Home Access: Stairs to enter Entrance Stairs-Number of Steps: 3 steps down into home Does the patient have any problems obtaining your medications?: Yes (Describe)  Social/Family/Support Systems Patient Roles: Spouse, Parent (Has a wife and a daughter locally.) Contact Information: [place]aron Digirolamo - wife Anticipated Caregiver: wife  and daughter Anticipated Caregiver's Contact Information: Tye [place] - wife [phone] Ability/Limitations of Caregiver: Dtr 5 mins away and can check on patient.  Wife works 9 am to 6 pm [employer] Caregiver Availability: Intermittent Discharge Plan Discussed with Primary Caregiver: Yes Is Caregiver In Agreement with Plan?: Yes Does Caregiver/Family have Issues with Lodging/Transportation while Pt is in Rehab?: No  Goals/Additional Needs Patient/Family Goal for Rehab: PT/OT mod I W/C level goals Expected length of stay: 12-17 days Cultural Considerations: None Dietary Needs: Carb moc, med cal, thin liquids Equipment Needs: TBD Pt/Family Agrees to Admission and willing to participate: Yes Program Orientation Provided & Reviewed with Pt/Caregiver Including Roles  & Responsibilities: Yes  Decrease burden of Care through IP rehab admission: N/A  Possible need for SNF placement upon discharge: Not anticipated  Patient Condition: This patient's medical and functional status has changed since the consult dated: 05/28/16 in which the Rehabilitation Physician determined and documented that the patient's condition is appropriate for intensive rehabilitative care in an inpatient rehabilitation facility. See "History of Present Illness" (above) for medical update. Functional changes are: Currently requiring mod assist for transfers. Patient's medical and functional status update has been discussed with the Rehabilitation physician and patient remains appropriate for inpatient rehabilitation. Will admit to inpatient rehab today.   Preadmission Screen Completed By:  Retta Diones, 05/30/2016 11:43 AM ______________________________________________________________________   Discussed status with Dr. Naaman Plummer on 05/30/16 at 1143 and received telephone approval for admission today.  Admission Coordinator:  Retta Diones, time1143/Date09/01/17

## 2016-05-31 ENCOUNTER — Inpatient Hospital Stay (HOSPITAL_COMMUNITY): Payer: BLUE CROSS/BLUE SHIELD

## 2016-05-31 ENCOUNTER — Inpatient Hospital Stay (HOSPITAL_COMMUNITY): Payer: Self-pay | Admitting: Physical Therapy

## 2016-05-31 DIAGNOSIS — S3282XS Multiple fractures of pelvis without disruption of pelvic ring, sequela: Secondary | ICD-10-CM

## 2016-05-31 LAB — CBC WITH DIFFERENTIAL/PLATELET
Basophils Absolute: 0 10*3/uL (ref 0.0–0.1)
Basophils Relative: 1 %
Eosinophils Absolute: 0.1 10*3/uL (ref 0.0–0.7)
Eosinophils Relative: 1 %
HEMATOCRIT: 24.3 % — AB (ref 39.0–52.0)
HEMOGLOBIN: 8 g/dL — AB (ref 13.0–17.0)
LYMPHS ABS: 1 10*3/uL (ref 0.7–4.0)
LYMPHS PCT: 15 %
MCH: 29.2 pg (ref 26.0–34.0)
MCHC: 32.9 g/dL (ref 30.0–36.0)
MCV: 88.7 fL (ref 78.0–100.0)
Monocytes Absolute: 0.6 10*3/uL (ref 0.1–1.0)
Monocytes Relative: 9 %
NEUTROS PCT: 74 %
Neutro Abs: 4.9 10*3/uL (ref 1.7–7.7)
Platelets: 199 10*3/uL (ref 150–400)
RBC: 2.74 MIL/uL — AB (ref 4.22–5.81)
RDW: 13.5 % (ref 11.5–15.5)
WBC: 6.6 10*3/uL (ref 4.0–10.5)

## 2016-05-31 LAB — COMPREHENSIVE METABOLIC PANEL
ALK PHOS: 64 U/L (ref 38–126)
ALT: 70 U/L — AB (ref 17–63)
AST: 50 U/L — ABNORMAL HIGH (ref 15–41)
Albumin: 2.6 g/dL — ABNORMAL LOW (ref 3.5–5.0)
Anion gap: 9 (ref 5–15)
BUN: 14 mg/dL (ref 6–20)
CALCIUM: 8.9 mg/dL (ref 8.9–10.3)
CO2: 27 mmol/L (ref 22–32)
CREATININE: 0.91 mg/dL (ref 0.61–1.24)
Chloride: 97 mmol/L — ABNORMAL LOW (ref 101–111)
Glucose, Bld: 153 mg/dL — ABNORMAL HIGH (ref 65–99)
Potassium: 4 mmol/L (ref 3.5–5.1)
Sodium: 133 mmol/L — ABNORMAL LOW (ref 135–145)
Total Bilirubin: 0.7 mg/dL (ref 0.3–1.2)
Total Protein: 6 g/dL — ABNORMAL LOW (ref 6.5–8.1)

## 2016-05-31 LAB — GLUCOSE, CAPILLARY
GLUCOSE-CAPILLARY: 137 mg/dL — AB (ref 65–99)
GLUCOSE-CAPILLARY: 164 mg/dL — AB (ref 65–99)
GLUCOSE-CAPILLARY: 165 mg/dL — AB (ref 65–99)
Glucose-Capillary: 161 mg/dL — ABNORMAL HIGH (ref 65–99)

## 2016-05-31 LAB — URINE CULTURE: Culture: NO GROWTH

## 2016-05-31 MED ORDER — ACETAMINOPHEN 325 MG PO TABS
325.0000 mg | ORAL_TABLET | ORAL | Status: DC | PRN
  Administered 2016-06-01: 650 mg via ORAL
  Filled 2016-05-31: qty 2

## 2016-05-31 NOTE — Evaluation (Signed)
Physical Therapy Assessment and Plan  Patient Details  Name: Corey Sellers MRN: [id number] Date of Birth: 07-08-1951  PT Diagnosis: Difficulty walking, Low back pain and Muscle weakness Rehab Potential: Good ELOS: 12-16days    Today's Date: 05/31/2016 PT Individual Time: 1779-3903 PT Individual Time Calculation (min): 70 min     Problem List:  Patient Active Problem List   Diagnosis Date Noted  . Trauma 05/30/2016  . Fall 05/28/2016  . Multiple pelvic fractures ([place]) 05/28/2016  . Acute blood loss anemia 05/28/2016  . L3 vertebral fracture ([place]) 05/28/2016  . Urinary retention 05/28/2016  . Pelvic fracture ([place])   . Surgery, elective   . Benign essential HTN   . Diabetes mellitus type 2 without retinopathy ([place])   . Post-operative pain   . SIRS (systemic inflammatory response syndrome) ([facility])   . Thrombocytopenia ([place])   . Pelvic hematoma in male 05/24/2016    Past Medical History:  Past Medical History:  Diagnosis Date  . Diabetes mellitus   . GERD (gastroesophageal reflux disease)   . Hypertension   . Melanoma of neck ([place])   . 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 Wears glasses    Past Surgical History:  Past Surgical History:  Procedure Laterality Date  . APPENDECTOMY    . BASAL CELL CARCINOMA EXCISION     left chin  . EXCISION METACARPAL MASS Right 05/23/2013   Procedure: RIGHT LONG EXCISION ANNULAR LIGAMENT CYST;  Surgeon: Tennis Must, MD;  Location: [place];  Service: Orthopedics;  Laterality: Right;  . GANGLION CYST EXCISION Left 06/06/2013   Procedure: LEFT WRIST EXCISION MASS;  Surgeon: Tennis Must, MD;  Location: [place];  Service: Orthopedics;  Laterality: Left;  . IR GENERIC HISTORICAL  05/24/2016   IR ANGIOGRAM PELVIS SELECTIVE OR SUPRASELECTIVE 05/24/2016 Arne Cleveland, MD [facility]  . IR GENERIC HISTORICAL  05/24/2016   IR US GUIDE VASC ACCESS RIGHT 05/24/2016 Arne Cleveland, MD [facility]  . IR GENERIC HISTORICAL  05/24/2016   IR EMBO ART  VEN HEMORR LYMPH EXTRAV  INC GUIDE ROADMAPPING 05/24/2016 Arne Cleveland, MD [facility]  . JOINT REPLACEMENT  2003,2004   Rt shoulder  . ORIF PELVIC FRACTURE N/A 05/27/2016   Procedure: OPEN REDUCTION INTERNAL FIXATION (ORIF) SYMPHYSIS PUBIS W/ TRANSSACRAL SCREWS;  Surgeon: Altamese Yantis, MD;  Location: [place];  Service: Orthopedics;  Laterality: N/A;  . PAROTIDECTOMY W/ NECK DISSECTION TOTAL  2013   left parotidectomy-26 nodes removed  . TONSILLECTOMY    . TRIGGER FINGER RELEASE Right 05/23/2013   Procedure: RELEASE TRIGGER FINGER/A-1 PULLEY RIGHT INDEX AND LONG;  Surgeon: Tennis Must, MD;  Location: [place];  Service: Orthopedics;  Laterality: Right;  . TRIGGER FINGER RELEASE Left 06/06/2013   Procedure: RELEASE TRIGGER FINGER/A-1 PULLEY LEFT SMALL;  Surgeon: Tennis Must, MD;  Location: [place];  Service: Orthopedics;  Laterality: Left;  Marland Kitchen VARICOCELE EXCISION     left    Assessment & Plan Clinical Impression: Patient is a 65 y.o.white male with history of T2DM, HTN, melanomawho sustained a fall off 8 feet off a scaffolding (working on [employer]) on 05/24/2016 onto his back and had immediate onset of back and pelvic pain with inability to walk. 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  He was taken to IR emergently for iliac artery embolization. Dr. Ronnald Ramp recommended LSO for L3 fracture--to be donned when supine and follow up repeat films in 2 weeks to monitor for stability. Dr. Marcelino Scot consulted for input due to complexity of pelvic fracture and patient underwent ORIF anterior pubic symphysis with transsacral screw fixation right and left.  NWB RLE for 8 weeks and suture removal in 2 weeks.   He has had significant scrotal edema  with difficulty voiding--urecholine and flomax added to help with symptoms. Has had abdominal distension with constipation, fevers as well as ABLA.  Patient transferred to CIR on 05/30/2016 .   Patient currently requires mod with mobility secondary to muscle weakness, decreased cardiorespiratoy endurance and decreased standing balance, decreased balance strategies and difficulty maintaining precautions.  Prior to hospitalization, patient was independent  with mobility and lived with Spouse, Other (Comment) (2 dogs, 4 cats) in a House home.  Home access is Walkway, 3 platform steps, downStairs to enter.  Patient will benefit from skilled PT intervention to maximize safe functional mobility, minimize fall risk and decrease caregiver burden for planned discharge home with intermittent assist.  Anticipate patient will benefit from follow up [facility] at discharge.  PT - End of Session Activity Tolerance: Tolerates 10 - 20 min activity with multiple rests Endurance Deficit: Yes PT Assessment Rehab Potential (ACUTE/IP ONLY): Good Barriers to Discharge: Inaccessible home environment;Decreased caregiver support PT Patient demonstrates impairments in the following area(s): Balance;Motor;Pain;Endurance;Safety PT Transfers Functional Problem(s): Bed Mobility;Bed to Chair;Car;Floor;Furniture PT Locomotion Functional Problem(s): Ambulation;Wheelchair Mobility;Stairs PT Plan PT Intensity: Minimum of 1-2 x/day ,45 to 90 minutes PT Frequency: 5 out of 7 days PT Duration Estimated Length of Stay: 12-16days  PT Treatment/Interventions: Ambulation/gait training;Balance/vestibular training;Cognitive remediation/compensation;Community reintegration;Discharge planning;Functional mobility training;Disease management/prevention;DME/adaptive equipment instruction;Neuromuscular re-education;Pain management;Patient/family education;Psychosocial support;Skin care/wound management;Splinting/orthotics;Stair training;Therapeutic  Activities;Therapeutic Exercise;UE/LE Strength taining/ROM;UE/LE Coordination activities;Visual/perceptual remediation/compensation;Wheelchair propulsion/positioning PT Transfers Anticipated Outcome(s): supervision with LRAD  PT Locomotion Anticipated Outcome(s): Mod I WC.  PT Recommendation Follow Up Recommendations: Home health PT Patient destination: Home Equipment Recommended: Wheelchair cushion (measurements);Wheelchair (measurements);Rolling walker with 5" wheels    Skilled Therapeutic Intervention PT instructed patient in Evaluation; see below for results.  PT also instructed patient in car transfer with max using stand pivot technique and RW. Patient reports significant fatigue with return to chair and reports that knee might buckle, but no instability noted. Patient demonstrated significant difficulty maintaining NWB through RLE and did not change transfer technique folling cues from PT. PT educated in potential plan of care as well as discharge recommendations for home health PT.  Patient left sitting in [facility] with call bell in reach at end of treatment session.    PT Evaluation Precautions/Restrictions Precautions Precautions: Back;Fall Precaution Booklet Issued: No Precaution Comments: Patient unable to recall back precautions.  Required Braces or Orthoses: Spinal Brace Spinal Brace: Thoracolumbosacral orthotic;Applied in supine position Restrictions Weight Bearing Restrictions: Yes RLE Weight Bearing: Non weight bearing LLE Weight Bearing: Weight bearing as tolerated Other Position/Activity Restrictions: Pt could maintain NWB on RLE with 2 person assist General   Vital Signs Pain Pain Assessment Pain Assessment: No/denies pain Pain Score: 0-No pain Pain Location: Back Pain Descriptors / Indicators: Aching Pain Onset: On-going Patients Stated Pain Goal: 6 Pain Intervention(s): Medication (See eMAR);Repositioned Home Living/Prior Functioning Home Living Available Help  at Discharge: Family;Available PRN/intermittently Type of Home: House Home Access: Stairs to enter Entrance Stairs-Number of Steps: Walkway, 3 platform steps, down Entrance Stairs-Rails: None Home Layout: Two level;Able to live on  main level with bedroom/bathroom Alternate Level Stairs-Number of Steps: 13 steps to upper level Alternate Level Stairs-Rails: None Bathroom Shower/Tub: Tub/shower unit;Door Bathroom Toilet: Standard Bathroom Accessibility: No Additional Comments: doubtful, narrow door width may limit.  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  Sensation Sensation Light Touch: Appears Intact Stereognosis: Appears Intact Hot/Cold: Appears Intact Proprioception: Appears Intact Coordination Gross Motor Movements are Fluid and Coordinated: Yes Fine Motor Movements are Fluid and Coordinated: Yes Motor  Motor Motor: Within Functional Limits;Other (comment) Motor - Skilled Clinical Observations: weakness secondary to pain  Mobility Bed Mobility Bed Mobility: Rolling Right;Rolling Left;Supine to Sit;Sit to Supine Rolling Right: 4: Min assist (with bed rails) Rolling Right Details: Verbal cues for technique;Verbal cues for precautions/safety;Verbal cues for safe use of DME/AE Rolling Left: 4: Min assist (with bed rails) Rolling Left Details: Verbal cues for technique;Verbal cues for precautions/safety;Verbal cues for safe use of DME/AE Supine to Sit: 3: Mod assist (with HOB elevated and heavy use of  rails) Supine to Sit Details: Verbal cues for technique;Verbal cues for precautions/safety;Tactile cues for placement Sit to Supine: 3: Mod assist Sit to Supine - Details: Tactile cues for placement;Tactile cues for weight beaing;Verbal cues for technique;Visual cues/gestures for sequencing Transfers Transfers: Yes Sit to Stand: 3: Mod assist Sit to Stand Details: Visual cues for safe use of DME/AE;Verbal cues for safe use of DME/AE;Verbal cues for technique;Verbal cues for precautions/safety Stand Pivot Transfers: 3: Mod assist Stand Pivot Transfer Details: Verbal cues for technique;Verbal cues for precautions/safety;Visual cues/gestures for precautions/safety;Visual cues for safe use of DME/AE;Verbal cues for safe use of DME/AE Locomotion  Ambulation Ambulation: No Gait Gait: No Stairs / Additional Locomotion Stairs: No Wheelchair Mobility Wheelchair Mobility: Yes Wheelchair Assistance: 4: Advertising account executive Details: Verbal cues for technique;Verbal cues for precautions/safety;Verbal cues for safe use of DME/AE Wheelchair Propulsion: Both upper extremities Wheelchair Parts Management: Needs assistance Distance: 164f  Trunk/Postural Assessment  Cervical Assessment Cervical Assessment: Within Functional Limits Thoracic Assessment Thoracic Assessment: Within Functional Limits Lumbar Assessment Lumbar Assessment: Exceptions to WFL (TLSO and Back precautions. ) Postural Control Postural Control: Within Functional Limits  Balance Balance Balance Assessed: Yes Static Sitting Balance Static Sitting - Balance Support: No upper extremity supported Static Sitting - Level of Assistance: 5: Stand by assistance Dynamic Sitting Balance Dynamic Sitting - Level of Assistance: 4: Min assist;5: Stand by assistance Static Standing Balance Static Standing - Level of Assistance: 3: Mod assist (BUE support) Extremity Assessment  RUE Assessment RUE Assessment: Within Functional  Limits LUE Assessment LUE Assessment: Within Functional Limits RLE Assessment RLE Assessment: Exceptions to [facility] Strength RLE Overall Strength Comments: knee and ankle 5/5, hip 4/5 for all motions due to pain  LLE Assessment LLE Assessment: Exceptions to [facility] Strength LLE Overall Strength Comments: knee and ankle 5/5, hip abduciton 4+/5, all other hip motions 4/5 due to pain   See Function Navigator for Current Functional Status.   Refer to Care Plan for Long Term Goals  Recommendations for other services: None  Discharge Criteria: Patient will be discharged from PT if patient refuses treatment 3 consecutive times without medical reason, if  treatment goals not met, if there is a change in medical status, if patient makes no progress towards goals or if patient is discharged from hospital.  The above assessment, treatment plan, treatment alternatives and goals were discussed and mutually agreed upon: by patient  Lorie Phenix 05/31/2016, 12:55 PM

## 2016-05-31 NOTE — Progress Notes (Signed)
[place] PHYSICAL MEDICINE & REHABILITATION     PROGRESS NOTE    Subjective/Complaints: Still not emptying bladder---needing frequent caths. Hasn't moved bowels  Objective: Vital Signs: Blood pressure (!) 140/59, pulse 99, temperature (!) 100.9 F (38.3 C), temperature source Oral, resp. rate 18, height 5\' 9"  (1.753 m), weight 111.8 kg (246 lb 6.4 oz), SpO2 98 %. Dg Chest 2 View  Result Date: 05/29/2016 CLINICAL DATA:  RIGHT-sided chest pain and fever for 2 days after surgery. History of hypertension and diabetes. EXAM: CHEST  2 VIEW COMPARISON:  Chest radiograph May 24, 2016 FINDINGS: Cardiomediastinal silhouette is unremarkable for this low inspiratory portable examination with crowded vasculature markings. The lungs are clear without pleural effusions or focal consolidations. Trachea projects midline and there is no pneumothorax. Included soft tissue planes and osseous structures are non-suspicious. Postoperative changes distal RIGHT clavicle. IMPRESSION: No active cardiopulmonary disease. Electronically Signed   By: Elon Alas M.D.   On: 05/29/2016 17:50    Recent Labs  05/30/16 1010 05/31/16 0503  WBC 6.9 6.6  HGB 7.8* 8.0*  HCT 24.6* 24.3*  PLT 180 199    Recent Labs  05/31/16 0503  NA 133*  K 4.0  CL 97*  GLUCOSE 153*  BUN 14  CREATININE 0.91  CALCIUM 8.9   CBG (last 3)   Recent Labs  05/30/16 1752 05/30/16 2107 05/31/16 0635  GLUCAP 145* 170* 137*    Wt Readings from Last 3 Encounters:  05/31/16 111.8 kg (246 lb 6.4 oz)  05/24/16 112 kg (247 lb)  12/28/13 106.1 kg (234 lb)    Physical Exam:  Constitutional: He is oriented to person, place, and time. He appears well-developed and well-nourished. Nasal cannula in place.  Up in chair reporting back discomfort.   HENT:  Head: Normocephalic and atraumatic.  Right Ear: External ear normal.  Left Ear: External ear normal.  Mouth/Throat: Oropharynx is clear and moist.  Eyes: Conjunctivae are  normal. Pupils are equal, round, and reactive to light. Right eye exhibits no discharge. Left eye exhibits no discharge. No scleral icterus.  Neck: Normal range of motion. Neck supple.  Cardiovascular: Normal rate and regular rhythm.  Exam reveals no gallop and no friction rub.   No murmur heard. Respiratory: Effort normal and breath sounds normal. No stridor. No respiratory distress. He has no wheezes.  GI: Normal appearance and bowel sounds are present. He exhibits distension. There is no tenderness.  Lower abdominal wound with dry surgical dressing in place.  Genitourinary: Penile tenderness present.  Genitourinary Comments: Scrotum edematous with diffuse ecchymosis unchanged   Musculoskeletal: He exhibits edema. He exhibits no tenderness.  Neurological: He is alert and oriented to person, place, and time.  Speech clear. Able to follow basic commands without difficulty. UE strength grossly 5/5. LE: 2-/5HF , 2+ KE and 4/5 ADF/PF. No sensory changes. Cognitively appears intact  Skin: Skin is warm and dry. There is erythema.  Hypogastric incision intact with sutures, no discharge  Psychiatric: He has a normal mood and affect. His behavior is normal. Thought content normal.    Assessment/Plan: 1. Mobility and functional deficits secondary to polytrauma which require 3+ hours per day of interdisciplinary therapy in a comprehensive inpatient rehab setting. Physiatrist is providing close team supervision and 24 hour management of active medical problems listed below. Physiatrist and rehab team continue to assess barriers to discharge/monitor patient progress toward functional and medical goals.  Function:  Bathing Bathing position      Bathing parts  Bathing assist        Upper Body Dressing/Undressing Upper body dressing                    Upper body assist        Lower Body Dressing/Undressing Lower body dressing                                  Lower  body assist        Toileting Toileting          Toileting assist     Transfers Chair/bed transfer   Chair/bed transfer method: Stand pivot Chair/bed transfer assist level: Moderate assist (Pt 50 - 74%/lift or lower) Chair/bed transfer assistive device: Walker, Education officer, community Comprehension Comprehension assist level: Follows complex conversation/direction with extra time/assistive device  Expression Expression assist level: Expresses complex ideas: With no assist  Social Interaction Social Interaction assist level: Interacts appropriately 90% of the time - Needs monitoring or encouragement for participation or interaction.  Problem Solving Problem solving assist level: Solves basic 90% of the time/requires cueing < 10% of the time  Memory     Medical Problem List and Plan: 1.  Mobility and functional deficits secondary to polytrauma as above                       -begin CIR 2.  DVT Prophylaxis/Anticoagulation: Pharmaceutical: Lovenox 3. Pain Management: Oxycodone prn effective.  4. Mood: LCSW to follow for evaluation and support.  5. Neuropsych: This patient is capable of making decisions on his own behalf. 6. Skin/Wound Care: Monitor wound of healing.  7. Fluids/Electrolytes/Nutrition: Monitor I/O. Check lytes in am.  8. L3 endplate fracture: Back precautions --don LSO when patient supine 9. ABLA: Serial CBC to monitor H/H  10. Fevers: Encourage IS  -UA negative, UCX pending            -start patient on bowel program for constipation              -check dopplers 11.  Urinary retention:  Likely due to pelvic hematoma, immobility, constipation. Continue to  voiding with PVR checks.continue flomax.  -hold urecholine  -insert foley for comfort  -Rule out UTI as cause of infection.  12. T2DM: Monitor BS ac/hs. On metformin--use SSI for elevated BS.  13. HTN: Will monitor BP bid--continue avapro, norvasc and  HCTZ.  14. Constipation: Will schedule miralax bid.  LOS (Days) 1 A FACE TO FACE EVALUATION WAS PERFORMED  SWARTZ,ZACHARY T 05/31/2016 10:08 AM

## 2016-05-31 NOTE — Evaluation (Signed)
Occupational Therapy Assessment and Plan  Patient Details  Name: Corey Sellers MRN: [id number] Date of Birth: 1950/11/28  OT Diagnosis: acute pain, lumbago (low back pain) and muscle weakness (generalized) Rehab Potential: Rehab Potential (ACUTE ONLY): Good ELOS: 10-14 DAYS   Today's Date: 05/31/2016 OT Individual Time: 0800-0900 OT Individual Time Calculation (min): 60 min      Problem List:  Patient Active Problem List   Diagnosis Date Noted  . Trauma 05/30/2016  . Fall 05/28/2016  . Multiple pelvic fractures ([place]) 05/28/2016  . Acute blood loss anemia 05/28/2016  . L3 vertebral fracture ([place]) 05/28/2016  . Urinary retention 05/28/2016  . Pelvic fracture ([place])   . Surgery, elective   . Benign essential HTN   . Diabetes mellitus type 2 without retinopathy ([place])   . Post-operative pain   . SIRS (systemic inflammatory response syndrome) ([facility])   . Thrombocytopenia ([place])   . Pelvic hematoma in male 05/24/2016    Past Medical History:  Past Medical History:  Diagnosis Date  . Diabetes mellitus   . GERD (gastroesophageal reflux disease)   . Hypertension   . Melanoma of neck ([place])   . 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 Wears glasses    Past Surgical History:  Past Surgical History:  Procedure Laterality Date  . APPENDECTOMY    . BASAL CELL CARCINOMA EXCISION     left chin  . EXCISION METACARPAL MASS Right 05/23/2013   Procedure: RIGHT LONG EXCISION ANNULAR LIGAMENT CYST;  Surgeon: Tennis Must, MD;  Location: [place];  Service: Orthopedics;  Laterality: Right;  . GANGLION CYST EXCISION Left 06/06/2013   Procedure: LEFT WRIST EXCISION MASS;  Surgeon: Tennis Must, MD;  Location: [place];  Service: Orthopedics;  Laterality: Left;  . IR GENERIC HISTORICAL  05/24/2016   IR ANGIOGRAM PELVIS SELECTIVE OR SUPRASELECTIVE 05/24/2016 Arne Cleveland, MD [facility]  . IR GENERIC HISTORICAL  05/24/2016   IR US GUIDE VASC  ACCESS RIGHT 05/24/2016 Arne Cleveland, MD [facility]  . IR GENERIC HISTORICAL  05/24/2016   IR EMBO ART  VEN HEMORR LYMPH EXTRAV  INC GUIDE ROADMAPPING 05/24/2016 Arne Cleveland, MD [facility]  . JOINT REPLACEMENT  2003,2004   Rt shoulder  . ORIF PELVIC FRACTURE N/A 05/27/2016   Procedure: OPEN REDUCTION INTERNAL FIXATION (ORIF) SYMPHYSIS PUBIS W/ TRANSSACRAL SCREWS;  Surgeon: Altamese [place], MD;  Location: [place];  Service: Orthopedics;  Laterality: N/A;  . PAROTIDECTOMY W/ NECK DISSECTION TOTAL  2013   left parotidectomy-26 nodes removed  . TONSILLECTOMY    . TRIGGER FINGER RELEASE Right 05/23/2013   Procedure: RELEASE TRIGGER FINGER/A-1 PULLEY RIGHT INDEX AND LONG;  Surgeon: Tennis Must, MD;  Location: [place];  Service: Orthopedics;  Laterality: Right;  . TRIGGER FINGER RELEASE Left 06/06/2013   Procedure: RELEASE TRIGGER FINGER/A-1 PULLEY LEFT SMALL;  Surgeon: Tennis Must, MD;  Location: [place];  Service: Orthopedics;  Laterality: Left;  Marland Kitchen VARICOCELE EXCISION     left    Assessment & Plan Clinical Impression: Patient is a 65 y.o. white male with history of T2DM, HTN, melanomawho sustained a fall off 8 feet off a scaffolding (working on [employer]) on 05/24/2016 onto his back and had immediate onset of back and pelvic pain with inability to walk. He was noted to be hypotensive and was found to have pelvic symphysis diastasis with large pelvic hematoma with active extravasation,  right sacral  wing fracture, superior endplate fracture deformity L3 vertebra. He was taken to IR emergently for iliac artery embolization. Dr. Ronnald Ramp recommended LSO for L3 fracture--to be donned when supine and follow up repeat films in 2 weeks to monitor for stability. Dr. Marcelino Scot consulted for input due to complexity of pelvic fracture and patient underwent ORIF anterior pubic symphysis with transsacral screw fixation right and left.  NWB RLE for 8 weeks and suture removal in  2 weeks.   He has had significant scrotal edema with difficulty voiding--urecholine and flomax added to help with symptoms. Has had abdominal distension with constipation, fevers as well as ABLA. Therapy ongoing and CIR recommended for follow up therapy.  Patient transferred to CIR on 05/30/2016.    Patient currently requires moderate assistance with basic self-care skills secondary to muscle weakness.  Prior to hospitalization, patient could complete BADL and iADL independently.   Patient will benefit from skilled intervention to increase independence with basic self-care skills prior to discharge home with care partner.  Anticipate patient will require minimal physical assistance and no further OT follow recommended.  OT - End of Session Activity Tolerance: Tolerates 30+ min activity with multiple rests Endurance Deficit: Yes OT Assessment Rehab Potential (ACUTE ONLY): Good OT Patient demonstrates impairments in the following area(s): Balance;Edema;Endurance;Pain;Safety OT Basic ADL's Functional Problem(s): Bathing;Dressing;Toileting OT Transfers Functional Problem(s): Toilet;Tub/Shower OT Plan OT Intensity: Minimum of 1-2 x/day, 45 to 90 minutes OT Frequency: 5 out of 7 days OT Duration/Estimated Length of Stay: 10-14 DAYS OT Treatment/Interventions: DME/adaptive equipment instruction;Functional mobility training;Pain management;Patient/family education;Therapeutic Activities;Therapeutic Exercise;Discharge planning;Balance/vestibular training;Wheelchair propulsion/positioning OT Self Feeding Anticipated Outcome(s): I OT Basic Self-Care Anticipated Outcome(s): Min A OT Toileting Anticipated Outcome(s): Mod I OT Bathroom Transfers Anticipated Outcome(s): Mod I OT Recommendation Patient destination: Home Follow Up Recommendations: None Equipment Recommended: To be determined Equipment Details: Pt and spouse mildly argumentative regarding patient's expressed preference to reside at upper  level of home versus lower level.   Skilled Therapeutic Intervention OT 1:1 initial eval completed with wife present during first 30 minutes.   Pt and wife educated on methods and goals of treatment, pain management, ELOS, and use of AE/DME for safety..   With extra time to manage pain d/t edema at scrotum, OT assist to don TLSO, patient completed stand pivot transfer to w/c and groomed at sink with setup assist.   Pt fatigued quickly during session and required extra time to manage pain from scrotal edema, requesting return to bed for rest at end of session.  OT Evaluation Precautions/Restrictions  Precautions Precautions: Back;Fall Precaution Booklet Issued: No Precaution Comments: instructed in log roll technique, care taken with scrotum with mobility Required Braces or Orthoses: Spinal Brace Spinal Brace: Thoracolumbosacral orthotic;Applied in supine position Restrictions Weight Bearing Restrictions: Yes RLE Weight Bearing: Weight bearing as tolerated LLE Weight Bearing: Weight bearing as tolerated Other Position/Activity Restrictions: Pt could maintain NWB on RLE with 2 person assist  General Chart Reviewed: Yes Family/Caregiver Present: Yes  Vital Signs Therapy Vitals Temp: (!) 100.8 F (38.2 C) Temp Source: Oral Pulse Rate: (!) 102 Resp: 18 BP: (!) 118/52 Patient Position (if appropriate): Lying Oxygen Therapy SpO2: 94 % O2 Device: Not Delivered  Pain Pain Assessment Pain Assessment: 0-10 Pain Score: 10-Worst pain ever Faces Pain Scale: No hurt Pain Location: Back Pain Descriptors / Indicators: Aching Pain Onset: On-going Patients Stated Pain Goal: 6 Pain Intervention(s): Medication (See eMAR);Repositioned  Home Living/Prior Functioning Home Living Available Help at Discharge: Family, Available PRN/intermittently Type of Home: House  Home Access: Stairs to enter Entrance Stairs-Number of Steps: Walkway, 3 platform steps, down Entrance Stairs-Rails:  None Home Layout: Two level, Able to live on main level with bedroom/bathroom Alternate Level Stairs-Number of Steps: 13 steps to upper level Alternate Level Stairs-Rails: None Bathroom Shower/Tub: Tub/shower unit, Door [employer]: Standard Bathroom Accessibility: No Additional Comments: doubtful, narrow door width may limit.  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, Office manager Leisure and Hobbies: Psychologist, occupational, dog shows,  Prior Function Level of Independence: Independent with basic ADLs  Able to Take Stairs?: Yes Driving: Yes Vocation: Full time employment Comments: 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  ADL ADL ADL Comments: see Functional Assessment  Vision/Perception  Vision- History Patient Visual Report: No change from baseline   Cognition Overall Cognitive Status: Within Functional Limits for tasks assessed Arousal/Alertness: Awake/alert Orientation Level: Person;Situation;Place Person: Oriented Place: Oriented Situation: Oriented Year: 2017 Month: September Day of Week: Correct Memory: Appears intact Immediate Memory Recall: Sock;Blue;Bed Memory Recall: Sock;Blue;Bed Memory Recall Sock: Without Cue Memory Recall Blue: Without Cue Memory Recall Bed: Without Cue Attention: Divided Divided Attention: Appears intact Awareness: Appears intact Problem Solving: Appears intact Safety/Judgment: Appears intact  Sensation Sensation Light Touch: Appears Intact Stereognosis: Appears Intact Hot/Cold: Appears  Intact Proprioception: Appears Intact Coordination Gross Motor Movements are Fluid and Coordinated: Yes Fine Motor Movements are Fluid and Coordinated: Yes  Motor  Motor Motor: Within Functional Limits  Mobility  Bed Mobility Bed Mobility: Rolling Right;Rolling Left;Supine to Sit;Sit to Supine Rolling Right: 4: Min assist Rolling Right Details: Verbal cues for technique;Verbal cues for precautions/safety;Verbal cues for safe use of DME/AE Rolling Left: 4: Min assist Rolling Left Details: Verbal cues for technique;Verbal cues for precautions/safety;Verbal cues for safe use of DME/AE Supine to Sit: 3: Mod assist Supine to Sit Details: Verbal cues for technique;Verbal cues for precautions/safety;Tactile cues for placement Sit to Supine: 3: Mod assist Sit to Supine - Details: Tactile cues for placement;Tactile cues for weight beaing;Verbal cues for technique;Visual cues/gestures for sequencing Transfers Sit to Stand: 3: Mod assist Sit to Stand Details: Visual cues for safe use of DME/AE;Verbal cues for safe use of DME/AE;Verbal cues for technique;Verbal cues for precautions/safety   Trunk/Postural Assessment  Cervical Assessment Cervical Assessment: Within Functional Limits Thoracic Assessment Thoracic Assessment: Within Functional Limits Lumbar Assessment Lumbar Assessment: Exceptions to WFL (TLSO and back precautions) Postural Control Postural Control: Within Functional Limits   Balance Balance Balance Assessed: Yes Static Sitting Balance Static Sitting - Balance Support: No upper extremity supported Static Sitting - Level of Assistance: 5: Stand by assistance Dynamic Sitting Balance Dynamic Sitting - Level of Assistance: 4: Min assist;5: Stand by assistance Static Standing Balance Static Standing - Level of Assistance: 3: Mod assist (BUE support)   Extremity/Trunk Assessment RUE Assessment RUE Assessment: Within Functional Limits LUE Assessment LUE Assessment: Within  Functional Limits   See Function Navigator for Current Functional Status.   Refer to Care Plan for Long Term Goals  Recommendations for other services: None  Discharge Criteria: Patient will be discharged from OT if patient refuses treatment 3 consecutive times without medical reason, if treatment goals not met, if there is a change in medical status, if patient makes  no progress towards goals or if patient is discharged from hospital.  The above assessment, treatment plan, treatment alternatives and goals were discussed and mutually agreed upon: by patient   Second session: Time: 1300-1400 Time Calculation (min):  60 min  Pain Assessment: 5/10, scrotum, repositioned  Skilled Therapeutic Interventions: ADL-retraining at shower level with focus on use of AE to extend reach (LH sponge, reacher).   Pt received supine in bed and able to rise to EOB with mod assist and min instructional cues.    Pt performed stand pivot transfer to w/c with extra time and mod assist to lower, vc, to reach from armrests.   Pt performed similar transfer to tub bench.   Pt bathed thoroughly while wearing TLSO.   Pt fatigued after bathing, performing squat pivot transfers with mod assist to lift and guide to position in w/c.   Pt recovered to bed and required assist to remove TLSO while supine to replace wet pads.   Pt attempted use of reacher during session with good success but endurance and pain limited efficiency and attention.      See FIM for current functional status  Therapy/Group: Individual Therapy  Dale Strausser 06/01/2016, 7:19 AM

## 2016-06-01 ENCOUNTER — Inpatient Hospital Stay (HOSPITAL_COMMUNITY): Payer: Self-pay | Admitting: Occupational Therapy

## 2016-06-01 ENCOUNTER — Inpatient Hospital Stay (HOSPITAL_COMMUNITY): Payer: BLUE CROSS/BLUE SHIELD

## 2016-06-01 DIAGNOSIS — R609 Edema, unspecified: Secondary | ICD-10-CM

## 2016-06-01 DIAGNOSIS — M79609 Pain in unspecified limb: Secondary | ICD-10-CM

## 2016-06-01 LAB — GLUCOSE, CAPILLARY
Glucose-Capillary: 176 mg/dL — ABNORMAL HIGH (ref 65–99)
Glucose-Capillary: 180 mg/dL — ABNORMAL HIGH (ref 65–99)
Glucose-Capillary: 156 mg/dL — ABNORMAL HIGH (ref 65–99)
Glucose-Capillary: 153 mg/dL — ABNORMAL HIGH (ref 65–99)

## 2016-06-01 MED ORDER — SORBITOL 70 % SOLN
60.0000 mL | Status: AC
  Administered 2016-06-01: 60 mL via ORAL
  Filled 2016-06-01: qty 60

## 2016-06-01 NOTE — Progress Notes (Signed)
Occupational Therapy Session Note  Patient Details  Name: Corey Sellers MRN: [id number] Date of Birth: 19-Nov-1950  Today's Date: 06/01/2016 OT Individual Time: [id number] OT Individual Time Calculation (min): 61 min     Short Term Goals: Week 1:  OT Short Term Goal 1 (Week 1): Complete toileting using AE with min assist OT Short Term Goal 2 (Week 1): Bathe sitting using lateral leans to wash buttocks and AE to extend reach with mod assist OT Short Term Goal 3 (Week 1): Don TLSO with min assist while supine OT Short Term Goal 4 (Week 1): Dress lower body using AE, prn, with mod assist OT Short Term Goal 5 (Week 1): Transfer to [facility] with steadying assist  Skilled Therapeutic Interventions/Progress Updates:   Pt participated in skilled OT tx focusing on adherence to back and R LE precautions, safety, activity tolerance, and proper use of AE during self care completion. Pt was agreeable to complete ADLs this morning with max encouragement due to being fatigued. TLSO donned while supine via logroll technique. Pt ambulated with RW to w/c at sink with max cuing for adherence to NWB precautions. LB ADLs completed with instruction on use of AE with good carryover demonstration with extra time for rest breaks. Pillow provided to prevent arching due to scrotum discomfort. Afterwards pt ambulated back to bed. Mod A sit<stand and steady assist for ambulating today. UB ADLs completed via logroll technique once TLSO was doffed with overall Min A for back. Pt left in bed with all needs within reach at end of session. Pt requested for pain medication at end of treatment and nursing was notified at time of departure. Pt continues to benefit from precaution education due to frequent cuing.    Therapy Documentation Precautions:  Precautions Precautions: Back, Fall Precaution Booklet Issued: No Precaution Comments: Patient unable to recall back precautions.  Required Braces or Orthoses: Spinal Brace Spinal Brace:  Thoracolumbosacral orthotic, Applied in supine position Restrictions Weight Bearing Restrictions: Yes RLE Weight Bearing: Non weight bearing LLE Weight Bearing: Weight bearing as tolerated Other Position/Activity Restrictions: Pt could maintain NWB on RLE with 2 person assist General:   Vital Signs:   Pain: Pt reported pain was manageable today with provided rest breaks  Pain Assessment Pain Assessment: 0-10 Pain Score: 2  Pain Type: Surgical pain Pain Location: Pelvis Pain Descriptors / Indicators: Aching Pain Frequency: Constant Pain Onset: With Activity Pain Intervention(s): Medication (See eMAR) ADL: ADL ADL Comments: see Functional Assessment Exercises:   Other Treatments:    See Function Navigator for Current Functional Status.   Therapy/Group: Individual Therapy  Asusena Sigley A Nkosi Cortright 06/01/2016, 12:35 PM

## 2016-06-01 NOTE — Progress Notes (Signed)
[facility] PHYSICAL MEDICINE & REHABILITATION     PROGRESS NOTE    Subjective/Complaints: Disgruntled about hospital food quality. Moved bowels yesterday (medium sized). Belly feels better. Having low back pain  ROS: Pt denies fever, rash/itching, headache, blurred or double vision, nausea, vomiting,   diarrhea, chest pain, shortness of breath, palpitations, dysuria, dizziness, neck  pain, bleeding, anxiety, or depression   Objective: Vital Signs: Blood pressure 135/68, pulse 97, temperature 99.1 F (37.3 C), temperature source Oral, resp. rate 18, height 5\' 9"  (1.753 m), weight 111.8 kg (246 lb 6.4 oz), SpO2 91 %. Dg Abd Portable 1v  Result Date: 05/31/2016 CLINICAL DATA:  Ileus, abdominal distention EXAM: PORTABLE ABDOMEN - 1 VIEW COMPARISON:  05/27/2016 FINDINGS: Nonobstructive bowel gas pattern. No gaseous distention to suggest adynamic ileus. Moderate colonic stool burden. Postoperative changes involving the superior pubic rami/pubic symphysis and bilateral sacroiliac joints. IMPRESSION: Moderate colonic stool burden, raising the possibility of constipation. No evidence of bowel obstruction or adynamic ileus. Electronically Signed   By: Julian Hy M.D.   On: 05/31/2016 10:39    Recent Labs  05/30/16 1010 05/31/16 0503  WBC 6.9 6.6  HGB 7.8* 8.0*  HCT 24.6* 24.3*  PLT 180 199    Recent Labs  05/31/16 0503  NA 133*  K 4.0  CL 97*  GLUCOSE 153*  BUN 14  CREATININE 0.91  CALCIUM 8.9   CBG (last 3)   Recent Labs  05/31/16 1624 05/31/16 2122 06/01/16 0644  GLUCAP 165* 161* 176*    Wt Readings from Last 3 Encounters:  05/31/16 111.8 kg (246 lb 6.4 oz)  05/24/16 112 kg (247 lb)  12/28/13 106.1 kg (234 lb)    Physical Exam:  Constitutional: He is oriented to person, place, and time. He appears well-developed and well-nourished.      HENT:  Head: Normocephalic and atraumatic.  Right Ear: External ear normal.  Left Ear: External ear normal.   Mouth/Throat: Oropharynx is clear and moist.  Eyes: Conjunctivae are normal. Pupils are equal, round, and reactive to light. Right eye exhibits no discharge. Left eye exhibits no discharge. No scleral icterus.  Neck: Normal range of motion. Neck supple.  Cardiovascular: Normal rate and regular rhythm.  Exam reveals no gallop and no friction rub.   No murmur heard. Respiratory: Effort normal and breath sounds normal. No stridor. No respiratory distress. He has no wheezes.  GI: Normal appearance and bowel sounds are present. He exhibits distension. There is no tenderness.  Lower abdominal wound with dry surgical dressing in place.  Genitourinary: Penile tenderness present.  Genitourinary Comments: Scrotum edematous with diffuse ecchymosis unchanged today   Musculoskeletal: He exhibits edema. He exhibits no tenderness.  Neurological: He is alert and oriented to person, place, and time.  Speech clear. Able to follow basic commands without difficulty. UE strength grossly 5/5. LE: 2-/5HF , 2+ KE and 4/5 ADF/PF. No sensory changes. Cognitively appears intact  Skin: Skin is warm and dry. There is erythema.  Hypogastric incision intact with sutures, no discharge  Psychiatric: He has a normal mood and affect. His behavior is normal. Thought content normal.    Assessment/Plan: 1. Mobility and functional deficits secondary to polytrauma which require 3+ hours per day of interdisciplinary therapy in a comprehensive inpatient rehab setting. Physiatrist is providing close team supervision and 24 hour management of active medical problems listed below. Physiatrist and rehab team continue to assess barriers to discharge/monitor patient progress toward functional and medical goals.  Function:  Bathing Bathing  position   Position: Shower  Bathing parts Body parts bathed by patient: Right arm, Left arm, Right upper leg, Left upper leg, Right lower leg, Left lower leg Body parts bathed by helper: Back,  Buttocks  Bathing assist Assist Level: Touching or steadying assistance(Pt > 75%)      Upper Body Dressing/Undressing Upper body dressing   What is the patient wearing?: Hospital gown                Upper body assist Assist Level: Touching or steadying assistance(Pt > 75%)      Lower Body Dressing/Undressing Lower body dressing   What is the patient wearing?: Non-skid slipper socks           Non-skid slipper socks- Performed by helper: Don/doff right sock, Don/doff left sock                  Lower body assist Assist for lower body dressing:  (total assist)      Toileting Toileting Toileting activity did not occur: Refused        Toileting assist     Transfers Chair/bed transfer   Chair/bed transfer method: Stand pivot Chair/bed transfer assist level: Moderate assist (Pt 50 - 74%/lift or lower) Chair/bed transfer assistive device: Walker, Air cabin crew Ambulation activity did not occur: Safety/medical concerns         Wheelchair   Type: Manual Max wheelchair distance: 14ft  Assist Level: Touching or steadying assistance (Pt > 75%)  Cognition Comprehension Comprehension assist level: Follows basic conversation/direction with no assist  Expression Expression assist level: Expresses basic needs/ideas: With no assist  Social Interaction Social Interaction assist level: Interacts appropriately with others - No medications needed.  Problem Solving Problem solving assist level: Solves basic problems with no assist  Memory Memory assist level: More than reasonable amount of time   Medical Problem List and Plan: 1.  Mobility and functional deficits secondary to polytrauma as above                       -continue CIR therapies 2.  DVT Prophylaxis/Anticoagulation: Pharmaceutical: Lovenox 3. Pain Management: Oxycodone prn effective.  4. Mood: LCSW to follow for evaluation and support.  5. Neuropsych: This patient is capable of making  decisions on his own behalf. 6. Skin/Wound Care: Monitor wound of healing.  7. Fluids/Electrolytes/Nutrition: Monitor I/O. Encourage po.  8. L3 endplate fracture: Back precautions --don LSO when patient supine 9. ABLA: Serial CBC to monitor H/H  10. Fevers: Encourage IS  -UA negative, UCX negative also            -rx constipation              -check dopplers 11.  Urinary retention:  Likely due to pelvic hematoma, immobility, constipation.   -continue flomax.  -hold urecholine  -inserted foley for comfort for until bowels completely evacuated  -ua and ucx negative.  12. T2DM: Monitor BS ac/hs. On metformin--use SSI for elevated BS.  13. HTN: Will monitor BP bid--continue avapro, norvasc and HCTZ.  14. Constipation:  miralax bid.   -some results with miralax and dulcolax supp yesterday   -constipation on KUB  -sorbitol today, dulcolax supp or SSE if needed LOS (Days) 2 A FACE TO FACE EVALUATION WAS PERFORMED  SWARTZ,ZACHARY T 06/01/2016 11:06 AM

## 2016-06-01 NOTE — Plan of Care (Signed)
Problem: RH BOWEL ELIMINATION Goal: RH STG MANAGE BOWEL WITH ASSISTANCE STG Manage Bowel with Assistance.  Outcome: Progressing LBM 9/2; pt on miralax

## 2016-06-01 NOTE — Progress Notes (Signed)
VASCULAR LAB PRELIMINARY  PRELIMINARY  PRELIMINARY  PRELIMINARY  Bilateral lower extremity venous duplex completed.    Preliminary report:  Bilateral:  No evidence of DVT, superficial thrombosis, or Baker's Cyst.   Kelvon Giannini, RVS 06/01/2016, 11:35 AM

## 2016-06-01 NOTE — Plan of Care (Signed)
Problem: RH BLADDER ELIMINATION Goal: RH STG MANAGE BLADDER WITH ASSISTANCE STG Manage Bladder With Assistance  Outcome: Not Progressing Pt has foley catheter

## 2016-06-02 ENCOUNTER — Inpatient Hospital Stay (HOSPITAL_COMMUNITY): Payer: Self-pay | Admitting: Occupational Therapy

## 2016-06-02 ENCOUNTER — Inpatient Hospital Stay (HOSPITAL_COMMUNITY): Payer: BLUE CROSS/BLUE SHIELD | Admitting: Physical Therapy

## 2016-06-02 DIAGNOSIS — K5909 Other constipation: Secondary | ICD-10-CM

## 2016-06-02 DIAGNOSIS — K5900 Constipation, unspecified: Secondary | ICD-10-CM

## 2016-06-02 DIAGNOSIS — K567 Ileus, unspecified: Secondary | ICD-10-CM | POA: Insufficient documentation

## 2016-06-02 DIAGNOSIS — S32038S Other fracture of third lumbar vertebra, sequela: Secondary | ICD-10-CM

## 2016-06-02 DIAGNOSIS — E1165 Type 2 diabetes mellitus with hyperglycemia: Secondary | ICD-10-CM

## 2016-06-02 DIAGNOSIS — S32009A Unspecified fracture of unspecified lumbar vertebra, initial encounter for closed fracture: Secondary | ICD-10-CM

## 2016-06-02 DIAGNOSIS — I10 Essential (primary) hypertension: Secondary | ICD-10-CM

## 2016-06-02 LAB — GLUCOSE, CAPILLARY
GLUCOSE-CAPILLARY: 161 mg/dL — AB (ref 65–99)
Glucose-Capillary: 142 mg/dL — ABNORMAL HIGH (ref 65–99)
Glucose-Capillary: 194 mg/dL — ABNORMAL HIGH (ref 65–99)

## 2016-06-02 MED ORDER — GLIMEPIRIDE 2 MG PO TABS
1.0000 mg | ORAL_TABLET | Freq: Every day | ORAL | Status: DC
  Administered 2016-06-02 – 2016-06-04 (×3): 1 mg via ORAL
  Filled 2016-06-02 (×3): qty 1

## 2016-06-02 NOTE — Progress Notes (Signed)
Physical Therapy Session Note  Patient Details  Name: Corey Sellers MRN: [id number] Date of Birth: 1951-08-31  Today's Date: 06/02/2016 PT Individual Time: 4210-3128 PT Individual Time Calculation (min): 26 min    Short Term Goals:Week 1:  PT Short Term Goal 1 (Week 1): Patient will perform bed mobility with supervision A and flat bed.  PT Short Term Goal 2 (Week 1): Patient will transfer to [facility] with stand pivot/squat pivot with supervision A.  PT Short Term Goal 3 (Week 1): Patient will initiate gait training with LRAD PT Short Term Goal 4 (Week 1): Patient will initiate stair training for access into house.   Skilled Therapeutic Interventions/Progress Updates:  Pt received resting in w/c, c/o "pain all over" and requesting to return to bed.  RN notified for pain medication.  Sit<>stand from w/c with steady assist and verbal cues for sequencing, hand placement, and weight bearing restriction.  Stand/pivot with small hops from w/c to EOB with pt maintaining NWB on RLE throughout 75% of the transfer.  Dynamic sitting balance EOB to doff pants, pt able to complete with increased time and supervision.  Sit>supine through side lying with mod assist for BLEs into bed.  Total assist to remove TLSO but pt able to roll using bed rails with supervision.  Increased time for pt to doff shirt and PT provided hospital gown which pt donned without help.  Educated pt on tomorrow's schedule and plan for therapy to initiate gait training.  Pt verbalized understanding.  Left semi-reclined in bed with call bell in reach and needs met.      Therapy Documentation Precautions:  Precautions Precautions: Back, Fall Precaution Booklet Issued: No Precaution Comments: Patient unable to recall back precautions.  Required Braces or Orthoses: Spinal Brace Spinal Brace: Thoracolumbosacral orthotic, Applied in supine position Restrictions Weight Bearing Restrictions: Yes RLE Weight Bearing: Non weight bearing LLE Weight  Bearing: Weight bearing as tolerated Other Position/Activity Restrictions: Pt could maintain NWB on RLE with 2 person assist   See Function Navigator for Current Functional Status.   Therapy/Group: Individual Therapy  Earnest Conroy Penven-Crew 06/02/2016, 4:22 PM

## 2016-06-02 NOTE — Progress Notes (Signed)
Social Work Assessment and Plan  Patient Details  Name: Corey Sellers MRN: [id number] Date of Birth: [birth year]  Today's Date: 06/02/2016  Problem List:  Patient Active Problem List   Diagnosis Date Noted  . Ileus ([place])   . Constipation   . Controlled type 2 diabetes mellitus with hyperglycemia, without long-term current use of insulin ([place])   . Closed fracture of lumbar vertebra ([place])   . Trauma 05/30/2016  . Fall 05/28/2016  . Multiple pelvic fractures ([place]) 05/28/2016  . Acute blood loss anemia 05/28/2016  . L3 vertebral fracture ([place]) 05/28/2016  . Urinary retention 05/28/2016  . Pelvic fracture ([place])   . Surgery, elective   . Benign essential HTN   . Diabetes mellitus type 2 without retinopathy ([place])   . Post-operative pain   . SIRS (systemic inflammatory response syndrome) (HCC)   . Thrombocytopenia ([place])   . Pelvic hematoma in male 05/24/2016   Past Medical History:  Past Medical History:  Diagnosis Date  . Diabetes mellitus   . GERD (gastroesophageal reflux disease)   . Hypertension   . 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 Wears glasses    Past Surgical History:  Past Surgical History:  Procedure Laterality Date  . APPENDECTOMY    . BASAL CELL CARCINOMA EXCISION     left chin  . EXCISION METACARPAL MASS Right 05/23/2013   Procedure: RIGHT LONG EXCISION ANNULAR LIGAMENT CYST;  Surgeon: Tennis Must, MD;  Location: [place];  Service: Orthopedics;  Laterality: Right;  . GANGLION CYST EXCISION Left 06/06/2013   Procedure: LEFT WRIST EXCISION MASS;  Surgeon: Tennis Must, MD;  Location: [place];  Service: Orthopedics;  Laterality: Left;  . IR GENERIC HISTORICAL  05/24/2016   IR ANGIOGRAM PELVIS SELECTIVE OR SUPRASELECTIVE 05/24/2016 Arne Cleveland, MD [facility]  . IR GENERIC HISTORICAL  05/24/2016   IR US GUIDE VASC ACCESS RIGHT 05/24/2016 Arne Cleveland, MD [facility]  . IR  GENERIC HISTORICAL  05/24/2016   IR EMBO ART  VEN HEMORR LYMPH EXTRAV  INC GUIDE ROADMAPPING 05/24/2016 Arne Cleveland, MD [facility]  . JOINT REPLACEMENT  2003,2004   Rt shoulder  . ORIF PELVIC FRACTURE N/A 05/27/2016   Procedure: OPEN REDUCTION INTERNAL FIXATION (ORIF) SYMPHYSIS PUBIS W/ TRANSSACRAL SCREWS;  Surgeon: Altamese Box Elder, MD;  Location: [facility];  Service: Orthopedics;  Laterality: N/A;  . PAROTIDECTOMY W/ NECK DISSECTION TOTAL  2013   left parotidectomy-26 nodes removed  . TONSILLECTOMY    . TRIGGER FINGER RELEASE Right 05/23/2013   Procedure: RELEASE TRIGGER FINGER/A-1 PULLEY RIGHT INDEX AND LONG;  Surgeon: Tennis Must, MD;  Location: [place];  Service: Orthopedics;  Laterality: Right;  . TRIGGER FINGER RELEASE Left 06/06/2013   Procedure: RELEASE TRIGGER FINGER/A-1 PULLEY LEFT SMALL;  Surgeon: Tennis Must, MD;  Location: [facility];  Service: Orthopedics;  Laterality: Left;  Marland Kitchen VARICOCELE EXCISION     left   Social History:  reports that he quit smoking about 17 years ago. He has never used smokeless tobacco. He reports that he drinks alcohol. He reports that he does not use drugs.  Family / Support Systems Marital Status: Married How Long?: 38 years Patient Roles: Spouse, Parent (has two dtrs; three grandchildren) Spouse/Significant Other: Cathrine Muster - wife - [phone] Children: April Murphy - dtr - [phone] Ability/Limitations of Caregiver: Dtr 5 mins away and can  check on patient.  She does have a 64 y/o and fibromyalgia.  Wife works 9 am to 6 pm [employer]. Caregiver Availability: Intermittent Family Dynamics: Pt stated that dtr will help as much as her 64 y/o will allow.  Pt reports wife will help when she's able.  Social History Preferred language: English Religion: None Education: Surveyor, minerals - Arts administrator Read: Yes Write: Yes Employment Status: Employed Name of Employer: Nurse, learning disability of Employment:  26 Return to Work Plans: Pt would like to return to work when he is able. Legal History/Current Legal Issues: none reported Guardian/Conservator: N/A - MD has determined that pt is able to make his own decisions.   Abuse/Neglect Physical Abuse: Denies Verbal Abuse: Denies Sexual Abuse: Denies Exploitation of patient/patient's resources: Denies Self-Neglect: Denies  Emotional Status Pt's affect, behavior and adjustment status: Pt was somewhat frustrated during CSW's visit as he had just tried to order his meal and had dietary restrictions he didn't agree with and he's upset about his injury. Recent Psychosocial Issues: Pt is concerned about the condition of his home and has been overwhelmed by it for a while and now he knows he can't do anything about it.  Pt reports having some trouble with sleeping. Psychiatric History: none reported Substance Abuse History: none reported  Patient / Family Perceptions, Expectations & Goals Pt/Family understanding of illness & functional limitations: Pt has a good understanding of his condition and limitations.  Wife was not present, so could not assess her understanding.  Will continue to monitor. Premorbid pt/family roles/activities: Pt works fulltime and shows dogs. Anticipated changes in roles/activities/participation: He realizes he will not be able to do these things right away, but hopes to get back to them eventually. Pt/family expectations/goals: Pt wants to learn how to move again and get moving.  Community Resources Express Scripts: None Premorbid Home Care/DME Agencies: None Transportation available at discharge: family Resource referrals recommended: Neuropsychology  Discharge Planning Living Arrangements: Spouse/significant other Support Systems: Spouse/significant other, Children Type of Residence: Private residence Insurance Resources: Multimedia programmer (specify) ([employer] [place] [employer] [place]) Museum/gallery curator Resources:  Employment Museum/gallery curator Screen Referred: No Money Management: Patient, Spouse Does the patient have any problems obtaining your medications?: No Home Management: Pt's wife can manage inside of home.  Pt has not been able to manage the outside of his home for a while.  He's been overwhelmed. Patient/Family Preliminary Plans: Pt plans to go home with his family to provide intermittent supervision and assistance Barriers to Discharge: Steps Social Work Anticipated Follow Up Needs: HH/OP Expected length of stay: 12-17 days  Clinical Impression CSW met with pt to introduce self and role of CSW, as well as to complete assessment.  Pt had just met with the nutrition ambassador and was frustrated that the carb modified diet and that he can't have gravy.  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  CSW informed nurse and she will talk with PA about this.  Pt is also upset that this happened to him while working on a [employer] home with his co-workers.  Pt reports he has completed what employer needs over the phone, but that he will let CSW know if he needs MD to complete anything.  Wife was not present as she is working today.  CSW will try to contact her by phone or meet her when she visits.  She will assist pt when  she is home and dtr lives 5 minutes away and will help as she can.  Pt wants to get moving again.  He reports some sleep disruption, waking up and pretending he's drinking something, but isn't.  He does not report any PTSD symptoms from the fall, but CSW will monitor and refer to neuropsychology as needed.  CSW will continue to follow pt and assist as needed.  Shaine Newmark, Silvestre Mesi 06/02/2016, 11:23 AM

## 2016-06-02 NOTE — Progress Notes (Signed)
Occupational Therapy Session Note  Patient Details  Name: Corey Sellers MRN: [id number] Date of Birth: Dec 29, 1950  Today's Date: 06/02/2016 OT Individual Time: [id number] and [id number] and [id number] OT Individual Time Calculation (min): 30 min and 62 min and 32 min     Short Term Goals: Week 1:  OT Short Term Goal 1 (Week 1): Complete toileting using AE with min assist OT Short Term Goal 2 (Week 1): Bathe sitting using lateral leans to wash buttocks and AE to extend reach with mod assist OT Short Term Goal 3 (Week 1): Don TLSO with min assist while supine OT Short Term Goal 4 (Week 1): Dress lower body using AE, prn, with mod assist OT Short Term Goal 5 (Week 1): Transfer to [facility] with steadying assist  Skilled Therapeutic Interventions/Progress Updates:   Pt participated in skilled OT session focusing on activity tolerance, adherence to WB and spinal precautions, and carryover of appropriate use of AE during dressing completion. Pt completed UB dressing bedlevel via logroll technique with min vcs. Orthosis donned with Max A. Supine<sit completed with extra time and steadying assistance. Pt completed LB dressing with use of reacher and sock aide. Pt required assistance to lift pants over hips while maintaining R LE NWB precautions. With therapists foot under R heel, pt began to WB with cues to weight shift. Due to difficultly with maintaining precautions when standing for LB dressing, pt would benefit from improving lateral leaning abilities. Pt was educated on preferred bathing techniques for carryover for home (w/c level, EOB/bedlevel) and pt reported preferring EOB/bedlevel at this time. Pt returned back to bed, orthosis doffed. Pt reported wanting to rest before having therapy again in an hour. All needs within reach. No c/o pain.   2nd Session 1:1 Tx (60 minutes) Pt participated in skilled OT session focusing on activity tolerance, adherence to WB/spinal precautions, and safety awareness. Pt  completed bathing at EOB and bedlevel as needed with use of LH sponge. Lateral leaning utilized for pericare with pt requiring assistance for thoroughness. After LB bathing, pt lifted pants over hips and completed UB ADLs bedlevel via logroll technique. Cues required for rest breaks and maintaining precautions. At end of tx session, orthosis was doffed for pt to eat lunch in bed per request. Pt was left with all needs within reach and 3 bedrails up at time of departure.  No c/o pain.   3rd Session 1:1 Tx (32 minutes) Pt participated in skilled OT tx focusing on lateral leaning, activity tolerance, and adherence to precautions during ADL task completion. After orthosis was donned in supine, pt completed lateral leaning activity with cues for pronounced weight shift at EOB with extra time and recovery periods. Pt educated on purpose of activity completion to improve abilities with toileting and LB dressing with verbalized understanding. Afterwards pt transferred to w/c stand pivot with RW and pts R leg bent for WB adherence. Hairwashing completed w/c level with Min A at sink via adaptive tech to avoid arching. Pt was left in w/c with all needs within reach at end of session. Pt reported that scrotal pain was manageable during session with provided rest breaks.   Therapy Documentation Precautions:  Precautions Precautions: Back, Fall Precaution Booklet Issued: No Precaution Comments: Patient unable to recall back precautions.  Required Braces or Orthoses: Spinal Brace Spinal Brace: Thoracolumbosacral orthotic, Applied in supine position Restrictions Weight Bearing Restrictions: Yes RLE Weight Bearing: Non weight bearing LLE Weight Bearing: Weight bearing as tolerated Other Position/Activity Restrictions: Pt could maintain  NWB on RLE with 2 person assist   ADL: ADL ADL Comments: see Functional Assessment Exercises:   Other Treatments:    See Function Navigator for Current Functional  Status.   Therapy/Group: Individual Therapy  Governor Matos A Purl Claytor 06/02/2016, 12:33 PM

## 2016-06-02 NOTE — Progress Notes (Signed)
Physical Therapy Session Note  Patient Details  Name: Corey Sellers MRN: [id number] Date of Birth: 1951/05/17  Today's Date: 06/02/2016 PT Individual Time: 1005-1100 PT Individual Time Calculation (min): 55 min    Short Term Goals: Week 1:  PT Short Term Goal 1 (Week 1): Patient will perform bed mobility with supervision A and flat bed.  PT Short Term Goal 2 (Week 1): Patient will transfer to [facility] with stand pivot/squat pivot with supervision A.  PT Short Term Goal 3 (Week 1): Patient will initiate gait training with LRAD PT Short Term Goal 4 (Week 1): Patient will initiate stair training for access into house.   Skilled Therapeutic Interventions/Progress Updates:    Pt received resting in bed, reporting 9/10 pain but agreeable to therapy if can stop by nursing station for pain medication on the way to the gym.  TLSO donned total assist in supine with pt rolling R and L with min assist for RLE management.  Supine>sit using log roll with min assist and able to scoot to EOB with supervision.  Squat/pivot to w/c with multiple lifts mod assist from therapist and mod multimodal cues for sequencing and weight bearing restriction on RLE.  Pt propels w/c to therapy gym and PT switched out chair for high back chair for improved support and sitting tolerance.  Pt able to perform stand/pivot with RW and min assist for balance and able to maintain NWB on RLE with min cues.  PT instructed pt in BLE therex x10 reps LAQ with 3 second hold and seated hip flexion focus on eccentric control.  Pt propelled w/c back to room at end of session and positioned upright in w/c with call bell in reach and needs met.  Encouraged to sit up as much as tolerated during the day to reduce risk of PNA and improve overall activity tolerance and SOB.  Pt agreeable.    Therapy Documentation Precautions:  Precautions Precautions: Back, Fall Precaution Booklet Issued: No Precaution Comments: Patient unable to recall back precautions.   Required Braces or Orthoses: Spinal Brace Spinal Brace: Thoracolumbosacral orthotic, Applied in supine position Restrictions Weight Bearing Restrictions: Yes RLE Weight Bearing: Non weight bearing LLE Weight Bearing: Weight bearing as tolerated Other Position/Activity Restrictions: Pt could maintain NWB on RLE with 2 person assist   See Function Navigator for Current Functional Status.   Therapy/Group: Individual Therapy  Vincenta Steffey E Penven-Crew 06/02/2016, 10:48 AM

## 2016-06-02 NOTE — Plan of Care (Signed)
Problem: RH BLADDER ELIMINATION Goal: RH STG MANAGE BLADDER WITH ASSISTANCE STG Manage Bladder With Assistance  Outcome: Not Progressing Foley cath in use due to inability to void

## 2016-06-02 NOTE — IPOC Note (Addendum)
Overall Plan of Care (IPOC) Patient Details Name: Corey Sellers MRN: [id number] DOB: Mar 02, 1951  Admitting Diagnosis: polytrauma  Hospital Problems: Principal Problem:   Trauma Active Problems:   Multiple pelvic fractures (HCC)   Acute blood loss anemia   Urinary retention   Post-operative pain   Ileus ([place])   Constipation   Controlled type 2 diabetes mellitus with hyperglycemia, without long-term current use of insulin (HCC)   Closed fracture of lumbar vertebra (HCC)     Functional Problem List: Nursing Pain, Bowel, Edema, Endurance, Medication Management, Skin Integrity, Motor  PT Balance, Motor, Pain, Endurance, Safety  OT Balance, Edema, Endurance, Pain, Safety  SLP    TR         Basic ADL's: OT Bathing, Dressing, Toileting     Advanced  ADL's: OT       Transfers: PT Bed Mobility, Bed to Chair, Car, Floor, Manufacturing systems engineer, Metallurgist: PT Ambulation, Emergency planning/management officer, Stairs     Additional Impairments: OT    SLP        TR      Anticipated Outcomes Item Anticipated Outcome  Self Feeding I  Swallowing      Basic self-care  Min A  Toileting  Mod I   Bathroom Transfers Mod I  Bowel/Bladder  bowel and bladder managed at mod I level  Transfers  Mod I with LRAD for basic transfers.   Locomotion  Mod I WC.   Communication     Cognition     Pain  pain at or below level 5 with scheduled and prn medication  Safety/Judgment  maintain safety with cues/supervision   Therapy Plan: PT Intensity: Minimum of 1-2 x/day ,45 to 90 minutes PT Frequency: 5 out of 7 days PT Duration Estimated Length of Stay: 12-16days  OT Intensity: Minimum of 1-2 x/day, 45 to 90 minutes OT Frequency: 5 out of 7 days OT Duration/Estimated Length of Stay: 10-14 DAYS         Team Interventions: Nursing Interventions Patient/Family Education, Disease Management/Prevention, Skin Care/Wound Management, Discharge Planning, Pain Management, Psychosocial  Support, Bowel Management, Medication Management  PT interventions Ambulation/gait training, Balance/vestibular training, Cognitive remediation/compensation, Community reintegration, Discharge planning, Functional mobility training, Disease management/prevention, DME/adaptive equipment instruction, Neuromuscular re-education, Pain management, Patient/family education, Psychosocial support, Skin care/wound management, Splinting/orthotics, Stair training, Therapeutic Activities, Therapeutic Exercise, UE/LE Strength taining/ROM, UE/LE Coordination activities, Visual/perceptual remediation/compensation, Wheelchair propulsion/positioning  OT Interventions DME/adaptive equipment instruction, Functional mobility training, Pain management, Patient/family education, Therapeutic Activities, Therapeutic Exercise, Discharge planning, Training and development officer, Wheelchair propulsion/positioning  SLP Interventions    TR Interventions    SW/CM Interventions Discharge Planning, Barrister's clerk, Patient/Family Education    Team Discharge Planning: Destination: PT-Home ,OT- Home , SLP-  Projected Follow-up: PT-Home health PT, OT-  None, SLP-  Projected Equipment Needs: PT-Wheelchair cushion (measurements), Wheelchair (measurements), Rolling walker with 5" wheels, OT- To be determined, SLP-  Equipment Details: PT- , OT-Pt and spouse mildly argumentative regarding patient's expressed preference to reside at upper level of home versus lower level. Patient/family involved in discharge planning: PT- Patient,  OT- , SLP-   MD ELOS: 10-14 days. Medical Rehab Prognosis:  Excellent Assessment:  65 y.o.white male with history of T2DM, HTN, melanomawho sustained a fall off 8 feet off a scaffolding (working on [employer]) on 05/24/2016 onto his back and had immediate onset of back and pelvic pain with inability to walk. He was noted to be hypotensive and was found to have pelvic  symphysis diastasis with large pelvic  hematoma with active extravasation, right sacral wing fracture, superior endplate fracture deformity L3 vertebra. He was taken to IR emergently for iliac artery embolization. Dr. Ronnald Ramp recommended LSO for L3 fracture--to be donned when supine and follow up repeat films in 2 weeks to monitor for stability. Dr. Marcelino Scot consulted for input due to complexity of pelvic fracture and patient underwent ORIF anterior pubic symphysis with transsacral screw fixation right and left.  NWB RLE for 8 weeks. He had had significant scrotal edema with difficulty voiding flomax added to help with symptoms. Has had abdominal distension with constipation, fevers as well as ABLA. Pt with functional deficits in mobility, transfers, toileting and LE dressing. Will set goals for Min A with self-care, otherwise Mod I at wheelchair level.      See Team Conference Notes for weekly updates to the plan of care

## 2016-06-02 NOTE — Progress Notes (Signed)
Inpatient [place] [facility] Individual Statement of Services  Patient Name:  Corey Sellers  Date:  06/02/2016  Welcome to the [facility].  Our goal is to provide you with an individualized program based on your diagnosis and situation, designed to meet your specific needs.  With this comprehensive rehabilitation program, you will be expected to participate in at least 3 hours of rehabilitation therapies Monday-Friday, with modified therapy programming on the weekends.  Your rehabilitation program will include the following services:  Physical Therapy (PT), Occupational Therapy (OT), 24 hour per day rehabilitation nursing, Neuropsychology, Case Management (Social Worker), Rehabilitation Medicine, Nutrition Services and Pharmacy Services  Weekly team conferences will be held on Wednesdays to discuss your progress.  Your Social Worker will talk with you frequently to get your input and to update you on team discussions.  Team conferences with you and your family in attendance may also be held.  Expected length of stay: 12 to 16 days  Overall anticipated outcome: Modified independent from the wheelchair; supervision with tub transfers and upper body dressing, car transfers, and meal preparation; minimal assistance with ambulation, stairs, bathing  Depending on your progress and recovery, your program may change. Your Social Worker will coordinate services and will keep you informed of any changes. Your Social Worker's name and contact numbers are listed  below.  The following services may also be recommended but are not provided by the [facility] will be made to provide these services after discharge if needed.  Arrangements include referral to agencies that provide these services.  Your insurance has been  verified to be:  [employer] [place] Your primary doctor is:  Dr. Joneen Caraway  Pertinent information will be shared with your doctor and your insurance company.  Social Worker:  Alfonse Alpers, LCSW  [phone] or ([phone]  Information discussed with and copy given to patient by: Trey Sailors, 06/02/2016, 11:35 AM

## 2016-06-02 NOTE — Progress Notes (Addendum)
[facility] PHYSICAL MEDICINE & REHABILITATION     PROGRESS NOTE    Subjective/Complaints: Pt laying in bed this AM.  He states he slept fairly, but is flat.  He states he has moved his bowels twice.   ROS: Denies CP, SOB, N/V/D.   Objective: Vital Signs: Blood pressure (!) 136/55, pulse 93, temperature 98.8 F (37.1 C), temperature source Oral, resp. rate 18, height 5\' 9"  (1.753 m), weight 111.8 kg (246 lb 6.4 oz), SpO2 91 %. Dg Abd Portable 1v  Result Date: 05/31/2016 CLINICAL DATA:  Ileus, abdominal distention EXAM: PORTABLE ABDOMEN - 1 VIEW COMPARISON:  05/27/2016 FINDINGS: Nonobstructive bowel gas pattern. No gaseous distention to suggest adynamic ileus. Moderate colonic stool burden. Postoperative changes involving the superior pubic rami/pubic symphysis and bilateral sacroiliac joints. IMPRESSION: Moderate colonic stool burden, raising the possibility of constipation. No evidence of bowel obstruction or adynamic ileus. Electronically Signed   By: Corey Sellers M.D.   On: 05/31/2016 10:39    Recent Labs  05/30/16 1010 05/31/16 0503  WBC 6.9 6.6  HGB 7.8* 8.0*  HCT 24.6* 24.3*  PLT 180 199    Recent Labs  05/31/16 0503  NA 133*  K 4.0  CL 97*  GLUCOSE 153*  BUN 14  CREATININE 0.91  CALCIUM 8.9   CBG (last 3)   Recent Labs  06/01/16 1639 06/01/16 2027 06/02/16 0625  GLUCAP 156* 153* 142*    Wt Readings from Last 3 Encounters:  05/31/16 111.8 kg (246 lb 6.4 oz)  05/24/16 112 kg (247 lb)  12/28/13 106.1 kg (234 lb)    Physical Exam:  Constitutional: He appears well-developed and well-nourished.  NAD.  HENT: Normocephalic and atraumatic.  Eyes: Conjunctivae and EOM are normal.  Cardiovascular: Normal rate and regular rhythm.  Exam reveals no gallop and no friction rub.  No murmur heard. Respiratory: Effort normal and breath sounds normal. No stridor. No respiratory distress. He has no wheezes.  GI: Normal appearance and bowel sounds are present.  There is no tenderness. Lower abdominal wound with dry surgical dressing in place.  Musculoskeletal: He exhibits edema. He exhibits no tenderness.  Neurological: He is alert and oriented.  Speech clear.  Able to follow basic commands without difficulty.  UE strength grossly 5/5.  RLE: 4-/5HF , 3/5 KE and 4+/5 ADF/PF.  LLE: 4-/5HF , 3/5 KE and 4+/5 ADF/PF.  No sensory changes.  Skin: Skin is warm and dry. Hypogastric incision intact with sutures, no discharge  Psychiatric: He has a flat. mood and affect. His behavior is normal. Thought content normal.    Assessment/Plan: 1. Mobility and functional deficits secondary to polytrauma which require 3+ hours per day of interdisciplinary therapy in a comprehensive inpatient rehab setting. Physiatrist is providing close team supervision and 24 hour management of active medical problems listed below. Physiatrist and rehab team continue to assess barriers to discharge/monitor patient progress toward functional and medical goals.  Function:  Bathing Bathing position   Position:  (UB ADLs bedlevel and LB ADLs w/c level at sink)  Bathing parts Body parts bathed by patient: Right arm, Left arm, Right upper leg, Left upper leg, Right lower leg, Left lower leg, Chest, Abdomen, Front perineal area Body parts bathed by helper: Back, Buttocks  Bathing assist Assist Level: Touching or steadying assistance(Pt > 75%)      Upper Body Dressing/Undressing Upper body dressing   What is the patient wearing?: Orthosis     Pull over shirt/dress - Perfomed by patient: Thread/unthread right  sleeve, Thread/unthread left sleeve, Put head through opening, Pull shirt over trunk       Orthosis activity level: Performed by helper  Upper body assist Assist Level: Touching or steadying assistance(Pt > 75%)      Lower Body Dressing/Undressing Lower body dressing   What is the patient wearing?: Non-skid slipper socks         Non-skid slipper socks- Performed  by patient: Don/doff right sock, Don/doff left sock Non-skid slipper socks- Performed by helper: Don/doff right sock, Don/doff left sock                  Lower body assist Assist for lower body dressing: Touching or steadying assistance (Pt > 75%)      Toileting Toileting Toileting activity did not occur: Refused        Toileting assist     Transfers Chair/bed transfer   Chair/bed transfer method: Ambulatory Chair/bed transfer assist level: Moderate assist (Pt 50 - 74%/lift or lower) Chair/bed transfer assistive device: Armrests, Medical sales representative Ambulation activity did not occur: Safety/medical concerns         Wheelchair   Type: Manual Max wheelchair distance: 134ft  Assist Level: Touching or steadying assistance (Pt > 75%)  Cognition Comprehension Comprehension assist level: Follows basic conversation/direction with no assist  Expression Expression assist level: Expresses basic needs/ideas: With no assist  Social Interaction Social Interaction assist level: Interacts appropriately with others - No medications needed.  Problem Solving Problem solving assist level: Solves basic problems with no assist  Memory Memory assist level: More than reasonable amount of time   Medical Problem List and Plan: 1.  Mobility and functional deficits secondary to polytrauma as above  -continue CIR therapies 2.  DVT Prophylaxis/Anticoagulation: Pharmaceutical: Lovenox  Dopplers neg for DVT on 9/3 3. Pain Management: Oxycodone prn effective.  4. Mood: LCSW to follow for evaluation and support.  5. Neuropsych: This patient is capable of making decisions on his own behalf. 6. Skin/Wound Care: Monitor wound of healing.  7. Fluids/Electrolytes/Nutrition: Monitor I/O. Encourage po.  8. L3 endplate fracture: Back precautions --don LSO when patient supine 9. ABLA:   Hb 8.0 on 9/2  Cont to monitor  10. Fevers:   Encourage IS  -UA negative, UCX negative  Afebrile x48  hours, cont to monitor 11.  Urinary retention:  Likely due to pelvic hematoma, immobility, constipation.   -continue flomax.  -hold urecholine  -will d/c foley today 12. T2DM: Monitor BS ac/hs.   On metformin  Will start Amaryl 1mg  on 9/4  Use SSI for elevated BS.  13. HTN: Will monitor BP bid  Continue avapro, norvasc and HCTZ.   Monitor with increased activity 14. Constipation:  Improved  miralax bid.   -some results with miralax and dulcolax supp yesterday   -constipation on KUB  LOS (Days) 3 A FACE TO FACE EVALUATION WAS PERFORMED  Corey Sellers Lorie Phenix 06/02/2016 8:04 AM

## 2016-06-03 ENCOUNTER — Inpatient Hospital Stay (HOSPITAL_COMMUNITY): Payer: BLUE CROSS/BLUE SHIELD | Admitting: Occupational Therapy

## 2016-06-03 ENCOUNTER — Inpatient Hospital Stay (HOSPITAL_COMMUNITY): Payer: Self-pay | Admitting: Physical Therapy

## 2016-06-03 ENCOUNTER — Inpatient Hospital Stay (HOSPITAL_COMMUNITY): Payer: Self-pay | Admitting: Occupational Therapy

## 2016-06-03 DIAGNOSIS — K5901 Slow transit constipation: Secondary | ICD-10-CM

## 2016-06-03 LAB — GLUCOSE, CAPILLARY
GLUCOSE-CAPILLARY: 155 mg/dL — AB (ref 65–99)
GLUCOSE-CAPILLARY: 155 mg/dL — AB (ref 65–99)
GLUCOSE-CAPILLARY: 149 mg/dL — AB (ref 65–99)
Glucose-Capillary: 123 mg/dL — ABNORMAL HIGH (ref 65–99)
Glucose-Capillary: 146 mg/dL — ABNORMAL HIGH (ref 65–99)

## 2016-06-03 MED ORDER — GLUCERNA SHAKE PO LIQD
237.0000 mL | Freq: Two times a day (BID) | ORAL | Status: DC
  Administered 2016-06-03 – 2016-06-07 (×9): 237 mL via ORAL

## 2016-06-03 MED ORDER — SENNOSIDES-DOCUSATE SODIUM 8.6-50 MG PO TABS
1.0000 | ORAL_TABLET | Freq: Two times a day (BID) | ORAL | Status: DC
  Administered 2016-06-03 – 2016-06-10 (×11): 1 via ORAL
  Filled 2016-06-03 (×17): qty 1

## 2016-06-03 MED ORDER — BETHANECHOL CHLORIDE 10 MG PO TABS
10.0000 mg | ORAL_TABLET | Freq: Three times a day (TID) | ORAL | Status: DC
  Administered 2016-06-03 – 2016-06-04 (×3): 10 mg via ORAL
  Filled 2016-06-03 (×3): qty 1

## 2016-06-03 NOTE — Plan of Care (Signed)
Problem: RH Balance Goal: LTG Patient will maintain dynamic standing balance (PT) LTG:  Patient will maintain dynamic standing balance with assistance during mobility activities (PT)  Outcome: Not Applicable Date Met: 38/33/38 Pt with orders for transfers only

## 2016-06-03 NOTE — Progress Notes (Signed)
Physical Therapy Session Note  Patient Details  Name: Corey Sellers MRN: [id number] Date of Birth: 30-Oct-1950  Today's Date: 06/03/2016 PT Individual Time: 1415-1515 PT Individual Time Calculation (min): 60 min    Short Term Goals: Week 1:  PT Short Term Goal 1 (Week 1): Patient will perform bed mobility with supervision A and flat bed.  PT Short Term Goal 2 (Week 1): Patient will transfer to [facility] with stand pivot/squat pivot with supervision A.  PT Short Term Goal 3 (Week 1): Patient will initiate gait training with LRAD PT Short Term Goal 4 (Week 1): Patient will initiate stair training for access into house.   Skilled Therapeutic Interventions/Progress Updates:    Pt received resting in w/c in family room, c/o 6/10 pain but agreeable to therapy session.  Session focus on w/c propulsion, activity tolerance, and d/c planning.  Pt propelled w/c to [employer]	[place] with short rest breaks as needed with supervision overall.  Able to propel up/down incline without physical assist from therapist.  PT and rec therapist discussed d/c planning with pt in regards to home entry, bathroom accessibility, etc since pt will need to use w/c for primary mobility given LE weight bearing restrictions and activity restrictions.  Pt returned to room and requesting to return to bed and change into hospital gown.  PT instructed pt in squat pivot from w/c to bed with steady assist and mod verbal cues for sequencing.  Pt able to doff shirt and shorts with increased time and don hospital gown with set up assist.  PT removed TLSO total assist.  Pt left semi reclined with call bell in reach and needs met.   Therapy Documentation Precautions:  Precautions Precautions: Back, Fall Precaution Booklet Issued: No Precaution Comments: Patient unable to recall back precautions.  Required Braces or Orthoses: Spinal Brace Spinal Brace: Thoracolumbosacral orthotic, Applied in supine position Restrictions Weight Bearing Restrictions:  Yes RLE Weight Bearing: Non weight bearing LLE Weight Bearing: Weight bearing as tolerated (for transfers only ) Other Position/Activity Restrictions: Pt could maintain NWB on RLE with 2 person assist   See Function Navigator for Current Functional Status.   Therapy/Group: Individual Therapy  Shreyansh Tiffany E Penven-Crew 06/03/2016, 3:20 PM

## 2016-06-03 NOTE — Plan of Care (Signed)
Problem: RH Ambulation Goal: LTG Patient will ambulate in controlled environment (PT) LTG: Patient will ambulate in a controlled environment, # of feet with assistance (PT).  Outcome: Not Applicable Date Met: 59/47/07 Pt with orders for transfers only  Goal: LTG Patient will ambulate in home environment (PT) LTG: Patient will ambulate in home environment, # of feet with assistance (PT).  Outcome: Not Applicable Date Met: 61/51/83 Pt with orders for transfers only   Problem: RH Stairs Goal: LTG Patient will ambulate up and down stairs w/assist (PT) LTG: Patient will ambulate up and down # of stairs with assistance (PT)  Outcome: Not Applicable Date Met: 43/73/57 Pt with orders for transfers only

## 2016-06-03 NOTE — Progress Notes (Signed)
Occupational Therapy Session Note  Patient Details  Name: Corey Sellers MRN: [id number] Date of Birth: [birth year]  Today's Date: 06/03/2016 OT Individual Time: 1345-1415 OT Individual Time Calculation (min): 30 min     Short Term Goals: Week 1:  OT Short Term Goal 1 (Week 1): Complete toileting using AE with min assist OT Short Term Goal 2 (Week 1): Bathe sitting using lateral leans to wash buttocks and AE to extend reach with mod assist OT Short Term Goal 3 (Week 1): Don TLSO with min assist while supine OT Short Term Goal 4 (Week 1): Dress lower body using AE, prn, with mod assist OT Short Term Goal 5 (Week 1): Transfer to [facility] with steadying assist  Skilled Therapeutic Interventions/Progress Updates:    1:1 Pt in bed when arrived. Donned brace in supine with pt able to roll with supervision with bed rail.  [place] precautions with min questioning cues. Pt perform stand pivot transfer with RW with min A. Reviewed pt's precautions  is transfers only and how that impacts his ability to access the bathroom at home if w/c not accessible. Pt self propelled down to the family room and fixed coffee with min A while incorporating back precautions.   Therapy Documentation Precautions:  Precautions Precautions: Back, Fall Precaution Booklet Issued: No Precaution Comments: Patient unable to recall back precautions.  Required Braces or Orthoses: Spinal Brace Spinal Brace: Thoracolumbosacral orthotic, Applied in supine position Restrictions Weight Bearing Restrictions: Yes RLE Weight Bearing: Non weight bearing LLE Weight Bearing: Weight bearing as tolerated (for transfers only ) Other Position/Activity Restrictions: Pt could maintain NWB on RLE with 2 person assist Pain:  No c/o in session  See Function Navigator for Current Functional Status.   Therapy/Group: Individual Therapy  [name] [facility] 06/03/2016, 3:51 PM

## 2016-06-03 NOTE — Progress Notes (Addendum)
[place] PHYSICAL MEDICINE & REHABILITATION     PROGRESS NOTE    Subjective/Complaints: Pt laying in bed this AM.  He complains of constipation and does not like the food.  He prefers to be on a full liquid diet as he really enjoys soups.    ROS: +Constipation. Denies CP, SOB, N/V/D.   Objective: Vital Signs: Blood pressure (!) 146/68, pulse 99, temperature 99.6 F (37.6 C), temperature source Oral, resp. rate 17, height 5\' 9"  (1.753 m), weight 111.8 kg (246 lb 6.4 oz), SpO2 95 %. No results found. No results for input(s): WBC, HGB, HCT, PLT in the last 72 hours. No results for input(s): NA, K, CL, GLUCOSE, BUN, CREATININE, CALCIUM in the last 72 hours.  Invalid input(s): CO CBG (last 3)   Recent Labs  06/02/16 1137 06/02/16 1639 06/03/16 0642  GLUCAP 161* 194* 146*    Wt Readings from Last 3 Encounters:  05/31/16 111.8 kg (246 lb 6.4 oz)  05/24/16 112 kg (247 lb)  12/28/13 106.1 kg (234 lb)    Physical Exam:  Constitutional: He appears well-developed and well-nourished.  NAD.  HENT: Normocephalic and atraumatic.  Eyes: Conjunctivae and EOM are normal.  Cardiovascular: Normal rate and regular rhythm.  Exam reveals no gallop and no friction rub.  No murmur heard. Respiratory: Effort normal and breath sounds normal. No stridor. No respiratory distress. He has no wheezes.  GI: +Distention. Normal appearance and bowel sounds are present. There is no tenderness.  Musculoskeletal: He exhibits edema. He exhibits no tenderness.  Neurological: He is alert and oriented.  Speech clear.  Able to follow basic commands without difficulty.  UE strength grossly 5/5.  RLE: 4-/5HF , 3/5 KE and 4+/5 ADF/PF.  LLE: 4-/5HF , 3/5 KE and 4+/5 ADF/PF.  No sensory changes.  Skin: Skin is warm and dry.  Hypogastric incision intact with sutures, c/d/i. Right hip incision with suture c/d/i. Psychiatric: He has a flat. mood and affect. His behavior is normal. Thought content normal.     Assessment/Plan: 1. Mobility and functional deficits secondary to polytrauma which require 3+ hours per day of interdisciplinary therapy in a comprehensive inpatient rehab setting. Physiatrist is providing close team supervision and 24 hour management of active medical problems listed below. Physiatrist and rehab team continue to assess barriers to discharge/monitor patient progress toward functional and medical goals.  Function:  Bathing Bathing position   Position: Other (comment) (EOB for LB ADLs and bedlevel for UB ADLs)  Bathing parts Body parts bathed by patient: Right arm, Left arm, Chest, Abdomen, Front perineal area, Right upper leg, Left upper leg, Right lower leg, Left lower leg, Back Body parts bathed by helper: Buttocks  Bathing assist Assist Level: Touching or steadying assistance(Pt > 75%)      Upper Body Dressing/Undressing Upper body dressing   What is the patient wearing?: Pull over shirt/dress, Orthosis     Pull over shirt/dress - Perfomed by patient: Thread/unthread right sleeve, Thread/unthread left sleeve, Put head through opening, Pull shirt over trunk       Orthosis activity level: Performed by helper  Upper body assist Assist Level: Touching or steadying assistance(Pt > 75%)      Lower Body Dressing/Undressing Lower body dressing   What is the patient wearing?: Pants, Non-skid slipper socks     Pants- Performed by patient: Thread/unthread right pants leg, Thread/unthread left pants leg, Pull pants up/down   Non-skid slipper socks- Performed by patient: Don/doff right sock, Don/doff left sock Non-skid slipper socks-  Performed by helper: Don/doff right sock, Don/doff left sock                  Lower body assist Assist for lower body dressing: Touching or steadying assistance (Pt > 75%)      Toileting Toileting Toileting activity did not occur: Refused        Toileting assist     Transfers Chair/bed transfer   Chair/bed transfer  method: Stand pivot Chair/bed transfer assist level: Touching or steadying assistance (Pt > 75%) Chair/bed transfer assistive device: Armrests, Medical sales representative Ambulation activity did not occur: Safety/medical concerns         Wheelchair   Type: Manual Max wheelchair distance: 117ft  Assist Level: Supervision or verbal cues  Cognition Comprehension Comprehension assist level: Follows basic conversation/direction with no assist  Expression Expression assist level: Expresses basic needs/ideas: With no assist  Social Interaction Social Interaction assist level: Interacts appropriately with others - No medications needed.  Problem Solving Problem solving assist level: Solves basic problems with no assist  Memory Memory assist level: More than reasonable amount of time   Medical Problem List and Plan: 1.  Mobility and functional deficits secondary to polytrauma as above  -continue CIR therapies 2.  DVT Prophylaxis/Anticoagulation: Pharmaceutical: Lovenox  Dopplers neg for DVT on 9/3 3. Pain Management: Oxycodone prn effective.  4. Mood: LCSW to follow for evaluation and support.  5. Neuropsych: This patient is capable of making decisions on his own behalf. 6. Skin/Wound Care: Monitor incisions for healing.  7. Fluids/Electrolytes/Nutrition: Monitor I/O. Encourage po.   Changed to full liquid diet per pt request 8. L3 endplate fracture: Back precautions --don LSO when patient supine 9. ABLA:   Hb 8.0 on 9/2  Cont to monitor  10. Fevers: Appears to have resolved  Encourage IS  -UA negative, UCX negative 11.  Urinary retention:  Likely due to pelvic hematoma, immobility, constipation.   -continue flomax.  -Urecholine 10 TID started on 9/5  -foley d/ced 9/4 12. T2DM: Monitor BS ac/hs.   On metformin, tradjenta  Started Amaryl 1mg  on 9/4  Will consider further increase tomorrow  Use SSI for elevated BS.  13. HTN: Will monitor BP bid  Continue avapro, norvasc  and HCTZ.   Slightly labile, although relatively controlled  Monitor with increased activity 14. Constipation:  Improved  miralax bid, increased on 9/5  constipation on KUB  LOS (Days) 4 A FACE TO FACE EVALUATION WAS PERFORMED  Ankit Lorie Phenix 06/03/2016 8:36 AM

## 2016-06-03 NOTE — Progress Notes (Signed)
Initial Nutrition Assessment  DOCUMENTATION CODES:   Obesity unspecified  INTERVENTION:  Provide Glucerna Shake po BID, each supplement provides 220 kcal and 10 grams of protein.  Encourage adequate PO intake.   Diet handout regarding diabetes given.   NUTRITION DIAGNOSIS:   Increased nutrient needs related to acute illness as evidenced by estimated needs.  GOAL:   Patient will meet greater than or equal to 90% of their needs  MONITOR:   PO intake, Supplement acceptance, Labs, Weight trends, Skin, I & O's  REASON FOR ASSESSMENT:   Consult Diet education  ASSESSMENT:   65 y.o. white male with history of T2DM, HTN, melanoma who sustained a fall off 8 feet off a scaffolding (working on [employer]) on 05/24/2016 onto his back and had immediate onset of back and pelvic pain with inability to walk. 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  Pt is currently on a full liquid diet per pt request as pt reports disliking hospital meals. Pt reports eating well PTA with usual consumption of at least 3 meals a day with no other difficulties. Weight has been stable. RD to order Glucerna Shake to aid in caloric and protein needs as pt is only on a full liquid diet. Pt agreeable. RD additionally consulted for a diet education. During time of visit, pt was preoccupied on consuming his lunch tray, thus only briefly discussed on diabetic friendly drink options. RD to revisit to discuss additional diabetic diet education information.   Pt with no observed significant fat or muscle mass loss.   Labs and medications reviewed.   Diet Order:  Diet full liquid Room service appropriate? Yes; Fluid consistency: Thin  Skin:   (Incision on abdomen and R hip)  Last BM:  9/3  Height:   Ht Readings from Last 1 Encounters:   05/30/16 5\' 9"  (1.753 m)    Weight:   Wt Readings from Last 1 Encounters:  05/31/16 246 lb 6.4 oz (111.8 kg)    Ideal Body Weight:  72.7 kg  BMI:  Body mass index is 36.39 kg/m.  Estimated Nutritional Needs:   Kcal:  2000-2200  Protein:  105-115 grams  Fluid:  2- 2.2 L/day  EDUCATION NEEDS:   Education needs addressed  Corrin Parker, MS, RD, LDN Pager # [phone] After hours/ weekend pager # [phone]

## 2016-06-03 NOTE — Evaluation (Signed)
Recreational Therapy Assessment and Plan  Patient Details  Name: Corey Sellers MRN: [id number] Date of Birth: 10-22-1950 Today's Date: 06/03/2016  Rehab Potential: Good ELOS: 10 dyas  Assessment  Clinical Impression: Problem List:      Patient Active Problem List   Diagnosis Date Noted  . Trauma 05/30/2016  . Fall 05/28/2016  . Multiple pelvic fractures ([place]) 05/28/2016  . Acute blood loss anemia 05/28/2016  . L3 vertebral fracture ([place]) 05/28/2016  . Urinary retention 05/28/2016  . Pelvic fracture ([place])   . Surgery, elective   . Benign essential HTN   . Diabetes mellitus type 2 without retinopathy ([place])   . Post-operative pain   . SIRS (systemic inflammatory response syndrome) ([facility])   . Thrombocytopenia ([place])   . Pelvic hematoma in male 05/24/2016    Past Medical History:      Past Medical History:  Diagnosis Date  . Diabetes mellitus   . GERD (gastroesophageal reflux disease)   . Hypertension   . Melanoma of neck ([place])   . 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 Wears glasses    Past Surgical History:       Past Surgical History:  Procedure Laterality Date  . APPENDECTOMY    . BASAL CELL CARCINOMA EXCISION     left chin  . EXCISION METACARPAL MASS Right 05/23/2013   Procedure: RIGHT LONG EXCISION ANNULAR LIGAMENT CYST;  Surgeon: Tennis Must, MD;  Location: [facility];  Service: Orthopedics;  Laterality: Right;  . GANGLION CYST EXCISION Left 06/06/2013   Procedure: LEFT WRIST EXCISION MASS;  Surgeon: Tennis Must, MD;  Location: [facility];  Service: Orthopedics;  Laterality: Left;  . IR GENERIC HISTORICAL  05/24/2016   IR ANGIOGRAM PELVIS SELECTIVE OR SUPRASELECTIVE 05/24/2016 Arne Cleveland, MD [facility]  . IR GENERIC HISTORICAL  05/24/2016   IR US GUIDE VASC ACCESS RIGHT 05/24/2016 Arne Cleveland, MD [facility]  . IR GENERIC HISTORICAL  05/24/2016   IR EMBO ART  VEN HEMORR  LYMPH EXTRAV  INC GUIDE ROADMAPPING 05/24/2016 Arne Cleveland, MD [facility]  . JOINT REPLACEMENT  2003,2004   Rt shoulder  . ORIF PELVIC FRACTURE N/A 05/27/2016   Procedure: OPEN REDUCTION INTERNAL FIXATION (ORIF) SYMPHYSIS PUBIS W/ TRANSSACRAL SCREWS;  Surgeon: Altamese Vernon Center, MD;  Location: [facility];  Service: Orthopedics;  Laterality: N/A;  . PAROTIDECTOMY W/ NECK DISSECTION TOTAL  2013   left parotidectomy-26 nodes removed  . TONSILLECTOMY    . TRIGGER FINGER RELEASE Right 05/23/2013   Procedure: RELEASE TRIGGER FINGER/A-1 PULLEY RIGHT INDEX AND LONG;  Surgeon: Tennis Must, MD;  Location: [facility];  Service: Orthopedics;  Laterality: Right;  . TRIGGER FINGER RELEASE Left 06/06/2013   Procedure: RELEASE TRIGGER FINGER/A-1 PULLEY LEFT SMALL;  Surgeon: Tennis Must, MD;  Location: [facility];  Service: Orthopedics;  Laterality: Left;  Marland Kitchen VARICOCELE EXCISION     left    Assessment & Plan Clinical Impression: Patient is a 65 y.o.white male with history of T2DM, HTN, melanomawho sustained a fall off 8 feet off ascaffolding (working on [employer]) on 05/24/2016 onto his back and had immediate onset of back and pelvic pain with inability to walk. He was noted to be hypotensive and was found to have pelvic symphysis diastasis with large pelvic hematoma with active extravasation, right sacral wing fracture, superior endplate fracture deformity L3 vertebra. He was taken to IR emergently for iliac artery embolization. Dr.  Jones recommended LSO for L3 fracture--to be donned when supine and follow up repeat films in 2 weeks to monitor for stability. Dr. Marcelino Scot consulted for input due to complexity of pelvic fracture and patient underwent ORIF anterior pubic symphysis with transsacral screw fixation right and left. NWB RLE for 8 weeks and suture removal in 2 weeks.  He has had significant scrotal edema with difficulty voiding--urecholine and flomax added to  help with symptoms. Has had abdominal distension with constipation, fevers as well as ABLA.  Patient transferred to CIR on 05/30/2016.   Pt presents with decreased activity tolerance, decreased functional mobility, decreased balance, difficulty maintaining precautions limiting pt's independence with leisure/community pursuits.  Leisure History/Participation Premorbid leisure interest/current participation: Medical laboratory scientific officer - Building control surveyor - Doctor, hospital - Travel (Comment) (geneology, showing his dogs at shows) Other Leisure Interests: Television;Reading Leisure Participation Style: Alone Futures trader Resources: Fair-identify 2 post discharge leisure resources Psychosocial / Spiritual Patient agreeable to Pet Therapy: Yes Does patient have pets?: Yes (rotties) Social interaction - Mood/Behavior: Cooperative Academic librarian Appropriate for Education?: Yes Recreational Therapy Orientation Orientation -Reviewed with patient: Available activity resources Strengths/Weaknesses Patient Strengths/Abilities: Active premorbidly Patient weaknesses: Physical limitations TR Patient demonstrates impairments in the following area(s): Edema;Endurance;Motor;Pain;Safety;Skin Integrity   Met with pt to discuss use of leisure time post discharge and community pursuits.  Pt stated that he was prepared and ready to go home when team feels he is ready, that he had things to do to stay occupied. Pt with little interest in TR services at this time.  Will continue to monitor through team.  The above assessment, treatment plan, treatment alternatives and goals were discussed and mutually agreed upon: by patient  [place] 06/03/2016, 4:05 PM

## 2016-06-03 NOTE — Progress Notes (Signed)
Occupational Therapy Session Note  Patient Details  Name: Corey Sellers MRN: [id number] Date of Birth: Jun 08, 1951  Today's Date: 06/03/2016 OT Individual Time: [id number]  OT Individual Time Calculation (min): 55 min     Short Term Goals: Week 1:  OT Short Term Goal 1 (Week 1): Complete toileting using AE with min assist OT Short Term Goal 2 (Week 1): Bathe sitting using lateral leans to wash buttocks and AE to extend reach with mod assist OT Short Term Goal 3 (Week 1): Don TLSO with min assist while supine OT Short Term Goal 4 (Week 1): Dress lower body using AE, prn, with mod assist OT Short Term Goal 5 (Week 1): Transfer to [facility] with steadying assist  Skilled Therapeutic Interventions/Progress Updates:    Upon entering the room, pt supine in bed and with 5/10 pain in R LE. Pt upset as his breakfast order was not correct and declining to do therapy until he ate. OT provided pt with set up for juice and vanilla yogurt while providing education regarding OT purpose, upcoming conference, and progress towards goals. Pt rolled L <> R with min A and use of bed rails for OT to assist in donning TLSO in supine. Pt performed supine >sit with mod A to EOB. Stand pivot transfer with RW into wheelchair. OT propelled pt into bathroom as pt is requesting to stand to attempt to have successful urination. Pt standing with mod A and use of grab bar. OT providing min A for balance and pt maintaining NWB on R LE. Pt returning to wheelchair and then requesting to return to bed for bladder scan. OT unable to redirect pt and therefore OT assisted pt back to bed in same manner and notified RN. Call bell and all needed items within reach upon exiting the room.     Therapy Documentation Precautions:  Precautions Precautions: Back, Fall Precaution Booklet Issued: No Precaution Comments: Patient unable to recall back precautions.  Required Braces or Orthoses: Spinal Brace Spinal Brace: Thoracolumbosacral  orthotic, Applied in supine position Restrictions Weight Bearing Restrictions: Yes RLE Weight Bearing: Non weight bearing LLE Weight Bearing: Weight bearing as tolerated (for transfers only ) Other Position/Activity Restrictions: Pt could maintain NWB on RLE with 2 person assist Vital Signs: Therapy Vitals BP: (!) 150/78   ADL: ADL ADL Comments: see Functional Assessment Exercises:   Other Treatments:    See Function Navigator for Current Functional Status.   Therapy/Group: Individual Therapy  Phineas Semen 06/03/2016, 12:33 PM

## 2016-06-03 NOTE — Progress Notes (Signed)
Physical Therapy Session Note  Patient Details  Name: Corey Sellers MRN: [id number] Date of Birth: 09-12-1951  Today's Date: 06/03/2016 PT Individual Time: 1100-1157 PT Individual Time Calculation (min): 57 min    Short Term Goals: Week 1:  PT Short Term Goal 1 (Week 1): Patient will perform bed mobility with supervision A and flat bed.  PT Short Term Goal 2 (Week 1): Patient will transfer to [facility] with stand pivot/squat pivot with supervision A.  PT Short Term Goal 3 (Week 1): Patient will initiate gait training with LRAD PT Short Term Goal 4 (Week 1): Patient will initiate stair training for access into house.   Skilled Therapeutic Interventions/Progress Updates:    Pt received resting in bed, no reports of pain, and agreeable to therapy session.  Pt donned shirt with set up assist to obtain shirt from dresser.  PT donned TLSO total assist with pt rolling L and R with supervision using bed rails.  Supine>sit via log roll with min cues for back precautions and more than a reasonable amount of time.  Pt used reacher to thread pants over bilateral LEs but unable to pull up 2/2 scrotal swelling and size of pants.  Pt doffed pants and donned a pair of shorts using reacher and sit<>Stand to pull up.  Hop transfer to w/c using RW and pt able to maintain NWB throughout transfer with min cues.  W/C propulsion throughout unit, max distance 100' without break with supervision.  Pt able to make cup of coffee from w/c level with PT providing cues for use of unfamiliar device.  Discussed home entry with patient and initiated problem solving for home entry and exit if a ramp is not a possibility.  Pt returned to room at end of session and transferred back to bed with steady assist, maintaining NWB 100% of the time.  Sit>supine via side lying with supervision and PT doffed TLSO.  Pt positioned to comfort and left with call bell in reach and needs met.   Therapy Documentation Precautions:   Precautions Precautions: Back, Fall Precaution Booklet Issued: No Precaution Comments: Patient unable to recall back precautions.  Required Braces or Orthoses: Spinal Brace Spinal Brace: Thoracolumbosacral orthotic, Applied in supine position Restrictions Weight Bearing Restrictions: Yes RLE Weight Bearing: Non weight bearing LLE Weight Bearing: Weight bearing as tolerated (for transfers only ) Other Position/Activity Restrictions: Pt could maintain NWB on RLE with 2 person assist   See Function Navigator for Current Functional Status.   Therapy/Group: Individual Therapy  Earnest Conroy Penven-Crew 06/03/2016, 12:58 PM

## 2016-06-04 ENCOUNTER — Inpatient Hospital Stay (HOSPITAL_COMMUNITY): Payer: BLUE CROSS/BLUE SHIELD | Admitting: Physical Therapy

## 2016-06-04 ENCOUNTER — Inpatient Hospital Stay (HOSPITAL_COMMUNITY): Payer: Self-pay | Admitting: Occupational Therapy

## 2016-06-04 LAB — GLUCOSE, CAPILLARY
GLUCOSE-CAPILLARY: 123 mg/dL — AB (ref 65–99)
GLUCOSE-CAPILLARY: 153 mg/dL — AB (ref 65–99)
GLUCOSE-CAPILLARY: 148 mg/dL — AB (ref 65–99)
Glucose-Capillary: 155 mg/dL — ABNORMAL HIGH (ref 65–99)

## 2016-06-04 MED ORDER — BETHANECHOL CHLORIDE 25 MG PO TABS
25.0000 mg | ORAL_TABLET | Freq: Three times a day (TID) | ORAL | Status: DC
  Administered 2016-06-04 – 2016-06-05 (×3): 25 mg via ORAL
  Filled 2016-06-04 (×3): qty 1

## 2016-06-04 MED ORDER — GLIMEPIRIDE 2 MG PO TABS
2.0000 mg | ORAL_TABLET | Freq: Every day | ORAL | Status: DC
  Administered 2016-06-05 – 2016-06-07 (×3): 2 mg via ORAL
  Filled 2016-06-04 (×3): qty 1

## 2016-06-04 NOTE — Progress Notes (Signed)
[facility] PHYSICAL MEDICINE & REHABILITATION     PROGRESS NOTE    Subjective/Complaints: Pt laying in bed.  He states he is feeling better.  He notes decrease in scrotal edema and passing gas more.  He notes change in sensation on right side of face.    ROS: +right facial dysesthesia. Denies CP, SOB, N/V/D.   Objective: Vital Signs: Blood pressure (!) 125/53, pulse 93, temperature 98.7 F (37.1 C), temperature source Oral, resp. rate 17, height 5\' 9"  (1.753 m), weight 111.8 kg (246 lb 6.4 oz), SpO2 93 %. No results found. No results for input(s): WBC, HGB, HCT, PLT in the last 72 hours. No results for input(s): NA, K, CL, GLUCOSE, BUN, CREATININE, CALCIUM in the last 72 hours.  Invalid input(s): CO CBG (last 3)   Recent Labs  06/03/16 1625 06/03/16 2149 06/04/16 0628  GLUCAP 155* 149* 123*    Wt Readings from Last 3 Encounters:  05/31/16 111.8 kg (246 lb 6.4 oz)  05/24/16 112 kg (247 lb)  12/28/13 106.1 kg (234 lb)    Physical Exam:  Constitutional: He appears well-developed and well-nourished.  NAD.  HENT: Normocephalic and atraumatic.  Eyes: Conjunctivae and EOM are normal.  Cardiovascular: Normal rate and regular rhythm.  Exam reveals no gallop and no friction rub.  No murmur heard. Respiratory: Effort normal and breath sounds normal. No stridor. No respiratory distress. He has no wheezes.  GI: +Distention (improving). Normal appearance and bowel sounds are present. There is no tenderness.  Musculoskeletal: He exhibits scrotal edema (improving). He exhibits no tenderness.  Neurological: He is alert and oriented.  CN II-XII intact on right side Speech clear.  Able to follow basic commands without difficulty.  UE strength grossly 5/5.  RLE: 4-/5HF , 3/5 KE and 4+/5 ADF/PF.  LLE: 4-/5HF , 3/5 KE and 4+/5 ADF/PF.  No sensory changes.  Skin: Skin is warm and dry.  Hypogastric incision intact with sutures, c/d/i. Right hip incision with suture c/d/i. Psychiatric:  He has a flat. mood and affect. His behavior is normal. Thought content normal.    Assessment/Plan: 1. Mobility and functional deficits secondary to polytrauma which require 3+ hours per day of interdisciplinary therapy in a comprehensive inpatient rehab setting. Physiatrist is providing close team supervision and 24 hour management of active medical problems listed below. Physiatrist and rehab team continue to assess barriers to discharge/monitor patient progress toward functional and medical goals.  Function:  Bathing Bathing position   Position: Other (comment) (EOB for LB ADLs and bedlevel for UB ADLs)  Bathing parts Body parts bathed by patient: Right arm, Left arm, Chest, Abdomen, Front perineal area, Right upper leg, Left upper leg, Right lower leg, Left lower leg, Back Body parts bathed by helper: Buttocks  Bathing assist Assist Level: Touching or steadying assistance(Pt > 75%)      Upper Body Dressing/Undressing Upper body dressing   What is the patient wearing?: Pull over shirt/dress, Orthosis     Pull over shirt/dress - Perfomed by patient: Thread/unthread right sleeve, Thread/unthread left sleeve, Put head through opening, Pull shirt over trunk       Orthosis activity level: Performed by helper  Upper body assist Assist Level: More than reasonable time      Lower Body Dressing/Undressing Lower body dressing   What is the patient wearing?: Pants     Pants- Performed by patient: Thread/unthread right pants leg, Thread/unthread left pants leg Pants- Performed by helper: Pull pants up/down Non-skid slipper socks- Performed by patient:  Don/doff right sock, Don/doff left sock Non-skid slipper socks- Performed by helper: Don/doff right sock, Don/doff left sock                  Lower body assist Assist for lower body dressing: Touching or steadying assistance (Pt > 75%)      Toileting Toileting Toileting activity did not occur: Refused   Toileting steps  completed by helper: Adjust clothing prior to toileting, Performs perineal hygiene, Adjust clothing after toileting (per Martin Majestic, NT)    Toileting assist     Transfers Chair/bed transfer   Chair/bed transfer method: Squat pivot Chair/bed transfer assist level: Touching or steadying assistance (Pt > 75%) Chair/bed transfer assistive device: Armrests     Locomotion Ambulation Ambulation activity did not occur: Safety/medical concerns         Wheelchair   Type: Manual Max wheelchair distance: 300+ Assist Level: Supervision or verbal cues  Cognition Comprehension Comprehension assist level: Follows complex conversation/direction with extra time/assistive device  Expression Expression assist level: Expresses complex ideas: With extra time/assistive device  Social Interaction Social Interaction assist level: Interacts appropriately with others - No medications needed.  Problem Solving Problem solving assist level: Solves basic problems with no assist  Memory Memory assist level: More than reasonable amount of time   Medical Problem List and Plan: 1.  Mobility and functional deficits secondary to polytrauma as above  -continue CIR therapies 2.  DVT Prophylaxis/Anticoagulation: Pharmaceutical: Lovenox  Dopplers neg for DVT on 9/3 3. Pain Management: Oxycodone prn effective.  4. Mood: LCSW to follow for evaluation and support.  5. Neuropsych: This patient is capable of making decisions on his own behalf. 6. Skin/Wound Care: Monitor incisions for healing.  7. Fluids/Electrolytes/Nutrition: Monitor I/O. Encourage po.   Changed to full liquid diet per pt request 8. L3 endplate fracture: Back precautions --don LSO when patient supine 9. ABLA:   Hb 8.0 on 9/2  Cont to monitor  10. Fevers: Appears to have resolved  Encourage IS  -UA negative, UCX negative 11.  Urinary retention:  Likely due to pelvic hematoma, immobility, constipation.   -continue flomax.  -Urecholine 10 TID  started on 9/5, increased to 25 on 9/6  -foley d/ced 9/4 12. T2DM: Monitor BS ac/hs.   On metformin, tradjenta  Started Amaryl 1mg  on 9/4, increased 2mg  on 9/6  Will consider further increase tomorrow  Use SSI for elevated BS.  13. HTN: Will monitor BP bid  Continue avapro, norvasc and HCTZ.   Slightly labile, although relatively controlled  Monitor with increased activity 14. Constipation:  Improved  miralax bid, increased on 9/5  constipation on KUB  LOS (Days) 5 A FACE TO FACE EVALUATION WAS PERFORMED  Ankit Lorie Phenix 06/04/2016 8:06 AM

## 2016-06-04 NOTE — Progress Notes (Signed)
Occupational Therapy Session Note  Patient Details  Name: Corey Sellers MRN: [id number] Date of Birth: Oct 09, 1950  Today's Date: 06/04/2016   OT Individual Time: [id number] OT Individual Time Calculation (min): 57 min    Short Term Goals: Week 1:  OT Short Term Goal 1 (Week 1): Complete toileting using AE with min assist OT Short Term Goal 2 (Week 1): Bathe sitting using lateral leans to wash buttocks and AE to extend reach with mod assist OT Short Term Goal 3 (Week 1): Don TLSO with min assist while supine OT Short Term Goal 4 (Week 1): Dress lower body using AE, prn, with mod assist OT Short Term Goal 5 (Week 1): Transfer to [facility] with steadying assist  Skilled Therapeutic Interventions/Progress Updates:   Pt seen for skilled OT treatment with focus on adapted bathing/dressing with back precautions and functional transfers. Pt rolled with supervision using bed rail to don brace in supine. OT reviewed precautions, pt donned socks using sock aid with min instructional cues,  then pt completed stand-pivot to w/c with RW and Min A. Pt performed w/c level bathing at the sink. Pt able to stand briefly while maintaining WB status with RW to wash buttocks with OT assist. Pt transferred back to bed with Min A and RW and donned shorts and shirt using rolling technique and assist to thread pants without AE. Pt left supine in bed upon OT departure.    Therapy Documentation Precautions:  Precautions Precautions: Back, Fall Precaution Booklet Issued: No Required Braces or Orthoses: Spinal Brace Spinal Brace: Thoracolumbosacral orthotic, Applied in supine position Restrictions Weight Bearing Restrictions: Yes RLE Weight Bearing: Non weight bearing LLE Weight Bearing: Weight bearing as tolerated General: Pain: Pain Assessment Pain Assessment: No/denies pain Pain Score: 4  Pain Type: Acute pain Pain Location: Back Pain Orientation: Lower Pain Descriptors / Indicators: Aching Pain Onset: With  Activity Pain Intervention(s): Repositioned ADL: ADL ADL Comments: see Functional Assessment  See Function Navigator for Current Functional Status.   Therapy/Group: Individual Therapy  Valma Cava 06/04/2016, 4:49 PM

## 2016-06-04 NOTE — Progress Notes (Signed)
Physical Therapy Session Note  Patient Details  Name: Corey Sellers MRN: [id number] Date of Birth: 1950/11/03  Today's Date: 06/04/2016 PT Individual Time: 1100-1157 PT Individual Time Calculation (min): 57 min    Short Term Goals:Week 1:  PT Short Term Goal 1 (Week 1): Patient will perform bed mobility with supervision A and flat bed.  PT Short Term Goal 2 (Week 1): Patient will transfer to [facility] with stand pivot/squat pivot with supervision A.  PT Short Term Goal 3 (Week 1): Patient will initiate gait training with LRAD PT Short Term Goal 4 (Week 1): Patient will initiate stair training for access into house.   Skilled Therapeutic Interventions/Progress Updates:  Pt received resting in bed, easily awakes to voice, no c/o pain but moderately agitated about schedule changes today.  PT provided emotional support and redirected pt who ultimately agreed to therapy session.  PT donned TLSO in supine and pt transitioned supine>sit with supervision using bed rail.  Stand/pivot using RW with steady assist, min multimodal cues for hand placement. Pt refusing car transfer initially stating "that is not good practice because it is nothing like my wife's car, there are no handles to hold onto."  Pt propelled w/c to therapy gym with overall supervision and more than a reasonable amount of time.  PT instructed pt in LE therex 2x15 reps LAQ and hip flexion.  PT instructed pt in w/c obstacle course focus on basic simulation of home environment x3 trials with pt initially requiring min verbal cues progress to supervision only.  Pt propelled w/c back to room at end of session and agreeable to sit upright with call bell in reach and needs met.      Therapy Documentation Precautions:  Precautions Precautions: Back, Fall Precaution Booklet Issued: No Precaution Comments: Patient unable to recall back precautions.  Required Braces or Orthoses: Spinal Brace Spinal Brace: Thoracolumbosacral orthotic, Applied in  supine position Restrictions Weight Bearing Restrictions: Yes RLE Weight Bearing: Non weight bearing LLE Weight Bearing: Weight bearing as tolerated Other Position/Activity Restrictions: Pt could maintain NWB on RLE with 2 person assist   See Function Navigator for Current Functional Status.   Therapy/Group: Individual Therapy  Silvana Holecek E Penven-Crew 06/04/2016, 11:28 AM

## 2016-06-04 NOTE — Progress Notes (Signed)
Physical Therapy Session Note  Patient Details  Name: Corey Sellers MRN: [id number] Date of Birth: Mar 26, 1951  Today's Date: 06/04/2016 PT Individual Time: 1001-1025 PT Individual Time Calculation (min): 24 min    Short Term Goals: Week 1:  PT Short Term Goal 1 (Week 1): Patient will perform bed mobility with supervision A and flat bed.  PT Short Term Goal 2 (Week 1): Patient will transfer to [facility] with stand pivot/squat pivot with supervision A.  PT Short Term Goal 3 (Week 1): Patient will initiate gait training with LRAD PT Short Term Goal 4 (Week 1): Patient will initiate stair training for access into house.   Skilled Therapeutic Interventions/Progress Updates:    Pt received in bed & not agreeable to therapy as he was not expecting change in schedule. Provided pt with information regarding new therapy schedule for this date & encouraged pt to participate. Pt willing to perform BLE exercises in bed to tolerate; pt performed supine hip abduction, short arc quads with 5 second hold, and straight leg raises LLE with assistance from therapist. Pt unable to perform various exercises as he is limited by scrotal edema. Educated pt on new schedule on this date and pt became very agitated, refusing to participate in further treatment. Pt left in bed with all needs within reach.   Therapy Documentation Precautions:  Precautions Precautions: Back, Fall Precaution Booklet Issued: No Precaution Comments: Patient unable to recall back precautions.  Required Braces or Orthoses: Spinal Brace Spinal Brace: Thoracolumbosacral orthotic, Applied in supine position Restrictions Weight Bearing Restrictions: Yes RLE Weight Bearing: Non weight bearing LLE Weight Bearing: Weight bearing as tolerated Other Position/Activity Restrictions: Pt could maintain NWB on RLE with 2 person assist   See Function Navigator for Current Functional Status.   Therapy/Group: Individual Therapy  Waunita Schooner 06/04/2016, 10:33 AM

## 2016-06-04 NOTE — Patient Care Conference (Addendum)
Inpatient RehabilitationTeam Conference and Plan of Care Update Date: 06/05/2016   Time: 2:35 PM    Patient Name: Corey Sellers      Medical Record Number: [id number]  Date of Birth: Jul 23, 1951 Sex: Male         Room/Bed: 4W18C/4W18C-01 Payor Info: Payor: BLUE CROSS BLUE SHIELD / Plan: BCBS OTHER / Product Type: *No Product type* /    Admitting Diagnosis: polytrauma  Admit Date/Time:  05/30/2016  3:46 PM Admission Comments: No comment available   Primary Diagnosis:  Trauma Principal Problem: Trauma  Patient Active Problem List   Diagnosis Date Noted  . Hyponatremia   . Ileus ([place])   . Constipation   . Controlled type 2 diabetes mellitus with hyperglycemia, without long-term current use of insulin ([place])   . Closed fracture of lumbar vertebra ([place])   . Trauma 05/30/2016  . Fall 05/28/2016  . Multiple pelvic fractures ([place]) 05/28/2016  . Acute blood loss anemia 05/28/2016  . L3 vertebral fracture ([place]) 05/28/2016  . Urinary retention 05/28/2016  . Pelvic fracture ([place])   . Surgery, elective   . Benign essential HTN   . Diabetes mellitus type 2 without retinopathy ([place])   . Post-operative pain   . SIRS (systemic inflammatory response syndrome) ([facility])   . Thrombocytopenia ([place])   . Pelvic hematoma in male 05/24/2016    Expected Discharge Date: Expected Discharge Date: 06/13/16  Team Members Present: Physician leading conference: Dr. Delice Lesch Social Worker Present: Lennart Pall, LCSW Nurse Present: Heather Roberts, RN PT Present: Dwyane Dee, PT;Rodney Lajuana Matte, PT OT Present: [name], OT PPS Coordinator present : Daiva Nakayama, RN, CRRN     Current Status/Progress Goal Weekly Team Focus  Medical   Mobility and functional deficits secondary to polytrauma  Improve endurance, safety, urinary retention  See above   Bowel/Bladder   continet of bowel requiring suppository for bowel movement, requires q 4 i/o caths, ureocoline added 06-03-16  Continent of bowel and  bladder  Assess bladder q 4 hours, Monitor q shift bowel and assess need for prn bowel med   Swallow/Nutrition/ Hydration          ADL's   max LB dressing, min A UB supine (due to bracing being doffed), minA transfer training, max A toileting   bathing with min  , dressing with supervision, toileting with mod A homemaking at w/c level with supervision   transfer training, activity tolerance, standing balance, task organization and sequencing. LB dressing/ bathing and toileting   Mobility   min assist transfers with RW  supervision/mod I   transfers, sitting balance, activity tolerance    Communication             Safety/Cognition/ Behavioral Observations            Pain   back pain . ultram 50 mg po and flexeril 10mg  po every 6 hours during the day   less than 4  monitor effectiveness of pain meds   Skin   abdomen and hip incision with mirplex in place and sutures, scrotum red and swollen microguard and elavation   No new skin infection/breakdown  Monitor skin q shift and prn    Rehab Goals Patient on target to meet rehab goals: Yes *See Care Plan and progress notes for long and short-term goals.  Barriers to Discharge: Safety, mobility, ABLA, urinary retention, HTN, DM, poor endurance    Possible Resolutions to Barriers:  therapies, follow labs, optimize bladder meds, optimize DM and HTN meds  Discharge Planning/Teaching Needs:  Plans to return home with his wife who does work f/t.        Team Discussion:  Minor medical issues but concern over urinary retention and requiring I/o caths and whether he could truly be able to self-cath if needed at home.  Pt very grumpy with staff and poor endurance overall.  Currently min assist with tfs using rolling walker and short hops.  Not very realistic about the steps he needs to follow to reach functional goals.  He continues to stay very focused on depending on his wife for assistance, however, she does work f/t.  SW to follow up with  her.  Discussion between therapies about realistic transfer goals given the TLSO restrictions and they will continue to clarify with SW to follow up with pt/wife.  Revisions to Treatment Plan:  May need to revise some goals - TBD   Continued Need for Acute Rehabilitation Level of Care: The patient requires daily medical management by a physician with specialized training in physical medicine and rehabilitation for the following conditions: Daily direction of a multidisciplinary physical rehabilitation program to ensure safe treatment while eliciting the highest outcome that is of practical value to the patient.: Yes Daily medical management of patient stability for increased activity during participation in an intensive rehabilitation regime.: Yes Daily analysis of laboratory values and/or radiology reports with any subsequent need for medication adjustment of medical intervention for : Post surgical problems;Diabetes problems;Blood pressure problems;Mood/behavior problems;Urological problems  Ardelle Haliburton 06/05/2016, 1:17 PM

## 2016-06-04 NOTE — Progress Notes (Signed)
Occupational Therapy Session Note  Patient Details  Name: Corey Sellers MRN: [id number] Date of Birth: 1951/04/09  Today's Date: 06/04/2016 OT Individual Time: 1300-1400 OT Individual Time Calculation (min): 60 min     Short Term Goals: Week 1:  OT Short Term Goal 1 (Week 1): Complete toileting using AE with min assist OT Short Term Goal 2 (Week 1): Bathe sitting using lateral leans to wash buttocks and AE to extend reach with mod assist OT Short Term Goal 3 (Week 1): Don TLSO with min assist while supine OT Short Term Goal 4 (Week 1): Dress lower body using AE, prn, with mod assist OT Short Term Goal 5 (Week 1): Transfer to [facility] with steadying assist  Skilled Therapeutic Interventions/Progress Updates:   1:1 Therapeutic activity: Pt reporting "washing up earlier at the sink- declined showering.   Pt self propelled down to the gym with focus on UB and core strengthening and conditioning. Performed squat pivot transfer w/c to mat with max A with difficulty with bottom clearance. Once on mat addressed UB strengthening with seated push up with blocks and then progressed to mini squats down the mat with blocks while maintaining weight bearing status. Pt transferred with RW back into w/c with min A with VC for hand placement. Pt then transferred back into bed with min A with RW and min A to return to supine and total A for removal of TLSO.   Goals downgraded according- Anticipate pt will require A with toileting hygiene, setup for transfers with RW and removal of LE rests, and setup for A with donning/ doffing TLSO.  At this time pt is still requiring I&O cathing- pt with interest of cathing self- RN to follow up with pt.   Therapy Documentation Precautions:  Precautions Precautions: Back, Fall Precaution Booklet Issued: No Precaution Comments: Patient unable to recall back precautions.  Required Braces or Orthoses: Spinal Brace Spinal Brace: Thoracolumbosacral orthotic, Applied in supine  position Restrictions Weight Bearing Restrictions: Yes RLE Weight Bearing: Non weight bearing LLE Weight Bearing: Weight bearing as tolerated Other Position/Activity Restrictions: Pt could maintain NWB on RLE with 2 person assist Pain:  ongoing discomfort in lower abdominal and lower back- RN aware- got back in bed at end of session ADL: ADL ADL Comments: see Functional Assessment Exercises:   Other Treatments:    See Function Navigator for Current Functional Status.   Therapy/Group: Individual Therapy  [name] [facility] 06/04/2016, 3:09 PM

## 2016-06-05 ENCOUNTER — Inpatient Hospital Stay (HOSPITAL_COMMUNITY): Payer: BLUE CROSS/BLUE SHIELD | Admitting: Occupational Therapy

## 2016-06-05 ENCOUNTER — Inpatient Hospital Stay (HOSPITAL_COMMUNITY): Payer: BLUE CROSS/BLUE SHIELD | Admitting: Physical Therapy

## 2016-06-05 DIAGNOSIS — E871 Hypo-osmolality and hyponatremia: Secondary | ICD-10-CM

## 2016-06-05 LAB — BASIC METABOLIC PANEL
Anion gap: 9 (ref 5–15)
BUN: 19 mg/dL (ref 6–20)
CALCIUM: 9.1 mg/dL (ref 8.9–10.3)
CHLORIDE: 98 mmol/L — AB (ref 101–111)
CO2: 26 mmol/L (ref 22–32)
CREATININE: 0.98 mg/dL (ref 0.61–1.24)
GFR calc non Af Amer: >60 mL/min (ref >60)
Glucose, Bld: 119 mg/dL — ABNORMAL HIGH (ref 65–99)
Potassium: 4.3 mmol/L (ref 3.5–5.1)
SODIUM: 133 mmol/L — AB (ref 135–145)

## 2016-06-05 LAB — GLUCOSE, CAPILLARY
GLUCOSE-CAPILLARY: 112 mg/dL — AB (ref 65–99)
GLUCOSE-CAPILLARY: 142 mg/dL — AB (ref 65–99)
GLUCOSE-CAPILLARY: 147 mg/dL — AB (ref 65–99)

## 2016-06-05 LAB — CBC WITH DIFFERENTIAL/PLATELET
BASOS PCT: 0 %
Basophils Absolute: 0 10*3/uL (ref 0.0–0.1)
EOS ABS: 0.4 10*3/uL (ref 0.0–0.7)
EOS PCT: 6 %
HCT: 27 % — ABNORMAL LOW (ref 39.0–52.0)
Hemoglobin: 8.4 g/dL — ABNORMAL LOW (ref 13.0–17.0)
LYMPHS ABS: 1.1 10*3/uL (ref 0.7–4.0)
Lymphocytes Relative: 15 %
MCH: 28.2 pg (ref 26.0–34.0)
MCHC: 31.1 g/dL (ref 30.0–36.0)
MCV: 90.6 fL (ref 78.0–100.0)
MONOS PCT: 8 %
Monocytes Absolute: 0.6 10*3/uL (ref 0.1–1.0)
Neutro Abs: 5.3 10*3/uL (ref 1.7–7.7)
Neutrophils Relative %: 71 %
PLATELETS: 227 10*3/uL (ref 150–400)
RBC: 2.98 MIL/uL — ABNORMAL LOW (ref 4.22–5.81)
RDW: 14.3 % (ref 11.5–15.5)
WBC: 7.5 10*3/uL (ref 4.0–10.5)

## 2016-06-05 MED ORDER — SIMETHICONE 80 MG PO CHEW
80.0000 mg | CHEWABLE_TABLET | Freq: Four times a day (QID) | ORAL | Status: DC
  Administered 2016-06-05 – 2016-06-13 (×31): 80 mg via ORAL
  Filled 2016-06-05 (×33): qty 1

## 2016-06-05 MED ORDER — BETHANECHOL CHLORIDE 25 MG PO TABS
50.0000 mg | ORAL_TABLET | Freq: Three times a day (TID) | ORAL | Status: DC
  Administered 2016-06-05 – 2016-06-13 (×24): 50 mg via ORAL
  Filled 2016-06-05 (×24): qty 2

## 2016-06-05 NOTE — Progress Notes (Signed)
[place] PHYSICAL MEDICINE & REHABILITATION     PROGRESS NOTE    Subjective/Complaints: Pt laying in bed this AM.  He complains of incisional discomfort.    ROS: +abd incisional discomfort. Denies CP, SOB, N/V/D.   Objective: Vital Signs: Blood pressure (!) 130/57, pulse 89, temperature 98.6 F (37 C), temperature source Oral, resp. rate 18, height 5\' 9"  (1.753 m), weight 109.3 kg (240 lb 15.4 oz), SpO2 95 %. No results found.  Recent Labs  06/05/16 0508  WBC 7.5  HGB 8.4*  HCT 27.0*  PLT 227    Recent Labs  06/05/16 0508  NA 133*  K 4.3  CL 98*  GLUCOSE 119*  BUN 19  CREATININE 0.98  CALCIUM 9.1   CBG (last 3)   Recent Labs  06/04/16 1633 06/04/16 2122 06/05/16 0646  GLUCAP 155* 148* 112*    Wt Readings from Last 3 Encounters:  06/03/16 109.3 kg (240 lb 15.4 oz)  05/24/16 112 kg (247 lb)  12/28/13 106.1 kg (234 lb)    Physical Exam:  Constitutional: He appears well-developed and well-nourished.  NAD.  HENT: Normocephalic and atraumatic.  Eyes: Conjunctivae and EOM are normal.  Cardiovascular: Normal rate and regular rhythm.  Exam reveals no gallop and no friction rub.  No murmur heard. Respiratory: Effort normal and breath sounds normal. No stridor. No respiratory distress. He has no wheezes.  GI: +Distention (improving). Normal appearance and bowel sounds are present. There is no tenderness.  Musculoskeletal: He exhibits scrotal edema (slowly improving). He exhibits no tenderness.  Neurological: He is alert and oriented.  Speech clear.  Able to follow basic commands without difficulty.  UE strength grossly 5/5.  RLE: 4-/5HF , 3/5 KE and 4+/5 ADF/PF.  LLE: 4-/5HF , 3/5 KE and 4+/5 ADF/PF.  No sensory changes.  Skin: Skin is warm and dry.  Hypogastric incision intact with sutures, c/d/i. Right hip incision with suture c/d/i. Psychiatric: He has a flat. mood and affect. His behavior is normal. Thought content normal.    Assessment/Plan: 1.  Mobility and functional deficits secondary to polytrauma which require 3+ hours per day of interdisciplinary therapy in a comprehensive inpatient rehab setting. Physiatrist is providing close team supervision and 24 hour management of active medical problems listed below. Physiatrist and rehab team continue to assess barriers to discharge/monitor patient progress toward functional and medical goals.  Function:  Bathing Bathing position   Position: Wheelchair/chair at sink  Bathing parts Body parts bathed by patient: Right arm, Left arm, Chest, Abdomen, Front perineal area, Right upper leg, Left upper leg Body parts bathed by helper: Back, Buttocks  Bathing assist Assist Level: Touching or steadying assistance(Pt > 75%)      Upper Body Dressing/Undressing Upper body dressing   What is the patient wearing?: Pull over shirt/dress, Orthosis     Pull over shirt/dress - Perfomed by patient: Put head through opening, Thread/unthread left sleeve, Thread/unthread right sleeve Pull over shirt/dress - Perfomed by helper: Pull shirt over trunk     Orthosis activity level: Performed by helper  Upper body assist Assist Level: Touching or steadying assistance(Pt > 75%)      Lower Body Dressing/Undressing Lower body dressing   What is the patient wearing?: Pants, Non-skid slipper socks     Pants- Performed by patient: Thread/unthread right pants leg, Thread/unthread left pants leg Pants- Performed by helper: Thread/unthread right pants leg, Thread/unthread left pants leg, Pull pants up/down (without AE) Non-skid slipper socks- Performed by patient: Don/doff right sock, Don/doff  left sock Non-skid slipper socks- Performed by helper: Don/doff right sock, Don/doff left sock                  Lower body assist Assist for lower body dressing: Assistive device, Touching or steadying assistance (Pt > 75%) Assistive Device Comment: sock aid    Toileting Toileting Toileting activity did not  occur: Refused   Toileting steps completed by helper: Adjust clothing prior to toileting, Performs perineal hygiene, Adjust clothing after toileting (per Martin Majestic, NT)    Toileting assist     Transfers Chair/bed transfer   Chair/bed transfer method: Stand pivot Chair/bed transfer assist level: Supervision or verbal cues Chair/bed transfer assistive device: Medical sales representative Ambulation activity did not occur: Safety/medical concerns         Wheelchair   Type: Manual Max wheelchair distance: 200 Assist Level: Supervision or verbal cues  Cognition Comprehension Comprehension assist level: Follows complex conversation/direction with extra time/assistive device  Expression Expression assist level: Expresses complex 90% of the time/cues < 10% of the time  Social Interaction Social Interaction assist level: Interacts appropriately 90% of the time - Needs monitoring or encouragement for participation or interaction.  Problem Solving Problem solving assist level: Solves basic problems with no assist  Memory Memory assist level: Recognizes or recalls 90% of the time/requires cueing < 10% of the time   Medical Problem List and Plan: 1.  Mobility and functional deficits secondary to polytrauma as above  -continue CIR therapies 2.  DVT Prophylaxis/Anticoagulation: Pharmaceutical: Lovenox  Dopplers neg for DVT on 9/3 3. Pain Management: Oxycodone prn effective.  4. Mood: LCSW to follow for evaluation and support.  5. Neuropsych: This patient is capable of making decisions on his own behalf. 6. Skin/Wound Care: Monitor incisions for healing.  7. Fluids/Electrolytes/Nutrition: Monitor I/O. Encourage po.   Changed to full liquid diet per pt request 8. L3 endplate fracture: Back precautions --don LSO when patient supine 9. ABLA:   Hb 8.4 on 9/7  Cont to monitor  10. Fevers: Resolved  Encourage IS  -UA negative, UCX negative 11.  Urinary retention:  Likely due to  pelvic hematoma, immobility, constipation.   -continue flomax, will consider increase tomorrow  -Urecholine 10 TID started on 9/5, increased to 25 on 9/6, increased to 50 on 9/7  -foley d/ced 9/4 12. T2DM: Monitor BS ac/hs.   On metformin, tradjenta  Started Amaryl 1mg  on 9/4, increased 2mg  on 9/6  Improving  Use SSI for elevated BS.  13. HTN: Will monitor BP bid  Continue avapro, norvasc and HCTZ.   Fairly controlled at present  Monitor with increased activity 14. Constipation:  Improved  miralax bid, increased on 9/5  constipation on KUB 15. Hyponatremia  Na 133 on 9/7 (stable)  Cont to monitor  LOS (Days) 6 A FACE TO FACE EVALUATION WAS PERFORMED  Sreekar Broyhill Lorie Phenix 06/05/2016 8:56 AM

## 2016-06-05 NOTE — Progress Notes (Signed)
Social Work Patient ID: Corey Sellers, male   DOB: 1951-07-16, 65 y.o.   MRN: [id number]   Have reviewed team conference information with pt and his wife (via phone).  Both aware of the targeted d/c date of 9/15.  Both concerned that pt may not "be ready" for d/c at that time.  Wife states, "I'm afraid I won't be able to move him.Marland Kitchenand what if he has appointments and I can't get him in and out of the house?"  Wife with many questions about her own abilities to assist as well as the fact that she works f/t mon - fri.  Discussion after team conference among therapies about whether pt could truly reach a mod ind w/c level of function.  At this point, I have asked that wife attend therapy sessions to discuss concerns with staff.  She plans to be here Sat and Sun.  Will coordinate tx times with team and wife.  Continue to follow.  Alonah Lineback, LCSW

## 2016-06-05 NOTE — Progress Notes (Signed)
Physical Therapy Session Note  Patient Details  Name: Corey Sellers MRN: [id number] Date of Birth: 1950-11-13  Today's Date: 06/05/2016 PT Individual Time: 1105-1200 PT Individual Time Calculation (min): 55 min    Short Term Goals: Week 1:  PT Short Term Goal 1 (Week 1): Patient will perform bed mobility with supervision A and flat bed.  PT Short Term Goal 2 (Week 1): Patient will transfer to [facility] with stand pivot/squat pivot with supervision A.  PT Short Term Goal 3 (Week 1): Patient will initiate gait training with LRAD PT Short Term Goal 4 (Week 1): Patient will initiate stair training for access into house.   Skilled Therapeutic Interventions/Progress Updates:    Pt received resting in bed, no c/o pain but expresses frustration with lack of opportunities to sleep throughout the day, agreeable to therapy with encouragement.  Roll to L to place TLSO with supervision using bed rails, PT donned TLSO total assist.  Supine>sit with bed rails, more than a reasonable amount of time, and verbal cues to reach R for balance.  Stand/pivot with RW and steady assist for balance to w/c.  PT switched out reclining w/c to standard manual w/c with tension back for improved comfort.  Pt propelled w/c max 200' with supervision fade to mod I.  Pt requesting to sit in day room in w/c with computer at end of session.  Needs met.    Therapy Documentation Precautions:  Precautions Precautions: Back, Fall Precaution Booklet Issued: No Precaution Comments: Patient unable to recall back precautions.  Required Braces or Orthoses: Spinal Brace Spinal Brace: Thoracolumbosacral orthotic, Applied in supine position Restrictions Weight Bearing Restrictions: Yes RLE Weight Bearing: Non weight bearing LLE Weight Bearing: Weight bearing as tolerated (for transfers only ) Other Position/Activity Restrictions: Pt could maintain NWB on RLE with 2 person assist   See Function Navigator for Current Functional  Status.   Therapy/Group: Individual Therapy  Earnest Conroy Penven[place]Crew 06/05/2016, 12:13 PM

## 2016-06-05 NOTE — Progress Notes (Signed)
Occupational Therapy Session Note  Patient Details  Name: Corey Sellers MRN: [id number] Date of Birth: 09-10-1951  Today's Date: 06/05/2016 OT Individual Time: [id number] and [id number] OT Individual Time Calculation (min): 84 min and 58 min    Short Term Goals: Week 1:  OT Short Term Goal 1 (Week 1): Complete toileting using AE with min assist OT Short Term Goal 2 (Week 1): Bathe sitting using lateral leans to wash buttocks and AE to extend reach with mod assist OT Short Term Goal 3 (Week 1): Don TLSO with min assist while supine OT Short Term Goal 4 (Week 1): Dress lower body using AE, prn, with mod assist OT Short Term Goal 5 (Week 1): Transfer to [facility] with steadying assist  Skilled Therapeutic Interventions/Progress Updates:    Session 1: Upon entering the room, pt supine in bed and requesting to shower. Pt is unable to get into bathroom at home with wheelchair but shower completed this session for hygiene purposes. Pr rolled L<> R in order to don TLSO brace in supine. Pt transferred into wheelchair with min A stand pivot transfer with use of RW. Pt transferred onto tub transfer bench from wheelchair with mod lifting assistance with use of grab bar. Pt seated on TTB for bathing at shower level with use of long handled reacher and TLSO remaining donned. Pt transferred back into wheelchair and to bed in same manner as above. Pt utilized sock aide to don B socks with set up A.  Pt rolled in order for therapist to remove TLSO and change pads. Pt engaged in dressing from supine secondary to fatigue and precautions. Min A to pull shirt down trunk and pt needing assistance to don pants over B feet and hips. Pt requesting to remain supine secondary to fatigue. Call bell and all needed items within reach upon exiting the room.   Session 2: Upon entering the room, pt supine in bed sleeping but agreeable to OT intervention. Pt requesting assistance to transfer onto toilet for BM. Pt rolling L <> R with use  of bed rails and supervision in order for OT to assist pt with donning TLSO. Supine>sit with min A for trunk to edge of bed. Pt performed stand pivot transfer with RW and min A to wheelchair. OT propelled wheelchair into bathroom for mod A stand pivot transfer with use of grab bar onto elevated toilet seat. Pt required assist for clothing management and hygiene this session. Once returning to wheelchair, pt requesting to brush teeth and wash hands at sink with supervision. Pt transferring back onto bed at end of session with steady assist for balance. Call bell and all needed items within reach upon exiting the room.   Therapy Documentation Precautions:  Precautions Precautions: Back, Fall Precaution Booklet Issued: No Precaution Comments: Patient unable to recall back precautions.  Required Braces or Orthoses: Spinal Brace Spinal Brace: Thoracolumbosacral orthotic, Applied in supine position Restrictions Weight Bearing Restrictions: Yes RLE Weight Bearing: Non weight bearing LLE Weight Bearing: Weight bearing as tolerated (for transfers only ) Other Position/Activity Restrictions: Pt could maintain NWB on RLE with 2 person assist General:   Vital Signs:  Pain:   ADL: ADL ADL Comments: see Functional Assessment Exercises:   Other Treatments:    See Function Navigator for Current Functional Status.   Therapy/Group: Individual Therapy  Phineas Semen 06/05/2016, 9:26 AM

## 2016-06-06 ENCOUNTER — Inpatient Hospital Stay (HOSPITAL_COMMUNITY): Payer: Self-pay | Admitting: Occupational Therapy

## 2016-06-06 ENCOUNTER — Inpatient Hospital Stay (HOSPITAL_COMMUNITY): Payer: BLUE CROSS/BLUE SHIELD | Admitting: Physical Therapy

## 2016-06-06 ENCOUNTER — Inpatient Hospital Stay (HOSPITAL_COMMUNITY): Payer: Self-pay | Admitting: Physical Therapy

## 2016-06-06 DIAGNOSIS — N5089 Other specified disorders of the male genital organs: Secondary | ICD-10-CM

## 2016-06-06 LAB — GLUCOSE, CAPILLARY
GLUCOSE-CAPILLARY: 117 mg/dL — AB (ref 65–99)
GLUCOSE-CAPILLARY: 151 mg/dL — AB (ref 65–99)
GLUCOSE-CAPILLARY: 131 mg/dL — AB (ref 65–99)
Glucose-Capillary: 182 mg/dL — ABNORMAL HIGH (ref 65–99)
Glucose-Capillary: 125 mg/dL — ABNORMAL HIGH (ref 65–99)

## 2016-06-06 MED ORDER — FUROSEMIDE 20 MG PO TABS
20.0000 mg | ORAL_TABLET | Freq: Every day | ORAL | Status: DC
  Administered 2016-06-06 – 2016-06-13 (×8): 20 mg via ORAL
  Filled 2016-06-06 (×8): qty 1

## 2016-06-06 NOTE — Progress Notes (Signed)
[place] PHYSICAL MEDICINE & REHABILITATION     PROGRESS NOTE    Subjective/Complaints: Pt seen laying in bed this AM.  He states he feels terrible because he didn't sleep well due to vivid dreams.  His abd pain as resolved.   ROS: +sleep disturbance. Denies CP, SOB, N/V/D.   Objective: Vital Signs: Blood pressure (!) 144/64, pulse 100, temperature 98.2 F (36.8 C), temperature source Oral, resp. rate 18, height 5\' 9"  (1.753 m), weight 109.3 kg (240 lb 15.4 oz), SpO2 92 %. No results found.  Recent Labs  06/05/16 0508  WBC 7.5  HGB 8.4*  HCT 27.0*  PLT 227    Recent Labs  06/05/16 0508  NA 133*  K 4.3  CL 98*  GLUCOSE 119*  BUN 19  CREATININE 0.98  CALCIUM 9.1   CBG (last 3)   Recent Labs  06/05/16 1227 06/05/16 1638 06/05/16 2009  GLUCAP 142* 147* 117*    Wt Readings from Last 3 Encounters:  06/03/16 109.3 kg (240 lb 15.4 oz)  05/24/16 112 kg (247 lb)  12/28/13 106.1 kg (234 lb)    Physical Exam:  Constitutional: He appears well-developed and well-nourished.  NAD.  HENT: Normocephalic and atraumatic.  Eyes: Conjunctivae and EOM are normal.  Cardiovascular: Normal rate and regular rhythm.  Exam reveals no gallop and no friction rub.  No murmur heard. Respiratory: Effort normal and breath sounds normal. No stridor. No respiratory distress. He has no wheezes.  GI: Bowel sounds are present. There is no tenderness.  Musculoskeletal: He exhibits scrotal edema (slowly improving). He exhibits no tenderness.  Neurological: He is alert and oriented.  Speech clear.  Able to follow basic commands without difficulty.  UE strength grossly 5/5.  RLE: 4+/5HF , 4+/5 KE and 5/5 ADF/PF.  LLE: 4+/5HF , 4+/5 KE and 5/5 ADF/PF.  No sensory changes.  Skin: Skin is warm and dry.  Hypogastric incision intact with sutures, c/d/i. Right hip incision with suture c/d/i. Psychiatric: He has a flat. mood and affect. His behavior is normal. Thought content normal.     Assessment/Plan: 1. Mobility and functional deficits secondary to polytrauma which require 3+ hours per day of interdisciplinary therapy in a comprehensive inpatient rehab setting. Physiatrist is providing close team supervision and 24 hour management of active medical problems listed below. Physiatrist and rehab team continue to assess barriers to discharge/monitor patient progress toward functional and medical goals.  Function:  Bathing Bathing position   Position: Shower  Bathing parts Body parts bathed by patient: Right arm, Left arm, Front perineal area, Right upper leg, Left upper leg, Right lower leg, Left lower leg Body parts bathed by helper: Buttocks  Bathing assist Assist Level: Touching or steadying assistance(Pt > 75%)      Upper Body Dressing/Undressing Upper body dressing   What is the patient wearing?: Pull over shirt/dress, Orthosis     Pull over shirt/dress - Perfomed by patient: Put head through opening, Thread/unthread left sleeve, Thread/unthread right sleeve Pull over shirt/dress - Perfomed by helper: Pull shirt over trunk     Orthosis activity level: Performed by helper  Upper body assist Assist Level: Touching or steadying assistance(Pt > 75%)      Lower Body Dressing/Undressing Lower body dressing   What is the patient wearing?: Pants, Non-skid slipper socks     Pants- Performed by patient: Thread/unthread right pants leg, Thread/unthread left pants leg Pants- Performed by helper: Thread/unthread right pants leg, Thread/unthread left pants leg, Pull pants up/down Non-skid slipper  socks- Performed by patient: Don/doff right sock, Don/doff left sock Non-skid slipper socks- Performed by helper: Don/doff right sock, Don/doff left sock                  Lower body assist Assist for lower body dressing: Assistive device, Touching or steadying assistance (Pt > 75%) Assistive Device Comment: sock aid    Toileting Toileting Toileting activity did  not occur: Refused   Toileting steps completed by helper: Adjust clothing prior to toileting, Performs perineal hygiene, Adjust clothing after toileting    Toileting assist Assist level:  (total A)   Transfers Chair/bed transfer   Chair/bed transfer method: Stand pivot Chair/bed transfer assist level: Touching or steadying assistance (Pt > 75%) Chair/bed transfer assistive device: Armrests, Medical sales representative Ambulation activity did not occur: Safety/medical concerns         Wheelchair   Type: Manual Max wheelchair distance: 150 Assist Level: Supervision or verbal cues  Cognition Comprehension Comprehension assist level: Follows complex conversation/direction with extra time/assistive device  Expression Expression assist level: Expresses complex 90% of the time/cues < 10% of the time  Social Interaction Social Interaction assist level: Interacts appropriately 90% of the time - Needs monitoring or encouragement for participation or interaction.  Problem Solving Problem solving assist level: Solves basic problems with no assist  Memory Memory assist level: Recognizes or recalls 90% of the time/requires cueing < 10% of the time   Medical Problem List and Plan: 1.  Mobility and functional deficits secondary to polytrauma as above  -continue CIR therapies 2.  DVT Prophylaxis/Anticoagulation: Pharmaceutical: Lovenox  Dopplers neg for DVT on 9/3 3. Pain Management: Oxycodone prn effective.  4. Mood: LCSW to follow for evaluation and support.  5. Neuropsych: This patient is capable of making decisions on his own behalf. 6. Skin/Wound Care: Monitor incisions for healing.  7. Fluids/Electrolytes/Nutrition: Monitor I/O. Encourage po.   Changed to full liquid diet per pt request 8. L3 endplate fracture: Back precautions --don LSO when patient supine 9. ABLA:   Hb 8.4 on 9/7  Cont to monitor  10. Fevers: Resolved  Encourage IS  -UA negative, UCX negative 11.  Urinary  retention:  Likely due to pelvic hematoma.   -continue flomax  -Urecholine 10 TID started on 9/5, increased to 25 on 9/6, increased to 50 on 9/7  -foley d/ced 9/4 12. T2DM: Monitor BS ac/hs.   On metformin, tradjenta  Started Amaryl 1mg  on 9/4, increased 2mg  on 9/6  Improving  Use SSI for elevated BS.  13. HTN: Monitor BP   Continue avapro, norvasc and HCTZ.   Fairly controlled at present  Monitor with increased activity 14. Constipation:  Improved  miralax bid, increased on 9/5  constipation on KUB 15. Hyponatremia  Na 133 on 9/7 (stable)  Cont to monitor 16. Scrotal edema  Will start lasix 20mg  daily on 9/8  LOS (Days) 7 A FACE TO FACE EVALUATION WAS PERFORMED  Majesta Leichter Lorie Phenix 06/06/2016 8:28 AM

## 2016-06-06 NOTE — Progress Notes (Signed)
Occupational Therapy Weekly Progress Note  Patient Details  Name: Corey Sellers MRN: [id number] Date of Birth: 1951-02-21  Beginning of progress report period: 05/31/16 End of progress report period: 06/06/16 Today's Date: 06/06/2016  Patient has met 4 of 5 short term goals.   Patient continues to demonstrate the following deficits: activity tolerance, adherence to precautions, UB strength, and dynamic standing balance and therefore will continue to benefit from skilled OT intervention to enhance overall performance with BADL.  Corey Sellers has improved ADL capabilities with consistent carryover demonstration of appropriate use of AE while adhering to spinal and R LE NWB precautions. Pt does still require cuing for NWB during functional transfers. Pt also has not yet experienced toileting with OT in which the use of toilet aide was necessary; therefore simulated toileting is reflected in FIM at this time. This weekend pts spouse will be present during therapy for d/c planning, orthosis mgt, and ADL safety education. Overall, pt is continuing to make progress and anticipating discharge this week.   Patient progressing toward long term goals..  Continue plan of care. See POC for slight revisions.   OT Short Term Goals Week 2:  OT Short Term Goal 1 (Week 2): STGs=LTGs due to ELOS   Skilled Therapeutic Interventions/Progress Updates:   Please refer to signed occupational therapy note on 9/8 for therapeutic interventions.   Therapy Documentation Precautions:  Precautions Precautions: Back, Fall Precaution Booklet Issued: No Precaution Comments: Patient unable to recall back precautions.  Required Braces or Orthoses: Spinal Brace Spinal Brace: Thoracolumbosacral orthotic, Applied in supine position Restrictions Weight Bearing Restrictions: Yes RLE Weight Bearing: Non weight bearing LLE Weight Bearing: Weight bearing as tolerated Other Position/Activity Restrictions: Pt could maintain NWB on RLE  with 2 person assist General: General PT Missed Treatment Reason: Pain;Patient fatigue Vital Signs:  Pain: Pain Assessment Pain Score: 3  ADL: ADL ADL Comments: see Functional Assessment :    See Function Navigator for Current Functional Status.   Therapy/Group: Individual Therapy  Corey Sellers Corey Sellers 06/06/2016, 6:17 PM

## 2016-06-06 NOTE — Progress Notes (Signed)
Physical Therapy Weekly Progress Note  Patient Details  Name: Corey Sellers MRN: [id number] Date of Birth: May 09, 1951  Beginning of progress report period: May 31, 2016 End of progress report period: June 06, 2016  Today's Date: 06/06/2016 PT Individual Time: 1105-1200 PT Individual Time Calculation (min): 55 min   Patient has met 1 of 4 short term goals.  Pt continues to require multimodal cues and intermittent steady assist for stand/pivot transfers.  Resistant to education for squat/pivot transfers.  Discontinued goals for ambulation and stairs as pt has restrictions for transfers only when weight bearing through LLE.   Patient continues to demonstrate the following deficits: sequencing, recall of precautions, balance, and strength and therefore will continue to benefit from skilled PT intervention to enhance overall performance with activity tolerance, balance, ability to compensate for deficits, awareness and knowledge of precautions.  Patient progressing toward long term goals..  Plan of care revisions: discontinued gait and stair goal.  PT Short Term Goals Week 1:  PT Short Term Goal 1 (Week 1): Patient will perform bed mobility with supervision A and flat bed.  PT Short Term Goal 1 - Progress (Week 1): Met PT Short Term Goal 2 (Week 1): Patient will transfer to [facility] with stand pivot/squat pivot with supervision A.  PT Short Term Goal 2 - Progress (Week 1): Not met PT Short Term Goal 3 (Week 1): Patient will initiate gait training with LRAD PT Short Term Goal 3 - Progress (Week 1): Discontinued (comment) (pt NWB on RLE and WBAT on LLE for transfers only) PT Short Term Goal 4 (Week 1): Patient will initiate stair training for access into house.  PT Short Term Goal 4 - Progress (Week 1): Discontinued (comment) (Pt NWB on RLE and WBAT on LLE for transfers only) Week 2:  PT Short Term Goal 1 (Week 2): =LTGs due to ELOS   Skilled Therapeutic Interventions/Progress Updates:   Pt  received resting in w/c, no c/o pain, and agreeable to therapy session.  Session focus on transfers, maintaining weight bearing restrictions, strengthening, and pt education.  Pt propelled w/c to and from ortho gym for UE strength and overall activity tolerance.  PT instructed pt in simulated car transfer with heavy duty RW and steady assist enter car and close supervision to exit car with verbal cues for hand placement and sequencing.  PT instructed pt in BLE therex 2x15 reps ankle circles, LAQ, hip flexion, and chair push ups.  Pt returned to room at end of session requesting to toilet.  Stand/pivot from w/c>BSC with light weight RW and steady assist.  Pt requested to be left sitting on BSC for privacy, call bell in reach and needs met.  NT alerted to pt position.   Therapy Documentation Precautions:  Precautions Precautions: Back, Fall Precaution Booklet Issued: No Precaution Comments: Patient unable to recall back precautions.  Required Braces or Orthoses: Spinal Brace Spinal Brace: Thoracolumbosacral orthotic, Applied in supine position Restrictions Weight Bearing Restrictions: Yes RLE Weight Bearing: Non weight bearing LLE Weight Bearing: Weight bearing as tolerated Other Position/Activity Restrictions: Pt could maintain NWB on RLE with 2 person assist   See Function Navigator for Current Functional Status.  Therapy/Group: Individual Therapy  Carrolyn Hilmes E Penven-Crew 06/06/2016, 11:17 AM

## 2016-06-06 NOTE — Progress Notes (Signed)
Occupational Therapy Session Note  Patient Details  Name: Corey Sellers MRN: [id number] Date of Birth: September 19, 1951  Today's Date: 06/06/2016 OT Individual [id number] OT Individual Time Calculation (min): 46 min and 58 minutes  Short Term Goals: Week 1:  OT Short Term Goal 1 (Week 1): Complete toileting using AE with min assist OT Short Term Goal 2 (Week 1): Bathe sitting using lateral leans to wash buttocks and AE to extend reach with mod assist OT Short Term Goal 3 (Week 1): Don TLSO with min assist while supine OT Short Term Goal 4 (Week 1): Dress lower body using AE, prn, with mod assist OT Short Term Goal 5 (Week 1): Transfer to [facility] with steadying assist Week 2:     Skilled Therapeutic Interventions/Progress Updates:   Pt participated in skilled OT session focusing on d/c planning, ADL retraining, activity tolerance, and adherence to spinal/NWB precautions. Pt-therapist collaboration completed regarding safe ADL setup in home. Per pt report, pt will be receiving hospital bed and pt will primarily reside in room with bed, commode, and chair at time of discharge until WB precautions are lifted. Pt completed ADLs at EOB and bedlevel with setup to simulate home completion. Pt able to complete with overall supervision for instruction on sequencing, adherence to precautions, and setup of washcloths. Pt reported that wife Tye [place] will be attending therapy session this weekend and plans were made to go over safe ADL setup. Pt completed oral care/grooming tasks w/c level at sink after back brace was donned in supine. Pt was left in w/c with all needs within reach at end of tx.   2nd Session 1:1 Tx (46 minutes) Pt participated in skilled OT session focusing on d/c planning, adherence to precautions, toilet transfers and simulated toileting. Pt was provided education on [facility] options for home with verbalized understanding with OT recommendation of bariatric drop arm commode. Stand pivot transfer  completed with RW and steady assist to drop arm commode.Adaptive foostool provided to assist pt with having L LE support. Pt was provided education on use of toilet aide with simulated use and pt requiring extra time and compensatory strategy training for lateral lean technique without violating spinal precautions. Pt was educated on additional toilet aide options and placement of BSC beside controls of hospital bed to manipulate bed height to assist with leaning. At end of session, pt was returned to bed per request. Back brace doffed and all needs within reach at time of departure. Education on energy conservation provided throughout treatment.    Therapy Documentation Precautions:  Precautions Precautions: Back, Fall Precaution Booklet Issued: No Precaution Comments: Patient unable to recall back precautions.  Required Braces or Orthoses: Spinal Brace Spinal Brace: Thoracolumbosacral orthotic, Applied in supine position Restrictions Weight Bearing Restrictions: Yes RLE Weight Bearing: Non weight bearing LLE Weight Bearing: Weight bearing as tolerated Other Position/Activity Restrictions: Pt could maintain NWB on RLE with 2 person assist General: General PT Missed Treatment Reason: Pain;Patient fatigue Vital Signs: Therapy Vitals Temp: 98.8 F (37.1 C) Temp Source: Oral Pulse Rate: (!) 107 Resp: 18 BP: (!) 130/59 Patient Position (if appropriate): Lying Oxygen Therapy SpO2: 95 % O2 Device: Not Delivered Pain: Pt reported pain to be manageable during session with provided rest breaks    ADL: ADL ADL Comments: see Functional Assessment     See Function Navigator for Current Functional Status.   Therapy/Group: Individual Therapy  Shandreka Dante A Waneda Klammer 06/06/2016, 4:09 PM

## 2016-06-06 NOTE — Progress Notes (Signed)
Social Work Patient ID: Corey Sellers, male   DOB: 1951-06-24, 65 y.o.   MRN: [id number]  Have scheduled for pt's wife to attend PT session tomorrow morning for further education and to address mobility questions.  Broden Holt, LCSW

## 2016-06-06 NOTE — Progress Notes (Signed)
Physical Therapy Session Note  Patient Details  Name: Corey Sellers MRN: [id number] Date of Birth: 26-Jun-1951  Today's Date: 06/06/2016 PT Individual Time: 1515-1530 PT Individual Time Calculation (min): 15 min  and Today's Date: 06/06/2016 PT Missed Time: 15 Minutes Missed Time Reason: Pain;Patient fatigue    Short Term Goals: Week 2:  PT Short Term Goal 1 (Week 2): =LTGs due to ELOS  Skilled Therapeutic Interventions/Progress Updates:    Pt in bed upon arrival, declining out of bed therapy.  PT suggested practicing EOB lateral leans to simulate clothing management for toileting at home when alone during the day; pt states "I won't have to manage clothing at home because I don't wear clothes at home." Education for use of lateral leans for hygiene as well and pt states, "I usually don't have any residue for that." Discussed w/c bumping in and out of house with pt and pt was able to teach back to therapist.  Pt continued to decline out of bed to chair.  Left semi reclined in bed with call bell in reach and needs met.   Therapy Documentation Precautions:  Precautions Precautions: Back, Fall Precaution Booklet Issued: No Precaution Comments: Patient unable to recall back precautions.  Required Braces or Orthoses: Spinal Brace Spinal Brace: Thoracolumbosacral orthotic, Applied in supine position Restrictions Weight Bearing Restrictions: Yes RLE Weight Bearing: Non weight bearing LLE Weight Bearing: Weight bearing as tolerated Other Position/Activity Restrictions: Pt could maintain NWB on RLE with 2 person assist   See Function Navigator for Current Functional Status.   Therapy/Group: Individual Therapy  Earnest Conroy Penven-Crew 06/06/2016, 3:33 PM

## 2016-06-07 ENCOUNTER — Inpatient Hospital Stay (HOSPITAL_COMMUNITY): Payer: Self-pay | Admitting: Physical Therapy

## 2016-06-07 DIAGNOSIS — E119 Type 2 diabetes mellitus without complications: Secondary | ICD-10-CM

## 2016-06-07 DIAGNOSIS — E669 Obesity, unspecified: Secondary | ICD-10-CM

## 2016-06-07 DIAGNOSIS — E1169 Type 2 diabetes mellitus with other specified complication: Secondary | ICD-10-CM | POA: Insufficient documentation

## 2016-06-07 LAB — GLUCOSE, CAPILLARY
Glucose-Capillary: 138 mg/dL — ABNORMAL HIGH (ref 65–99)
Glucose-Capillary: 158 mg/dL — ABNORMAL HIGH (ref 65–99)
Glucose-Capillary: 158 mg/dL — ABNORMAL HIGH (ref 65–99)

## 2016-06-07 MED ORDER — GLIMEPIRIDE 2 MG PO TABS
4.0000 mg | ORAL_TABLET | Freq: Every day | ORAL | Status: DC
  Administered 2016-06-08 – 2016-06-13 (×6): 4 mg via ORAL
  Filled 2016-06-07 (×6): qty 2

## 2016-06-07 NOTE — Progress Notes (Signed)
[place] PHYSICAL MEDICINE & REHABILITATION     PROGRESS NOTE    Subjective/Complaints: Pt laying in bed this AM.  He states he finally got a good night's sleep.  He also requested to have his diet changed back to a regular consistency diet.  ROS: Denies CP, SOB, N/V/D.   Objective: Vital Signs: Blood pressure (!) 127/43, pulse 92, temperature 98.4 F (36.9 C), temperature source Oral, resp. rate 18, height 5\' 9"  (1.753 m), weight 109.3 kg (240 lb 15.4 oz), SpO2 96 %. No results found.  Recent Labs  06/05/16 0508  WBC 7.5  HGB 8.4*  HCT 27.0*  PLT 227    Recent Labs  06/05/16 0508  NA 133*  K 4.3  CL 98*  GLUCOSE 119*  BUN 19  CREATININE 0.98  CALCIUM 9.1   CBG (last 3)   Recent Labs  06/06/16 2110 06/07/16 0704 06/07/16 1136  GLUCAP 131* 138* 158*    Wt Readings from Last 3 Encounters:  06/03/16 109.3 kg (240 lb 15.4 oz)  05/24/16 112 kg (247 lb)  12/28/13 106.1 kg (234 lb)    Physical Exam:  Constitutional: He appears well-developed and well-nourished.  NAD.  HENT: Normocephalic and atraumatic.  Eyes: Conjunctivae and EOM are normal.  Cardiovascular: Normal rate and regular rhythm.  Exam reveals no gallop and no friction rub.  No murmur heard. Respiratory: Effort normal and breath sounds normal. No stridor. No respiratory distress. He has no wheezes.  GI: Bowel sounds are present. There is no tenderness.  Musculoskeletal: He exhibits scrotal edema (slowly improving). He exhibits no tenderness.  Neurological: He is alert and oriented.  Speech clear.  Able to follow basic commands without difficulty.  UE strength grossly 5/5.  RLE: 4+/5HF , 4+/5 KE and 5/5 ADF/PF.  LLE: 4+/5HF , 4+/5 KE and 5/5 ADF/PF.  No sensory changes.  Skin: Skin is warm and dry.  Hypogastric incision intact with sutures, c/d/i. Right hip incision with suture c/d/i. Psychiatric: He has a flat. mood and affect. His behavior is normal. Thought content normal.     Assessment/Plan: 1. Mobility and functional deficits secondary to polytrauma which require 3+ hours per day of interdisciplinary therapy in a comprehensive inpatient rehab setting. Physiatrist is providing close team supervision and 24 hour management of active medical problems listed below. Physiatrist and rehab team continue to assess barriers to discharge/monitor patient progress toward functional and medical goals.  Function:  Bathing Bathing position   Position: Bed  Bathing parts Body parts bathed by patient: Right arm, Left arm, Front perineal area, Right upper leg, Left upper leg, Right lower leg, Left lower leg, Back, Buttocks, Abdomen, Chest Body parts bathed by helper: Buttocks  Bathing assist Assist Level: Supervision or verbal cues      Upper Body Dressing/Undressing Upper body dressing   What is the patient wearing?: Pull over shirt/dress, Orthosis     Pull over shirt/dress - Perfomed by patient: Thread/unthread right sleeve, Thread/unthread left sleeve, Put head through opening, Pull shirt over trunk Pull over shirt/dress - Perfomed by helper: Pull shirt over trunk     Orthosis activity level: Performed by helper  Upper body assist Assist Level: Supervision or verbal cues      Lower Body Dressing/Undressing Lower body dressing   What is the patient wearing?: Pants, Non-skid slipper socks     Pants- Performed by patient: Thread/unthread right pants leg, Thread/unthread left pants leg, Pull pants up/down Pants- Performed by helper: Thread/unthread right pants leg, Thread/unthread left  pants leg, Pull pants up/down Non-skid slipper socks- Performed by patient: Don/doff right sock, Don/doff left sock Non-skid slipper socks- Performed by helper: Don/doff right sock, Don/doff left sock                  Lower body assist Assist for lower body dressing: Supervision or verbal cues Assistive Device Comment: sock aid    Toileting Toileting Toileting  activity did not occur: Refused   Toileting steps completed by helper: Adjust clothing prior to toileting, Performs perineal hygiene, Adjust clothing after toileting    Toileting assist Assist level:  (total A)   Transfers Chair/bed transfer   Chair/bed transfer method: Stand pivot Chair/bed transfer assist level: Touching or steadying assistance (Pt > 75%) Chair/bed transfer assistive device: Armrests, Medical sales representative Ambulation activity did not occur: Safety/medical concerns         Wheelchair   Type: Manual Max wheelchair distance: 300 Assist Level: Supervision or verbal cues  Cognition Comprehension Comprehension assist level: Follows complex conversation/direction with extra time/assistive device  Expression Expression assist level: Expresses complex 90% of the time/cues < 10% of the time  Social Interaction Social Interaction assist level: Interacts appropriately 90% of the time - Needs monitoring or encouragement for participation or interaction.  Problem Solving Problem solving assist level: Solves basic problems with no assist  Memory Memory assist level: Recognizes or recalls 90% of the time/requires cueing < 10% of the time   Medical Problem List and Plan: 1.  Mobility and functional deficits secondary to polytrauma as above  -continue CIR therapies 2.  DVT Prophylaxis/Anticoagulation: Pharmaceutical: Lovenox  Dopplers neg for DVT on 9/3 3. Pain Management: Oxycodone prn effective.  4. Mood: LCSW to follow for evaluation and support.  5. Neuropsych: This patient is capable of making decisions on his own behalf. 6. Skin/Wound Care: Monitor incisions for healing.  7. Fluids/Electrolytes/Nutrition: Monitor I/O. Encourage po.   Now back to regular consistency diet 8. L3 endplate fracture: Back precautions --don LSO when patient supine 9. ABLA:   Hb 8.4 on 9/7  Cont to monitor  10. Fevers: Resolved  Encourage IS  -UA negative, UCX negative 11.   Urinary retention:  Likely due to pelvic hematoma.   -continue flomax  -Urecholine 10 TID started on 9/5, increased to 25 on 9/6, increased to 50 on 9/7  -foley d/ced 9/4 12. T2DM: Monitor BS ac/hs.   On metformin, tradjenta  Started Amaryl 1mg  on 9/4, increased 2mg  on 9/6, increased to 4mg  on 9/10  Improving  Use SSI for elevated BS.  13. HTN: Monitor BP   Continue avapro, norvasc and HCTZ.   Fairly controlled at present  Monitor with increased activity 14. Constipation:  Improved  miralax bid, increased on 9/5  constipation on KUB 15. Hyponatremia  Na 133 on 9/7 (stable)  Cont to monitor 16. Scrotal edema  Started lasix 20mg  daily on 9/8 17. Morbid obesity  Body mass index is 35.58 kg/m.  Diet and exercise education  Encourage weight loss to increase endurance and promote overall health  LOS (Days) 8 A FACE TO FACE EVALUATION WAS PERFORMED  Ankit Lorie Phenix 06/07/2016 11:42 AM

## 2016-06-07 NOTE — Progress Notes (Signed)
Occupational Therapy Session Note  Patient Details  Name: LYRIK DICKOW MRN: [id number] Date of Birth: [birth year]  Today's Date: 06/07/2016 OT Individual Time: [id number] OT Individual Time Calculation (min): 21 min    Skilled Therapeutic Interventions/Progress Updates:   Pt participated in skilled OT session focusing on standing tolerance, adherence to NWB precautions, and discharge planning. Pt was agreeable to participate in makeup therapy session due to missed time this week. Orthosis donned with pt logrolling in supine. Pt self propelled to dayroom to prepare coffee per request. Pt completed coffee prep task while standing with steady assist and cuing to maintain NWB precautions. Pt exhibited window of 20 seconds before WB through R LE with cuing to correct. Pt education completed on proper w/c placement when retrieving items from cabinets at home and problem solving completed for pt carrying kitchen items from w/c. At end of session pt was left in w/c in dayroom with computer "I have to attend to financial matters with my bank." Pt instructed on waiting for nursing to assist back to room in 1 hour. Nursing made aware of pt position. No c/o pain.   Therapy Documentation Precautions:  Precautions Precautions: Back, Fall Precaution Booklet Issued: No Precaution Comments: Patient unable to recall back precautions.  Required Braces or Orthoses: Spinal Brace Spinal Brace: Thoracolumbosacral orthotic, Applied in supine position Restrictions Weight Bearing Restrictions: Yes RLE Weight Bearing: Non weight bearing LLE Weight Bearing: Weight bearing as tolerated Other Position/Activity Restrictions: Pt could maintain NWB on RLE with 2 person assist General:   Vital Signs: Therapy Vitals Temp: 98.9 F (37.2 C) Temp Source: Oral Pulse Rate: (!) 104 Resp: 18 BP: (!) 134/50 Patient Position (if appropriate): Sitting Oxygen Therapy SpO2: 97 % O2 Device: Not Delivered   ADL: ADL ADL  Comments: see Functional Assessment    See Function Navigator for Current Functional Status.   Therapy/Group: Individual Therapy  Aasiyah Auerbach A Bellamy Rubey 06/07/2016, 4:13 PM

## 2016-06-07 NOTE — Progress Notes (Signed)
Physical Therapy Session Note  Patient Details  Name: Corey Sellers MRN: [id number] Date of Birth: 12-May-1951  Today's Date: 06/07/2016 PT Individual Time: 0907-0950 PT Individual Time Calculation (min): 43 min    Short Term Goals: Week 2:  PT Short Term Goal 1 (Week 2): =LTGs due to ELOS  Skilled Therapeutic Interventions/Progress Updates:  Pt received in bed with wife present for family education.  Educated wife on WB restrictions and spinal precautions as related to rolling in bed to don clothing and TLSO.  Pt performed rolling L and R to don shirt and shorts and TLSO with supervision but with mod verbal cues to maintain NWB RLE.  Wife assisted partially with TLSO.  Performed sup > sit with rail and supervision.  Educated wife on sequence for stand pivot bed > w/c with RW.  Pt performed with min A and verbal cues for R foot placement to maintain NWB.  Pt performed w/c mobility x 150' with supervision but then fatigued and required assistance to propel rest of way to main entrance where pt and wife educated on real car transfer sequence.  Pt and wife return demonstrated with supervision and educated wife on w/c break down and set up.  Wife reports driveway is gravel; discussed implications with pivoting with RW and on one foot.  Returned to room where pt left in w/c but requesting pain medication due to back pain.  Wife to return on Monday to practice w/c bumping up/down one platform step.  Therapy Documentation Precautions:  Precautions Precautions: Back, Fall Precaution Booklet Issued: No Precaution Comments: Patient unable to recall back precautions.  Required Braces or Orthoses: Spinal Brace Spinal Brace: Thoracolumbosacral orthotic, Applied in supine position Restrictions Weight Bearing Restrictions: Yes RLE Weight Bearing: Non weight bearing LLE Weight Bearing: Weight bearing as tolerated Other Position/Activity Restrictions: Pt could maintain NWB on RLE with 2 person assist Vital  Signs: Therapy Vitals Temp: 98.9 F (37.2 C) Temp Source: Oral Pulse Rate: (!) 104 Resp: 18 BP: (!) 134/50 Patient Position (if appropriate): Sitting Oxygen Therapy SpO2: 97 % O2 Device: Not Delivered   See Function Navigator for Current Functional Status.   Therapy/Group: Individual Therapy  Raylene Everts [facility] 06/07/2016, 12:49 PM

## 2016-06-08 ENCOUNTER — Inpatient Hospital Stay (HOSPITAL_COMMUNITY): Payer: BLUE CROSS/BLUE SHIELD | Admitting: Occupational Therapy

## 2016-06-08 LAB — GLUCOSE, CAPILLARY
GLUCOSE-CAPILLARY: 120 mg/dL — AB (ref 65–99)
GLUCOSE-CAPILLARY: 110 mg/dL — AB (ref 65–99)
GLUCOSE-CAPILLARY: 162 mg/dL — AB (ref 65–99)
GLUCOSE-CAPILLARY: 142 mg/dL — AB (ref 65–99)

## 2016-06-08 NOTE — Progress Notes (Signed)
Occupational Therapy Session Note  Patient Details  Name: Corey Sellers MRN: [id number] Date of Birth: 10-17-1950  Today's Date: 06/08/2016 OT Individual Time: 0915-1000 OT Individual Time Calculation (min): 45 min     Short Term Goals:Week 1:  OT Short Term Goal 1 (Week 1): Complete toileting using AE with min assist OT Short Term Goal 1 - Progress (Week 1): Partly met OT Short Term Goal 2 (Week 1): Bathe sitting using lateral leans to wash buttocks and AE to extend reach with mod assist OT Short Term Goal 2 - Progress (Week 1): Met OT Short Term Goal 3 (Week 1): Don TLSO with min assist while supine OT Short Term Goal 3 - Progress (Week 1): Discontinued (comment) OT Short Term Goal 4 (Week 1): Due to spinal precautions  OT Short Term Goal 4 - Progress (Week 1): Met OT Short Term Goal 5 (Week 1): Transfer to [facility] with steadying assist OT Short Term Goal 5 - Progress (Week 1): Met Week 2:  OT Short Term Goal 1 (Week 2): STGs=LTGs due to ELOS      Skilled Therapeutic Interventions/Progress Updates:    Pt seen for skilled OT to facilitate family/pt education with pt's spouse for donning TLSO, facilitating transfers, use of toilet aid, and standing balance exercise.  Pt received in bed stating he did not need to B/D but did want to work on transfers with his spouse. Pt's spouse actively involved today. She worked on Comptroller in supine with cues from therapist for positioning. From EOB to w/c, pt completed stand pivot with RW maintaining R NWB.  He then practiced to the [facility]. Reviewed tips and tricks for preparation of toilet aid and BSC for easy cleaning and management.  Pt practiced standing with RW alternating hands towards hips to simulate pulling up pants. Pt reports that he plans to wear housecoats and no underwear at home.  Pt transferred back to w/c. Sat at sink for oral care. All needs met at this time.    Therapy Documentation Precautions:  Precautions Precautions: Back,  Fall Precaution Booklet Issued: No Precaution Comments: Patient unable to recall back precautions.  Required Braces or Orthoses: Spinal Brace Spinal Brace: Thoracolumbosacral orthotic, Applied in supine position Restrictions Weight Bearing Restrictions: Yes RLE Weight Bearing: Non weight bearing LLE Weight Bearing: Weight bearing as tolerated Other Position/Activity Restrictions: Pt could maintain NWB on RLE with 2 person assist  Pain: Pain Assessment Pain Assessment: No/denies pain ADL: ADL ADL Comments: see Functional Assessment  See Function Navigator for Current Functional Status.   Therapy/Group: Individual Therapy  [place] 06/08/2016, 12:41 PM

## 2016-06-08 NOTE — Progress Notes (Signed)
[place] PHYSICAL MEDICINE & REHABILITATION     PROGRESS NOTE    Subjective/Complaints: Pt sitting up in bed this AM having just been cathed.  He is sleeping better now.  His scrotal edema is also improving.  He complains of scrotal tenderness.    ROS: +Scrotal tenderness. Denies CP, SOB, N/V/D.   Objective: Vital Signs: Blood pressure (!) 134/50, pulse (!) 104, temperature 98.9 F (37.2 C), temperature source Oral, resp. rate 18, height 5\' 9"  (1.753 m), weight 109.3 kg (240 lb 15.4 oz), SpO2 97 %. No results found. No results for input(s): WBC, HGB, HCT, PLT in the last 72 hours. No results for input(s): NA, K, CL, GLUCOSE, BUN, CREATININE, CALCIUM in the last 72 hours.  Invalid input(s): CO CBG (last 3)   Recent Labs  06/07/16 1651 06/07/16 2020 06/08/16 0615  GLUCAP 158* 120* 110*    Wt Readings from Last 3 Encounters:  06/03/16 109.3 kg (240 lb 15.4 oz)  05/24/16 112 kg (247 lb)  12/28/13 106.1 kg (234 lb)    Physical Exam:  Constitutional: He appears well-developed and well-nourished.  NAD.  HENT: Normocephalic and atraumatic.  Eyes: Conjunctivae and EOM are normal.  Cardiovascular: Normal rate and regular rhythm.  Exam reveals no gallop and no friction rub.  No murmur heard. Respiratory: Effort normal and breath sounds normal. No stridor. No respiratory distress. He has no wheezes.  GI: Bowel sounds are present. There is no tenderness.  Musculoskeletal: He exhibits scrotal edema (improving). He exhibits no tenderness.  Neurological: He is alert and oriented.  Speech clear.  Able to follow basic commands without difficulty.  UE strength grossly 5/5.  RLE: 4+/5HF , 4+/5 KE and 5/5 ADF/PF.  LLE: 4+/5HF , 4+/5 KE and 5/5 ADF/PF.  No sensory changes.  Skin: Skin is warm and dry.  Hypogastric incision intact with sutures, c/d/i. Right hip incision with suture c/d/i. Psychiatric: He has a flat. mood and affect. His behavior is normal. Thought content normal.     Assessment/Plan: 1. Mobility and functional deficits secondary to polytrauma which require 3+ hours per day of interdisciplinary therapy in a comprehensive inpatient rehab setting. Physiatrist is providing close team supervision and 24 hour management of active medical problems listed below. Physiatrist and rehab team continue to assess barriers to discharge/monitor patient progress toward functional and medical goals.  Function:  Bathing Bathing position   Position: Bed  Bathing parts Body parts bathed by patient: Right arm, Left arm, Front perineal area, Right upper leg, Left upper leg, Right lower leg, Left lower leg, Back, Buttocks, Abdomen, Chest Body parts bathed by helper: Buttocks  Bathing assist Assist Level: Supervision or verbal cues      Upper Body Dressing/Undressing Upper body dressing   What is the patient wearing?: Pull over shirt/dress, Orthosis     Pull over shirt/dress - Perfomed by patient: Thread/unthread right sleeve, Thread/unthread left sleeve, Put head through opening, Pull shirt over trunk Pull over shirt/dress - Perfomed by helper: Pull shirt over trunk     Orthosis activity level: Performed by helper  Upper body assist Assist Level: Supervision or verbal cues      Lower Body Dressing/Undressing Lower body dressing   What is the patient wearing?: Pants, Non-skid slipper socks     Pants- Performed by patient: Thread/unthread right pants leg, Thread/unthread left pants leg, Pull pants up/down Pants- Performed by helper: Thread/unthread right pants leg, Thread/unthread left pants leg, Pull pants up/down Non-skid slipper socks- Performed by patient: Don/doff right  sock, Don/doff left sock Non-skid slipper socks- Performed by helper: Don/doff right sock, Don/doff left sock                  Lower body assist Assist for lower body dressing: Supervision or verbal cues Assistive Device Comment: sock aid    Toileting Toileting Toileting  activity did not occur: Refused   Toileting steps completed by helper: Adjust clothing prior to toileting, Performs perineal hygiene, Adjust clothing after toileting    Toileting assist Assist level:  (total A)   Transfers Chair/bed transfer   Chair/bed transfer method: Stand pivot Chair/bed transfer assist level: Touching or steadying assistance (Pt > 75%) Chair/bed transfer assistive device: Armrests, Medical sales representative Ambulation activity did not occur: Safety/medical concerns         Wheelchair   Type: Manual Max wheelchair distance: 150 Assist Level: Supervision or verbal cues  Cognition Comprehension Comprehension assist level: Follows complex conversation/direction with extra time/assistive device  Expression Expression assist level: Expresses complex 90% of the time/cues < 10% of the time  Social Interaction Social Interaction assist level: Interacts appropriately 90% of the time - Needs monitoring or encouragement for participation or interaction.  Problem Solving Problem solving assist level: Solves basic problems with no assist  Memory Memory assist level: Recognizes or recalls 90% of the time/requires cueing < 10% of the time   Medical Problem List and Plan: 1.  Mobility and functional deficits secondary to polytrauma as above  -continue CIR therapies 2.  DVT Prophylaxis/Anticoagulation: Pharmaceutical: Lovenox  Dopplers neg for DVT on 9/3 3. Pain Management: Oxycodone prn effective.  4. Mood: LCSW to follow for evaluation and support.  5. Neuropsych: This patient is capable of making decisions on his own behalf. 6. Skin/Wound Care: Monitor incisions for healing.  7. Fluids/Electrolytes/Nutrition: Monitor I/O. Encourage po.   Now back to regular consistency diet 8. L3 endplate fracture: Back precautions --don LSO when patient supine 9. ABLA:   Hb 8.4 on 9/7  Cont to monitor  10. Fevers: Resolved  Encourage IS  -UA negative, UCX negative 11.   Urinary retention:  Likely due to pelvic hematoma.   -continue flomax  -Urecholine 10 TID started on 9/5, increased to 25 on 9/6, increased to 50 on 9/7  -foley d/ced 9/4 12. T2DM: Monitor BS ac/hs.   On metformin, tradjenta  Started Amaryl 1mg  on 9/4, increased 2mg  on 9/6, increased to 4mg  on 9/10  Improving  Use SSI for elevated BS.  13. HTN: Monitor BP   Continue avapro, norvasc and HCTZ.   Fairly controlled at present  Monitor with increased activity 14. Constipation:  Improved  miralax bid, increased on 9/5  constipation on KUB 15. Hyponatremia  Na 133 on 9/7 (stable)  Cont to monitor 16. Scrotal edema  Started lasix 20mg  daily on 9/8  Improving 17. Morbid obesity  Body mass index is 35.58 kg/m.  Diet and exercise education  Encourage weight loss to increase endurance and promote overall health  LOS (Days) 9 A FACE TO FACE EVALUATION WAS PERFORMED  Ankit Lorie Phenix 06/08/2016 9:22 AM

## 2016-06-09 ENCOUNTER — Inpatient Hospital Stay (HOSPITAL_COMMUNITY): Payer: Self-pay | Admitting: Occupational Therapy

## 2016-06-09 ENCOUNTER — Inpatient Hospital Stay (HOSPITAL_COMMUNITY): Payer: BLUE CROSS/BLUE SHIELD | Admitting: Physical Therapy

## 2016-06-09 LAB — CBC WITH DIFFERENTIAL/PLATELET
Basophils Absolute: 0 10*3/uL (ref 0.0–0.1)
Basophils Relative: 0 %
Eosinophils Absolute: 0.5 10*3/uL (ref 0.0–0.7)
Eosinophils Relative: 5 %
HEMATOCRIT: 34 % — AB (ref 39.0–52.0)
HEMOGLOBIN: 10.8 g/dL — AB (ref 13.0–17.0)
LYMPHS ABS: 1.4 10*3/uL (ref 0.7–4.0)
LYMPHS PCT: 15 %
MCH: 28.8 pg (ref 26.0–34.0)
MCHC: 31.8 g/dL (ref 30.0–36.0)
MCV: 90.7 fL (ref 78.0–100.0)
MONOS PCT: 5 %
Monocytes Absolute: 0.5 10*3/uL (ref 0.1–1.0)
NEUTROS ABS: 7 10*3/uL (ref 1.7–7.7)
NEUTROS PCT: 75 %
Platelets: 491 10*3/uL — ABNORMAL HIGH (ref 150–400)
RBC: 3.75 MIL/uL — AB (ref 4.22–5.81)
RDW: 14.2 % (ref 11.5–15.5)
WBC: 9.3 10*3/uL (ref 4.0–10.5)

## 2016-06-09 LAB — BASIC METABOLIC PANEL
Anion gap: 13 (ref 5–15)
BUN: 23 mg/dL — ABNORMAL HIGH (ref 6–20)
CHLORIDE: 97 mmol/L — AB (ref 101–111)
CO2: 23 mmol/L (ref 22–32)
CREATININE: 1.07 mg/dL (ref 0.61–1.24)
Calcium: 9.8 mg/dL (ref 8.9–10.3)
GFR calc non Af Amer: >60 mL/min (ref >60)
Glucose, Bld: 133 mg/dL — ABNORMAL HIGH (ref 65–99)
POTASSIUM: 4.5 mmol/L (ref 3.5–5.1)
SODIUM: 133 mmol/L — AB (ref 135–145)

## 2016-06-09 LAB — GLUCOSE, CAPILLARY
GLUCOSE-CAPILLARY: 102 mg/dL — AB (ref 65–99)
GLUCOSE-CAPILLARY: 170 mg/dL — AB (ref 65–99)
Glucose-Capillary: 92 mg/dL (ref 65–99)
Glucose-Capillary: 135 mg/dL — ABNORMAL HIGH (ref 65–99)
Glucose-Capillary: 160 mg/dL — ABNORMAL HIGH (ref 65–99)

## 2016-06-09 NOTE — Progress Notes (Signed)
Physical Therapy Session Note  Patient Details  Name: Corey Sellers MRN: [id number] Date of Birth: [birth year]  Today's Date: 06/09/2016 PT Individual Time: 1105-1200 PT Individual Time Calculation (min): 55 min    Short Term Goals: Week 2:  PT Short Term Goal 1 (Week 2): =LTGs due to ELOS  Skilled Therapeutic Interventions/Progress Updates:    Pt received resting in bed, no c/o pain, and agreeable to therapy session.  Rolling in bed mod I with PT donning TLSO total assist.  Supine>sit mod I with bed rails, sit>stand>w/c with supervision overall, min verbal cues for maneuvering RW but pt able to maintain NWB throughout transfer.  Pt propelled w/c to and from therapy gym mod I.  PT and pt discussed w/c bumping and practiced with +1 total assist until pt's wife arrived for family training.  PT instructed pt's wife, [name], in w/c bumping with +2 assist and +1 assist to ascend/descend stairs.  [name] able to demonstrate +2 assist with PT behind pt for bumping and [name] in front to ascend, and +1 assist to descent with PT providing close supervision and mod verbal cues.  Discussed with pt and [name] having their daughter, April, present to provide assist if needed and also calling pt's friends from [employer] to assist with w/c bumping if needed.  Both verbalized understanding.  Pt returned to room at end of session and positioned in w/c with call bell in reach and needs met.   Therapy Documentation Precautions:  Precautions Precautions: Back, Fall Precaution Booklet Issued: No Precaution Comments: Patient unable to recall back precautions.  Required Braces or Orthoses: Spinal Brace Spinal Brace: Thoracolumbosacral orthotic, Applied in supine position Restrictions Weight Bearing Restrictions: Yes RLE Weight Bearing: Non weight bearing LLE Weight Bearing: Weight bearing as tolerated Other Position/Activity Restrictions: Pt could maintain NWB on RLE with 2 person assist   See Function  Navigator for Current Functional Status.   Therapy/Group: Individual Therapy  Earnest Conroy Penven-Crew 06/09/2016, 12:06 PM

## 2016-06-09 NOTE — Progress Notes (Signed)
Occupational Therapy Session Note  Patient Details  Name: Corey Sellers MRN: [id number] Date of Birth: 27-May-1951  Today's Date: 06/09/2016 OT Individual Time: 0902-1001 OT Individual Time Calculation (min): 59 min     Short Term Goals: Week 2:  OT Short Term Goal 1 (Week 2): STGs=LTGs due to ELOS  Skilled Therapeutic Interventions/Progress Updates:   Pt participated in skilled OT tx focusing on activity tolerance, adherence to precautions, and standing endurance during ADL retraining. Pt was agreeable to complete ADLs today at bedlevel and EOB as needed per ADL setup at home. Pt will have hospital bed and bariatric BSC at time of discharge. UB self care completed bedlevel with brace donned in supine. LB self care completed at EOB with pt able to lift pants over hips with lateral leaning technique while maintaining NWB on R LE. Pt self propelled to dayroom to prepare coffee while in standing. Pt able to stand for approximately 2 minutes to prepare coffee and clean up. Pt completed with supervision without WB on R LE. Pt propelled back to room and completed oral care w/c level. Pt was left in w/c with all needs within reach in care of nursing at time of departure.   2nd Session 1:1 Tx  Pt participated in skilled OT session focusing on activity tolerance, IADL retraining, and lateral weight shifting while adhering to precautions. Pt self propelled to therapy kitchen and was provided education on w/c mobility during meal prep. Simulated simple meal prep tasks completed with supervision and problem solving regarding pts home setup. Education provided on environmental modifications as well as use of reacher for retrieving items from cupboards. Sweeping floor and cleaning countertops completed with supervision for instruction on w/c safety. Afterwards pt completed leisure based lateral weight shifting activity at [facility] in order to improve abilities during toileting. Pt was provided education on DME needs  for discharge with SW notified regarding bariatric drop arm commode. Pt was assisted with finding outside vendor for half lap tray to transport kitchen items. At end of session pt was left in w/c with all needs within reach.   Therapy Documentation Precautions:  Precautions Precautions: Back, Fall Precaution Booklet Issued: No Precaution Comments: Patient unable to recall back precautions.  Required Braces or Orthoses: Spinal Brace Spinal Brace: Thoracolumbosacral orthotic, Applied in supine position Restrictions Weight Bearing Restrictions: Yes RLE Weight Bearing: Non weight bearing LLE Weight Bearing: Weight bearing as tolerated Other Position/Activity Restrictions: Pt could maintain NWB on RLE with 2 person assist   Vital Signs: Therapy Vitals BP: 126/72 Pain: No c/o pain during sessions    ADL: ADL ADL Comments: see Functional Assessment     See Function Navigator for Current Functional Status.   Therapy/Group: Individual Therapy  Roxan Yamamoto A [place] Youngblood 06/09/2016, 12:48 PM

## 2016-06-09 NOTE — Progress Notes (Signed)
Nutrition Follow-up  DOCUMENTATION CODES:   Obesity unspecified  INTERVENTION:  Discontinue Glucerna Shake.   Encourage adequate PO intake.   NUTRITION DIAGNOSIS:   Increased nutrient needs related to acute illness as evidenced by estimated needs; ongoing  GOAL:   Patient will meet greater than or equal to 90% of their needs; met  MONITOR:   PO intake, Supplement acceptance, Labs, Weight trends, Skin, I & O's  REASON FOR ASSESSMENT:   Consult Diet education  ASSESSMENT:   65 y.o. white male with history of T2DM, HTN, melanoma who sustained a fall off 8 feet off a scaffolding (working on [employer]) on 05/24/2016 onto his back and had immediate onset of back and pelvic pain with inability to walk. 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  Meal completion has been 90-100%. Pt reports having a good appetite with no other difficulties. Pt currently has Glucerna Shake ordered and has been refusing them recently. Pt reports the Glucerna Shakes have been causing abdominal pains. RD to discontinue orders. Pt encouraged to eat his food at meals. Pt reports no further questions regarding his diet once discharged home and expressed understanding of his diabetic diet. Pt additionally reports he is planning on consuming more frozen prepared meals once discharged home. Discussed healthy brands of frozen meals with pt.   Labs and medications reviewed.   Diet Order:  Diet Carb Modified Fluid consistency: Thin; Room service appropriate? Yes  Skin:   (Incision on abdomen and R hip)  Last BM:  9/10  Height:   Ht Readings from Last 1 Encounters:  05/30/16 5' 9"  (1.753 m)    Weight:   Wt Readings from Last 1 Encounters:  06/03/16 240 lb 15.4 oz (109.3 kg)    Ideal Body Weight:  72.7 kg  BMI:  Body mass index  is 35.58 kg/m.  Estimated Nutritional Needs:   Kcal:  2000-2200  Protein:  105-115 grams  Fluid:  2- 2.2 L/day  EDUCATION NEEDS:   Education needs addressed  Corrin Parker, MS, RD, LDN Pager # [phone] After hours/ weekend pager # [phone]

## 2016-06-09 NOTE — Progress Notes (Signed)
[place] PHYSICAL MEDICINE & REHABILITATION     PROGRESS NOTE    Subjective/Complaints: Pt seen laying in bed.  He states he is doing well and gradually improving.   ROS:  Denies CP, SOB, N/V/D.   Objective: Vital Signs: Blood pressure 135/69, pulse 97, temperature 98.9 F (37.2 C), temperature source Oral, resp. rate 18, height 5\' 9"  (1.753 m), weight 109.3 kg (240 lb 15.4 oz), SpO2 99 %. No results found. No results for input(s): WBC, HGB, HCT, PLT in the last 72 hours. No results for input(s): NA, K, CL, GLUCOSE, BUN, CREATININE, CALCIUM in the last 72 hours.  Invalid input(s): CO CBG (last 3)   Recent Labs  06/08/16 1139 06/08/16 2048 06/09/16 0616  GLUCAP 162* 142* 92    Wt Readings from Last 3 Encounters:  06/03/16 109.3 kg (240 lb 15.4 oz)  05/24/16 112 kg (247 lb)  12/28/13 106.1 kg (234 lb)    Physical Exam:  Constitutional: He appears well-developed and well-nourished.  NAD.  HENT: Normocephalic and atraumatic.  Eyes: Conjunctivae and EOM are normal.  Cardiovascular: Normal rate and regular rhythm.  Exam reveals no gallop and no friction rub.  No murmur heard. Respiratory: Effort normal and breath sounds normal. No stridor. No respiratory distress. He has no wheezes.  GI: Bowel sounds are present. There is no tenderness.  Musculoskeletal: He exhibits scrotal edema (improving). He exhibits no tenderness.  Neurological: He is alert and oriented.  Speech clear.  Able to follow basic commands without difficulty.  UE strength grossly 5/5.  RLE: 4+/5HF , 5/5 KE and 5/5 ADF/PF.  LLE: 4+/5HF , 5/5 KE and 5/5 ADF/PF.  No sensory changes.  Skin: Skin is warm and dry.  Hypogastric incision intact with sutures, c/d/i. Right hip incision with suture c/d/i. Psychiatric: He has a flat mood and affect (improving). His behavior is normal. Thought content normal.    Assessment/Plan: 1. Mobility and functional deficits secondary to polytrauma which require 3+  hours per day of interdisciplinary therapy in a comprehensive inpatient rehab setting. Physiatrist is providing close team supervision and 24 hour management of active medical problems listed below. Physiatrist and rehab team continue to assess barriers to discharge/monitor patient progress toward functional and medical goals.  Function:  Bathing Bathing position   Position: Bed  Bathing parts Body parts bathed by patient: Right arm, Left arm, Front perineal area, Right upper leg, Left upper leg, Right lower leg, Left lower leg, Back, Buttocks, Abdomen, Chest Body parts bathed by helper: Buttocks  Bathing assist Assist Level: Supervision or verbal cues      Upper Body Dressing/Undressing Upper body dressing   What is the patient wearing?: Pull over shirt/dress, Orthosis     Pull over shirt/dress - Perfomed by patient: Thread/unthread right sleeve, Thread/unthread left sleeve, Put head through opening, Pull shirt over trunk Pull over shirt/dress - Perfomed by helper: Pull shirt over trunk     Orthosis activity level: Performed by helper  Upper body assist Assist Level: Supervision or verbal cues      Lower Body Dressing/Undressing Lower body dressing   What is the patient wearing?: Pants, Non-skid slipper socks     Pants- Performed by patient: Thread/unthread right pants leg, Thread/unthread left pants leg, Pull pants up/down Pants- Performed by helper: Thread/unthread right pants leg, Thread/unthread left pants leg, Pull pants up/down Non-skid slipper socks- Performed by patient: Don/doff right sock, Don/doff left sock Non-skid slipper socks- Performed by helper: Don/doff right sock, Don/doff left sock  Lower body assist Assist for lower body dressing: Supervision or verbal cues Assistive Device Comment: sock aid    Toileting Toileting Toileting activity did not occur: Refused   Toileting steps completed by helper: Adjust clothing prior to toileting,  Performs perineal hygiene, Adjust clothing after toileting    Toileting assist Assist level:  (total A)   Transfers Chair/bed transfer   Chair/bed transfer method: Stand pivot Chair/bed transfer assist level: Touching or steadying assistance (Pt > 75%) Chair/bed transfer assistive device: Armrests, Medical sales representative Ambulation activity did not occur: Safety/medical concerns         Wheelchair   Type: Manual Max wheelchair distance: 150 Assist Level: Supervision or verbal cues  Cognition Comprehension Comprehension assist level: Follows complex conversation/direction with extra time/assistive device  Expression Expression assist level: Expresses complex 90% of the time/cues < 10% of the time  Social Interaction Social Interaction assist level: Interacts appropriately 90% of the time - Needs monitoring or encouragement for participation or interaction.  Problem Solving Problem solving assist level: Solves basic problems with no assist  Memory Memory assist level: Recognizes or recalls 90% of the time/requires cueing < 10% of the time   Medical Problem List and Plan: 1.  Mobility and functional deficits secondary to polytrauma as above  -continue CIR therapies 2.  DVT Prophylaxis/Anticoagulation: Pharmaceutical: Lovenox  Dopplers neg for DVT on 9/3 3. Pain Management: Oxycodone prn effective.  4. Mood: LCSW to follow for evaluation and support.  5. Neuropsych: This patient is capable of making decisions on his own behalf. 6. Skin/Wound Care: Monitor incisions for healing.  7. Fluids/Electrolytes/Nutrition: Monitor I/O. Encourage po.   Now back to regular consistency diet 8. L3 endplate fracture: Back precautions --don LSO when patient supine 9. ABLA:   Hb 8.4 on 9/7   Labs pending 10. Fevers: Resolved  Encourage IS  -UA negative, UCX negative 11.  Urinary retention:  Likely due to pelvic hematoma.   -continue flomax  -Urecholine 10 TID started on 9/5,  increased to 25 on 9/6, increased to 50 on 9/7  -foley d/ced 9/4 12. T2DM: Monitor BS ac/hs.   On metformin, tradjenta  Started Amaryl 1mg  on 9/4, increased 2mg  on 9/6, increased to 4mg  on 9/10  Improving  Use SSI for elevated BS.  13. HTN: Monitor BP   Continue avapro, norvasc and HCTZ.   Fairly controlled at present  Monitor with increased activity 14. Constipation:  Improved  miralax bid, increased on 9/5  constipation on KUB 15. Hyponatremia  Na 133 on 9/7 (stable)  Cont to monitor  Labs pending 16. Scrotal edema  Started lasix 20mg  daily on 9/8  Improving 17. Morbid obesity  Body mass index is 35.58 kg/m.  Diet and exercise education  Encourage weight loss to increase endurance and promote overall health  LOS (Days) 10 A FACE TO FACE EVALUATION WAS PERFORMED  Ankit Lorie Phenix 06/09/2016 9:11 AM

## 2016-06-10 ENCOUNTER — Inpatient Hospital Stay (HOSPITAL_COMMUNITY): Payer: Self-pay | Admitting: Occupational Therapy

## 2016-06-10 ENCOUNTER — Inpatient Hospital Stay (HOSPITAL_COMMUNITY): Payer: Self-pay | Admitting: Physical Therapy

## 2016-06-10 ENCOUNTER — Inpatient Hospital Stay (HOSPITAL_COMMUNITY): Payer: BLUE CROSS/BLUE SHIELD | Admitting: Physical Therapy

## 2016-06-10 ENCOUNTER — Inpatient Hospital Stay (HOSPITAL_COMMUNITY): Payer: BLUE CROSS/BLUE SHIELD | Admitting: Occupational Therapy

## 2016-06-10 LAB — GLUCOSE, CAPILLARY
GLUCOSE-CAPILLARY: 127 mg/dL — AB (ref 65–99)
GLUCOSE-CAPILLARY: 91 mg/dL (ref 65–99)
GLUCOSE-CAPILLARY: 142 mg/dL — AB (ref 65–99)
GLUCOSE-CAPILLARY: 104 mg/dL — AB (ref 65–99)

## 2016-06-10 NOTE — Progress Notes (Signed)
Physical Therapy Session Note  Patient Details  Name: Corey Sellers MRN: [id number] Date of Birth: Apr 30, 1951  Today's Date: 06/10/2016 PT Individual Time: 0800-0850 PT Individual Time Calculation (min): 50 min    Short Term Goals: Week 2:  PT Short Term Goal 1 (Week 2): =LTGs due to ELOS  Skilled Therapeutic Interventions/Progress Updates:   Pt received semi reclined in bed, denies pain and agreeable to treatment. Pt performed dressing while supine in bed using grabber to thread shorts, and rolling R/L with modI to pull up pants. Upper body dressing performed with modI while supine in bed. TLSO donned maxA with rolling R/L. Supine>sit modI using bed features. Dons B socks with sock aide and increased time. Stand pivot transfer with RW and S to w/c. Seated in w/c at sink, pt performs grooming with modI. W/c propulsion 2x150' with BUE for strengthening and endurance. Pt navigated through family room to make himself coffee, minA when pt dropped k-cup on the ground and didn't have reacher with him. Stand pivot transfer w/c>BSC over toilet with grab bars and min guard while balancing on LLE to pull down/up pants; maintained RLE weight bearing precautions without cues. Required assist for peri hygiene after unsuccessful attempt using toilet aid. Transferred w/c >bed with RW and S. Sit >supine modI with bedrails. TLSO removed with totalA while pt rolling R/L. Pt remained supine in bed with NT present, all needs in reach.   Therapy Documentation Precautions:  Precautions Precautions: Back, Fall Precaution Booklet Issued: No Precaution Comments: Patient unable to recall back precautions.  Required Braces or Orthoses: Spinal Brace Spinal Brace: Thoracolumbosacral orthotic, Applied in supine position Restrictions Weight Bearing Restrictions: Yes RLE Weight Bearing: Non weight bearing LLE Weight Bearing: Weight bearing as tolerated Other Position/Activity Restrictions: Pt could maintain NWB on RLE  with 2 person assist General: PT Amount of Missed Time (min): 10 Minutes PT Missed Treatment Reason: Nursing care   See Function Navigator for Current Functional Status.   Therapy/Group: Individual Therapy  Luberta Mutter 06/10/2016, 8:54 AM

## 2016-06-10 NOTE — Progress Notes (Signed)
Occupational Therapy Session Note  Patient Details  Name: Corey Sellers MRN: [id number] Date of Birth: May 17, 1951  Today's Date: 06/10/2016 OT Individual Time: 1100-1159 OT Individual Time Calculation (min): 59 min     Short Term Goals: Week 2:  OT Short Term Goal 1 (Week 2): STGs=LTGs due to ELOS  Skilled Therapeutic Interventions/Progress Updates:    Upon entering the room, pt supine in bed with no c/o pain. Pt rolling L <> R without assistance in order for therapist to assist pt with donning of TLSO in supine. Pt utilizing long handled reacher to don elastic waist pants from supine position in bed with min verbal cues for technique. Pt performing supine >sit with mod I to EOB. Pt utilizing sock aid to don B socks with set up A to obtain materials. Pt performed stand pivot transfer from bed >wheelchair with RW and supervision for balance. Pt performed grooming at sink at mod I level while seated in wheelchair. Pt propelled wheelchair 200' for endurance and B UE strengthening up slightly sloped surface. Pt needing rest break secondary to fatigue and at which pt OT continued education regarding pt progress towards goals and discharge recommendations. Pt propelled wheelchair back to room and returned to bed in same manner as stated above. Call bell and all needed items within reach upon exiting the room.   Therapy Documentation Precautions:  Precautions Precautions: Back, Fall Precaution Booklet Issued: No Precaution Comments: Patient unable to recall back precautions.  Required Braces or Orthoses: Spinal Brace Spinal Brace: Thoracolumbosacral orthotic, Applied in supine position Restrictions Weight Bearing Restrictions: Yes RLE Weight Bearing: Non weight bearing LLE Weight Bearing: Weight bearing as tolerated Other Position/Activity Restrictions: Pt could maintain NWB on RLE with 2 person assist General: General PT Missed Treatment Reason: Nursing care ADL: ADL ADL Comments: see  Functional Assessment Exercises:   Other Treatments:    See Function Navigator for Current Functional Status.   Therapy/Group: Individual Therapy  Phineas Semen 06/10/2016, 12:45 PM

## 2016-06-10 NOTE — Progress Notes (Signed)
[place] PHYSICAL MEDICINE & REHABILITATION     PROGRESS NOTE    Subjective/Complaints: Pt laying in bed this AM.  He states he is doing well and it's "just another day".   ROS:  Denies CP, SOB, N/V/D.   Objective: Vital Signs: Blood pressure 127/65, pulse 95, temperature 98.3 F (36.8 C), temperature source Oral, resp. rate 19, height 5\' 9"  (1.753 m), weight 109.3 kg (240 lb 15.4 oz), SpO2 97 %. No results found.  Recent Labs  06/09/16 1203  WBC 9.3  HGB 10.8*  HCT 34.0*  PLT 491*    Recent Labs  06/09/16 1203  NA 133*  K 4.5  CL 97*  GLUCOSE 133*  BUN 23*  CREATININE 1.07  CALCIUM 9.8   CBG (last 3)   Recent Labs  06/09/16 1647 06/09/16 2030 06/10/16 0722  GLUCAP 160* 170* 127*    Wt Readings from Last 3 Encounters:  06/03/16 109.3 kg (240 lb 15.4 oz)  05/24/16 112 kg (247 lb)  12/28/13 106.1 kg (234 lb)    Physical Exam:  Constitutional: He appears well-developed and well-nourished.  NAD.  HENT: Normocephalic and atraumatic.  Eyes: Conjunctivae and EOM are normal.  Cardiovascular: Normal rate and regular rhythm.  Exam reveals no gallop and no friction rub.  No murmur heard. Respiratory: Effort normal and breath sounds normal. No stridor. No respiratory distress. He has no wheezes.  GI: Bowel sounds are present. There is no tenderness.  Musculoskeletal: He exhibits scrotal edema (improving). He exhibits no tenderness.  Neurological: He is alert and oriented.  Speech clear.  Able to follow basic commands without difficulty.  UE strength grossly 5/5.  RLE: 4+/5HF , 5/5 KE and 5/5 ADF/PF.  LLE: 4+/5HF , 5/5 KE and 5/5 ADF/PF.  No sensory changes.  Skin: Skin is warm and dry.  Hypogastric incision intact with sutures, c/d/i. Right hip incision with suture c/d/i. Psychiatric: He has a flat mood and affect (improving). His behavior is normal. Thought content normal.    Assessment/Plan: 1. Mobility and functional deficits secondary to  polytrauma which require 3+ hours per day of interdisciplinary therapy in a comprehensive inpatient rehab setting. Physiatrist is providing close team supervision and 24 hour management of active medical problems listed below. Physiatrist and rehab team continue to assess barriers to discharge/monitor patient progress toward functional and medical goals.  Function:  Bathing Bathing position   Position: Other (comment) (bedlevel and at EOB)  Bathing parts Body parts bathed by patient: Right arm, Left arm, Front perineal area, Right upper leg, Left upper leg, Right lower leg, Left lower leg, Back, Buttocks, Abdomen, Chest Body parts bathed by helper: Buttocks  Bathing assist Assist Level: Supervision or verbal cues      Upper Body Dressing/Undressing Upper body dressing   What is the patient wearing?: Pull over shirt/dress, Orthosis     Pull over shirt/dress - Perfomed by patient: Thread/unthread right sleeve, Thread/unthread left sleeve, Put head through opening, Pull shirt over trunk Pull over shirt/dress - Perfomed by helper: Pull shirt over trunk     Orthosis activity level: Performed by helper  Upper body assist Assist Level: Supervision or verbal cues      Lower Body Dressing/Undressing Lower body dressing   What is the patient wearing?: Pants, Non-skid slipper socks     Pants- Performed by patient: Thread/unthread right pants leg, Thread/unthread left pants leg, Pull pants up/down Pants- Performed by helper: Thread/unthread right pants leg, Thread/unthread left pants leg, Pull pants up/down Non-skid slipper  socks- Performed by patient: Don/doff right sock, Don/doff left sock Non-skid slipper socks- Performed by helper: Don/doff right sock, Don/doff left sock                  Lower body assist Assist for lower body dressing: Supervision or verbal cues Assistive Device Comment: sock aid    Toileting Toileting Toileting activity did not occur: Refused   Toileting  steps completed by helper: Adjust clothing prior to toileting, Performs perineal hygiene, Adjust clothing after toileting    Toileting assist Assist level:  (total A)   Transfers Chair/bed transfer   Chair/bed transfer method: Stand pivot Chair/bed transfer assist level: Supervision or verbal cues Chair/bed transfer assistive device: Medical sales representative Ambulation activity did not occur: Safety/medical concerns         Wheelchair   Type: Manual Max wheelchair distance: 150 Assist Level: Supervision or verbal cues  Cognition Comprehension Comprehension assist level: Follows complex conversation/direction with no assist  Expression Expression assist level: Expresses complex ideas: With no assist  Social Interaction Social Interaction assist level: Interacts appropriately with others - No medications needed.  Problem Solving Problem solving assist level: Solves complex problems: With extra time  Memory Memory assist level: More than reasonable amount of time   Medical Problem List and Plan: 1.  Mobility and functional deficits secondary to polytrauma as above  -continue CIR therapies  -Will d/c sutures 2.  DVT Prophylaxis/Anticoagulation: Pharmaceutical: Lovenox  Dopplers neg for DVT on 9/3 3. Pain Management: Oxycodone prn effective.  4. Mood: LCSW to follow for evaluation and support.  5. Neuropsych: This patient is capable of making decisions on his own behalf. 6. Skin/Wound Care: Monitor incisions for healing.  7. Fluids/Electrolytes/Nutrition: Monitor I/O. Encourage po.   Now back to regular consistency diet  Cont encourage fluids 8. L3 endplate fracture: Back precautions --don LSO when patient supine 9. ABLA:   Hb 10.8 on 9/11 (improving) 10. Fevers: Resolved  Encourage IS  -UA negative, UCX negative 11.  Urinary retention:  Likely due to pelvic hematoma.   -continue flomax  -Urecholine 10 TID started on 9/5, increased to 25 on 9/6, increased to 50 on  9/7  -foley d/ced 9/4 12. T2DM: Monitor BS ac/hs.   On metformin, tradjenta  Started Amaryl 1mg  on 9/4, increased 2mg  on 9/6, increased to 4mg  on 9/10  Will cont to monitor 13. HTN: Monitor BP   Continue avapro, norvasc and HCTZ.   Fairly controlled at present  Monitor with increased activity 14. Constipation:  Improved  miralax bid, increased on 9/5  constipation on KUB 15. Hyponatremia  Na 133 on 9/11 (stable)  Cont to monitor  Labs pending 16. Scrotal edema  Started lasix 20mg  daily on 9/8  Improving 17. Morbid obesity  Body mass index is 35.58 kg/m.  Diet and exercise education  Encourage weight loss to increase endurance and promote overall health  LOS (Days) 11 A FACE TO FACE EVALUATION WAS PERFORMED  Ankit Lorie Phenix 06/10/2016 8:53 AM

## 2016-06-10 NOTE — Progress Notes (Signed)
Pt returned demonstrated self catherization with nurse at 1400, resulting in 650 ml of yellow, straw, color urine. Pt very happy with his first attempt. Verbalized 'it was easier than what I thought."  It is the goal for pt to continue to self I&O cath throughout hospitalization with staff's assistance.

## 2016-06-10 NOTE — Progress Notes (Signed)
Occupational Therapy Session Note  Patient Details  Name: REDGE HARTT MRN: [id number] Date of Birth: Jul 21, 1951  Today's Date: 06/10/2016 OT Individual Time: 1330-1415 OT Individual Time Calculation (min): 45 min     Short Term Goals: Week 2:  OT Short Term Goal 1 (Week 2): STGs=LTGs due to ELOS  Skilled Therapeutic Interventions/Progress Updates:   1:1 Pt in bed without TLSO on when arrived. Pt donned shorts with reacher in supine with rolling to pull up pants with setup.  Total A to don TLSO in supine. Came to EOB with supervision. Continued practice with toileting hygiene with toilet aide with practice on functional reach and problem solving ways to setup aide. Pt then transferred with supervision (to lock brakes and maintain WB status) with RW bed<w/c<BSC over commode.  Pt had a BM and practiced hygiene with new method to "decr mess." Also discussed wet wipes to assist with thoroughness.  Pt reported he is not going to wear pants when he gets home. Discussed with PT about redness on bottom and type of cushion to wick away moisture. Discussed his desire to self cath and discussed options for positioning. Decided to try from bed with HOB elevated and TLSO donned. Also introduced self cathing mirror to help guide him.  Nurse coming into to teach self cathing. Left in bed with TLSO donned.    Therapy Documentation Precautions:  Precautions Precautions: Back, Fall Precaution Booklet Issued: No Precaution Comments: Patient unable to recall back precautions.  Required Braces or Orthoses: Spinal Brace Spinal Brace: Thoracolumbosacral orthotic, Applied in supine position Restrictions Weight Bearing Restrictions: Yes RLE Weight Bearing: Non weight bearing LLE Weight Bearing: Weight bearing as tolerated Other Position/Activity Restrictions: Pt could maintain NWB on RLE with 2 person assist Pain:  no c/o pain in session   See Function Navigator for Current Functional  Status.   Therapy/Group: Individual Therapy  [name] [facility] 06/10/2016, 3:49 PM

## 2016-06-10 NOTE — Progress Notes (Signed)
Physical Therapy Session Note  Patient Details  Name: Corey Sellers MRN: [id number] Date of Birth: Oct 23, 1950  Today's Date: 06/10/2016 PT Individual Time: 1600-1630 PT Individual Time Calculation (min): 30 min    Short Term Goals: Week 2:  PT Short Term Goal 1 (Week 2): =LTGs due to ELOS  Skilled Therapeutic Interventions/Progress Updates:    Pt received resting in bed, no c/o pain and agreeable to therapy session.  Rolling mod I, PT donned TLSO total assist.  Supine>sit mod I with bed features, sit<>stand from EOB with RW mod I.  Stand/pivot to w/c with supervision and verbal cues for safety and balance.  W/C propulsion x150' to therapy gym.  PT instructed pt in HEP for BLE 2x10 reps LAQ, hip flexion, hip abd against level 3 theraband, and isometric hip adduction with ball squeezes.  PT propelled back to room for time management.  Sit<>stand from w/c with supervision as pt attempted to stand without locking w/c, supervision for pivot back to bed.  Pt left sitting EOB with call bell in reach and needs met.   Therapy Documentation Precautions:  Precautions Precautions: Back, Fall Precaution Booklet Issued: No Precaution Comments: Patient unable to recall back precautions.  Required Braces or Orthoses: Spinal Brace Spinal Brace: Thoracolumbosacral orthotic, Applied in supine position Restrictions Weight Bearing Restrictions: Yes RLE Weight Bearing: Non weight bearing LLE Weight Bearing: Weight bearing as tolerated Other Position/Activity Restrictions: Pt could maintain NWB on RLE with 2 person assist  See Function Navigator for Current Functional Status.   Therapy/Group: Individual Therapy  Earnest Conroy Penven-Crew 06/10/2016, 4:56 PM

## 2016-06-11 ENCOUNTER — Inpatient Hospital Stay (HOSPITAL_COMMUNITY): Payer: Self-pay | Admitting: Physical Therapy

## 2016-06-11 ENCOUNTER — Inpatient Hospital Stay (HOSPITAL_COMMUNITY): Payer: BLUE CROSS/BLUE SHIELD | Admitting: Occupational Therapy

## 2016-06-11 LAB — GLUCOSE, CAPILLARY
GLUCOSE-CAPILLARY: 103 mg/dL — AB (ref 65–99)
Glucose-Capillary: 165 mg/dL — ABNORMAL HIGH (ref 65–99)
Glucose-Capillary: 167 mg/dL — ABNORMAL HIGH (ref 65–99)
Glucose-Capillary: 231 mg/dL — ABNORMAL HIGH (ref 65–99)

## 2016-06-11 NOTE — Progress Notes (Signed)
Patient sutures removed, incision intact

## 2016-06-11 NOTE — Progress Notes (Signed)
[facility] PHYSICAL MEDICINE & REHABILITATION     PROGRESS NOTE    Subjective/Complaints: Pt laying in bed.  He feels "good" today.  He states he finally had his sutures removed.   ROS:  Denies CP, SOB, N/V/D.  Objective: Vital Signs: Blood pressure 128/63, pulse (!) 101, temperature 98.9 F (37.2 C), temperature source Oral, resp. rate 18, height 5\' 9"  (1.753 m), weight 109.3 kg (240 lb 15.4 oz), SpO2 97 %. No results found.  Recent Labs  06/09/16 1203  WBC 9.3  HGB 10.8*  HCT 34.0*  PLT 491*    Recent Labs  06/09/16 1203  NA 133*  K 4.5  CL 97*  GLUCOSE 133*  BUN 23*  CREATININE 1.07  CALCIUM 9.8   CBG (last 3)   Recent Labs  06/10/16 1630 06/10/16 2105 06/11/16 0646  GLUCAP 142* 104* 103*    Wt Readings from Last 3 Encounters:  06/03/16 109.3 kg (240 lb 15.4 oz)  05/24/16 112 kg (247 lb)  12/28/13 106.1 kg (234 lb)    Physical Exam:  Constitutional: He appears well-developed and well-nourished.  NAD.  HENT: Normocephalic and atraumatic.  Eyes: Conjunctivae and EOM are normal.  Cardiovascular: Normal rate and regular rhythm.  Exam reveals no gallop and no friction rub.  No murmur heard. Respiratory: Effort normal and breath sounds normal. No stridor. No respiratory distress. He has no wheezes.  GI: Bowel sounds are present. There is no tenderness.  Musculoskeletal: He exhibits scrotal edema (stable). He exhibits no tenderness.  Neurological: He is alert and oriented.  Speech clear.  Able to follow basic commands without difficulty.  UE strength grossly 5/5.  RLE: 4+/5HF , 5/5 KE and 5/5 ADF/PF.  LLE: 4+/5HF , 5/5 KE and 5/5 ADF/PF.  No sensory changes.  Skin: Skin is warm and dry.  Hypogastric incision c/d/i. Right hip incision c/d/i. Psychiatric: He has a flat mood and affect (improving). His behavior is normal. Thought content normal.    Assessment/Plan: 1. Mobility and functional deficits secondary to polytrauma which require 3+ hours  per day of interdisciplinary therapy in a comprehensive inpatient rehab setting. Physiatrist is providing close team supervision and 24 hour management of active medical problems listed below. Physiatrist and rehab team continue to assess barriers to discharge/monitor patient progress toward functional and medical goals.  Function:  Bathing Bathing position   Position: Other (comment) (bedlevel and at EOB)  Bathing parts Body parts bathed by patient: Right arm, Left arm, Front perineal area, Right upper leg, Left upper leg, Right lower leg, Left lower leg, Back, Buttocks, Abdomen, Chest Body parts bathed by helper: Buttocks  Bathing assist Assist Level: Supervision or verbal cues      Upper Body Dressing/Undressing Upper body dressing   What is the patient wearing?: Pull over shirt/dress, Orthosis     Pull over shirt/dress - Perfomed by patient: Thread/unthread right sleeve, Thread/unthread left sleeve, Put head through opening, Pull shirt over trunk Pull over shirt/dress - Perfomed by helper: Pull shirt over trunk     Orthosis activity level: Performed by helper  Upper body assist Assist Level: Supervision or verbal cues      Lower Body Dressing/Undressing Lower body dressing   What is the patient wearing?: Pants, Non-skid slipper socks     Pants- Performed by patient: Thread/unthread right pants leg, Thread/unthread left pants leg, Pull pants up/down Pants- Performed by helper: Thread/unthread right pants leg, Thread/unthread left pants leg, Pull pants up/down Non-skid slipper socks- Performed by patient: Don/doff  right sock, Don/doff left sock Non-skid slipper socks- Performed by helper: Don/doff right sock, Don/doff left sock                  Lower body assist Assist for lower body dressing: Supervision or verbal cues Assistive Device Comment: sock aid and reacher    Toileting Toileting Toileting activity did not occur: Refused Toileting steps completed by  patient: Adjust clothing prior to toileting, Adjust clothing after toileting Toileting steps completed by helper: Performs perineal hygiene Toileting Assistive Devices: Toilet aid, Grab bar or rail  Toileting assist Assist level: Touching or steadying assistance (Pt.75%)   Transfers Chair/bed transfer   Chair/bed transfer method: Stand pivot Chair/bed transfer assist level: Supervision or verbal cues Chair/bed transfer assistive device: Armrests, Medical sales representative Ambulation activity did not occur: Safety/medical concerns         Wheelchair   Type: Manual Max wheelchair distance: 200' Assist Level: Supervision or verbal cues  Cognition Comprehension Comprehension assist level: Follows complex conversation/direction with no assist  Expression Expression assist level: Expresses complex ideas: With no assist  Social Interaction Social Interaction assist level: Interacts appropriately with others - No medications needed.  Problem Solving Problem solving assist level: Solves complex problems: Recognizes & self-corrects  Memory Memory assist level: More than reasonable amount of time   Medical Problem List and Plan: 1.  Mobility and functional deficits secondary to polytrauma as above  -continue CIR therapies 2.  DVT Prophylaxis/Anticoagulation: Pharmaceutical: Lovenox  Dopplers neg for DVT on 9/3 3. Pain Management: Oxycodone prn effective.  4. Mood: LCSW to follow for evaluation and support.  5. Neuropsych: This patient is capable of making decisions on his own behalf. 6. Skin/Wound Care: Monitor incisions for healing.  7. Fluids/Electrolytes/Nutrition: Monitor I/O. Encourage po.   Now back to regular consistency diet  Cont encourage fluids 8. L3 endplate fracture: Back precautions --don LSO when patient supine 9. ABLA:   Hb 10.8 on 9/11 (improving) 10. Fevers: Resolved  Encourage IS  -UA negative, UCX negative 11.  Urinary retention:  Likely due to pelvic  hematoma.   -continue flomax  -Urecholine 10 TID started on 9/5, increased to 25 on 9/6, increased to 50 on 9/7  -foley d/ced 9/4 12. T2DM: Monitor BS ac/hs.   On metformin, tradjenta  Started Amaryl 1mg  on 9/4, increased 2mg  on 9/6, increased to 4mg  on 9/10  Improving  Will cont to monitor 13. HTN: Monitor BP   Continue avapro, norvasc and HCTZ.   Fairly controlled at present  Monitor with increased activity 14. Constipation:  Improved  miralax bid, increased on 9/5  constipation on KUB 15. Hyponatremia  Na 133 on 9/11 (stable)  Cont to monitor  Labs pending 16. Scrotal edema  Started lasix 20mg  daily on 9/8  Improving 17. Morbid obesity  Body mass index is 35.58 kg/m.  Diet and exercise education  Encourage weight loss to increase endurance and promote overall health  LOS (Days) 12 A FACE TO FACE EVALUATION WAS PERFORMED  Ankit Lorie Phenix 06/11/2016 8:42 AM

## 2016-06-11 NOTE — Progress Notes (Signed)
Physical Therapy Session Note  Patient Details  Name: Corey Sellers MRN: [id number] Date of Birth: 1951-07-31  Today's Date: 06/11/2016 PT Individual Time:  1100-1200 Calculated PT individual Time: 60 min    Short Term Goals: Week 1:  PT Short Term Goal 1 (Week 1): Patient will perform bed mobility with supervision A and flat bed.  PT Short Term Goal 1 - Progress (Week 1): Met PT Short Term Goal 2 (Week 1): Patient will transfer to [facility] with stand pivot/squat pivot with supervision A.  PT Short Term Goal 2 - Progress (Week 1): Not met PT Short Term Goal 3 (Week 1): Patient will initiate gait training with LRAD PT Short Term Goal 3 - Progress (Week 1): Discontinued (comment) (pt NWB on RLE and WBAT on LLE for transfers only) PT Short Term Goal 4 (Week 1): Patient will initiate stair training for access into house.  PT Short Term Goal 4 - Progress (Week 1): Discontinued (comment) (Pt NWB on RLE and WBAT on LLE for transfers only) Week 2:  PT Short Term Goal 1 (Week 2): =LTGs due to ELOS  Skilled Therapeutic Interventions/Progress Updates:     PT instructed patient in [facility] mobility in controlled environment x282f, 3021f and 180 with cues or assistance from PT  [facility] instructed by PT in simulated community environment through gift shop for [id number] through food court x [id number] provided supervision A with min cues for awareness of WC size in confined spaces.    PT instructed patient in stand pivot Furniture transfer with mod A form low seat Height on couch to simulate transfer off love seat in home environment. PT provide max multi modal cues for proper UE placement, improved anterior weight shift, and sequencing to prevent breaking back precautions. Patient unsuccessful at first 2 attempts of transfer due to poor weight shifting. Following final successful attempt, patient required prolonged restbreak due to increased low back pain. Paint reduced after 5 minutes of rest.     Patient demonstrated improved functional mobility in home environment to make coffee at [employer] supervision A from PT. PT only provided min cues to maintain back precautions with forward and lateral reaching.   Patient returned to room and performed sit>supiner with heavy use of bed rails. And roll R and L to doff back brace with assistance from PT.   Patient left with call bell in reach and all needs met.   Therapy Documentation Precautions:  Precautions Precautions: Back, Fall Precaution Booklet Issued: No Precaution Comments: Patient unable to recall back precautions.  Required Braces or Orthoses: Spinal Brace Spinal Brace: Thoracolumbosacral orthotic, Applied in supine position Restrictions Weight Bearing Restrictions: Yes RLE Weight Bearing: Non weight bearing LLE Weight Bearing: Weight bearing as tolerated Other Position/Activity Restrictions: Pt could maintain NWB on RLE with 2 person assist General:   Vital Signs: Therapy Vitals Temp: 98.9 F (37.2 C) Temp Source: Oral Pulse Rate: (!) 101 Resp: 18 BP: 128/63 Patient Position (if appropriate): Lying Oxygen Therapy SpO2: 97 % O2 Device: Not Delivered Pain: 0/10     See Function Navigator for Current Functional Status.   Therapy/Group: Individual Therapy  AustLorie Phenix3/2017, 8:02 AM

## 2016-06-11 NOTE — Progress Notes (Signed)
Occupational Therapy Session Note  Patient Details  Name: Corey Sellers MRN: [id number] Date of Birth: November 23, 1950  Today's Date: 06/11/2016 OT Individual Time: 0900-1000 OT Individual Time Calculation (min): 60 min     Short Term Goals: Week 2:  OT Short Term Goal 1 (Week 2): STGs=LTGs due to ELOS  Skilled Therapeutic Interventions/Progress Updates:    Upon entering the room, pt supine in bed awaiting therapist. OT and pt discussing options for home set up for bathing and timing based on assistance at home and energy conservation. Pt performing task this session as discussed for home set up. OT provided set up by assisting pt to obtain materials needed for task. Pt supine in bed bathing UB and LB without breaking back precautions. Pt given min cues to cross legs over in order to watch. Set up A to assist pt with donning of TLSO in supine. Pt rolling L <> R with bed rails and no assistance. Supine >sit with mod I as well. Supervision for safety secondary to balance for pt to transfer from bed >wheelchair. No LB clothing donned as pt reporting need for toileting. Pt transferred with grab bar and min cues for hand placement onto elevated toilet. Pt having BM and wiping self while seated on drop arm commode with toilet aide. Pt returned to wheelchair and back to bed at end of session. Call bell and all needed items within reach upon exiting the room.   Therapy Documentation Precautions:  Precautions Precautions: Back, Fall Precaution Booklet Issued: No Precaution Comments: Patient unable to recall back precautions.  Required Braces or Orthoses: Spinal Brace Spinal Brace: Thoracolumbosacral orthotic, Applied in supine position Restrictions Weight Bearing Restrictions: Yes RLE Weight Bearing: Non weight bearing LLE Weight Bearing: Weight bearing as tolerated Other Position/Activity Restrictions: Pt could maintain NWB on RLE with 2 person assist    ADL: ADL ADL Comments: see Functional  Assessment  See Function Navigator for Current Functional Status.   Therapy/Group: Individual Therapy  Phineas Semen 06/11/2016, 4:30 PM

## 2016-06-11 NOTE — Progress Notes (Signed)
Physical Therapy Session Note  Patient Details  Name: Corey Sellers MRN: [id number] Date of Birth: 31-Mar-1951  Today's Date: 06/11/2016 PT Individual Time: 1515-1630 PT Individual Time Calculation (min): 75 min    Short Term Goals: Week 2:  PT Short Term Goal 1 (Week 2): =LTGs due to ELOS  Skilled Therapeutic Interventions/Progress Updates:    Pt received resting in bed, c/o being sore but does not rate, agreeable to therapy session.  Rolling to L mod I, PT donned TLSO total assist.  L side lying>sit mod I using bed rails from flat bed.  Pt able to don slide on shoes with set up assist.  Sit>stand>pivot to w/c with RW mod I.  Stand/pivot from w/c>therapy mat with verbal cues for management of leg rests.  PT instructed pt in BLE HEP 2x10 reps for LAQ, hip flexion, hip abd with level 3 theraband, hip add with pillow, and glute sets in sitting.  UE therex with 2# dowel rod for ant shoulder flexion, chest presses, and bicep curls.  Stand/pivot back to w/c mod I.  PT instructed pt in w/c obstacle course focus on forward and retropropulsion through obstacles while maintaining spinal precautions.  Pt able to pick up cones from floor with reacher and return them to the gym.  Stand/pivot back to bed at end of session with set up assist.  Pt positioned self in supine, PT removed TLSO total assist.  Call bell in reach and needs met.    Therapy Documentation Precautions:  Precautions Precautions: Back, Fall Precaution Booklet Issued: No Precaution Comments: Patient unable to recall back precautions.  Required Braces or Orthoses: Spinal Brace Spinal Brace: Thoracolumbosacral orthotic, Applied in supine position Restrictions Weight Bearing Restrictions: Yes RLE Weight Bearing: Non weight bearing LLE Weight Bearing: Weight bearing as tolerated Other Position/Activity Restrictions: Pt could maintain NWB on RLE with 2 person assist General: PT Amount of Missed Time (min): 15 Minutes PT Missed Treatment  Reason: Patient fatigue   See Function Navigator for Current Functional Status.   Therapy/Group: Individual Therapy  Earnest Conroy Penven-Crew 06/11/2016, 5:53 PM

## 2016-06-12 ENCOUNTER — Inpatient Hospital Stay (HOSPITAL_COMMUNITY): Payer: Self-pay | Admitting: Physical Therapy

## 2016-06-12 ENCOUNTER — Inpatient Hospital Stay (HOSPITAL_COMMUNITY): Payer: BLUE CROSS/BLUE SHIELD | Admitting: Physical Therapy

## 2016-06-12 ENCOUNTER — Inpatient Hospital Stay (HOSPITAL_COMMUNITY): Payer: BLUE CROSS/BLUE SHIELD | Admitting: Occupational Therapy

## 2016-06-12 DIAGNOSIS — R5082 Postprocedural fever: Secondary | ICD-10-CM

## 2016-06-12 LAB — GLUCOSE, CAPILLARY
Glucose-Capillary: 109 mg/dL — ABNORMAL HIGH (ref 65–99)
Glucose-Capillary: 145 mg/dL — ABNORMAL HIGH (ref 65–99)
Glucose-Capillary: 141 mg/dL — ABNORMAL HIGH (ref 65–99)
Glucose-Capillary: 113 mg/dL — ABNORMAL HIGH (ref 65–99)

## 2016-06-12 MED ORDER — SENNOSIDES-DOCUSATE SODIUM 8.6-50 MG PO TABS: 1.0000 | ORAL_TABLET | Freq: Every day | ORAL | Status: DC

## 2016-06-12 NOTE — Progress Notes (Signed)
Social Work Patient ID: Corey Sellers, male   DOB: 1951-04-27, 65 y.o.   MRN: [id number]  Have reviewed team conference with pt and wife.  Both feeling ready for d/c tomorrow with [place] follow up and DME ordered.  Wife to be in this afternoon/ evening to receive instruction on caths.  Sharonlee Nine, LCSW

## 2016-06-12 NOTE — Progress Notes (Signed)
Occupational Therapy Session Note  Patient Details  Name: Corey Sellers MRN: [id number] Date of Birth: [birth year]  Today's Date: 06/12/2016 OT Individual Time: [id number] OT Individual Time Calculation (min): 31 min     Skilled Therapeutic Interventions/Progress Updates:    Pt donned TLSO in supine rolling to the side with total assist for positioned and strapping.  Once donned he was able to transition to the EOB with modified independent level using the bed rails.  He was able to donn slip on shoes with supervision and transfer to the wheelchair with modified independence using the RW and maintaining NWBing status.  He was able to propel his wheelchair down to the therapy gym for further work on stand pivot transfers using the RW.  Also completed standing with simulation of clothing management for toileting tasks with modified independence.  Pt returned to room at end of session with call button and phone in reach.  No complaints of pain during session.   Therapy Documentation Precautions:  Precautions Precautions: Back, Fall Precaution Booklet Issued: No Precaution Comments: pt able to recall 3/3 back precautions, but does not consistently adhere to them  Required Braces or Orthoses: Spinal Brace Spinal Brace: Thoracolumbosacral orthotic, Applied in supine position Restrictions Weight Bearing Restrictions: Yes RLE Weight Bearing: Weight bearing as tolerated LLE Weight Bearing: Weight bearing as tolerated Other Position/Activity Restrictions: LLE WBAT for transfers only  Pain: Pain Assessment Pain Assessment: No/denies pain ADL: See Function Navigator for Current Functional Status.   Therapy/Group: Individual Therapy  Raysha Tilmon OTR/L 06/12/2016, 4:26 PM

## 2016-06-12 NOTE — Progress Notes (Signed)
Nutrition Follow-up  DOCUMENTATION CODES:   Obesity unspecified  INTERVENTION:  Encourage adequate PO intake.   Diet and weight loss education given.  NUTRITION DIAGNOSIS:   Increased nutrient needs related to acute illness as evidenced by estimated needs; ongoing  GOAL:   Patient will meet greater than or equal to 90% of their needs; met  MONITOR:   PO intake, Supplement acceptance, Labs, Weight trends, Skin, I & O's  REASON FOR ASSESSMENT:   Consult Diet education  ASSESSMENT:   65 y.o. white male with history of T2DM, HTN, melanoma who sustained a fall off 8 feet off a scaffolding (working on [employer]) on 05/24/2016 onto his back and had immediate onset of back and pelvic pain with inability to walk. 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  Meal completion has been 100%. Pt reports having a good appetite with no other difficulties. RD consulted for diet and weight loss education. Education given. Plans for pt to be discharged tomorrow.   Diet Order:  Diet Carb Modified Fluid consistency: Thin; Room service appropriate? Yes  Skin:   (Incision on abdomen and R hip)  Last BM:  9/13  Height:   Ht Readings from Last 1 Encounters:  05/30/16 _0  (1.753 m)    Weight:   Wt Readings from Last 1 Encounters:  06/03/16 240 lb 15.4 oz (109.3 kg)    Ideal Body Weight:  72.7 kg  BMI:  Body mass index is 35.58 kg/m.  Estimated Nutritional Needs:   Kcal:  2000-2200  Protein:  105-115 grams  Fluid:  2- 2.2 L/day  EDUCATION NEEDS:   Education needs addressed  Corrin Parker, MS, RD, LDN Pager # [phone] After hours/ weekend pager # [phone]

## 2016-06-12 NOTE — Progress Notes (Signed)
Physical Therapy Session Note  Patient Details  Name: ELAN OHORA MRN: [id number] Date of Birth: [birth year]  Today's Date: 06/12/2016 PT Individual Time: 1550-1615 PT Individual Time Calculation (min): 25 min    Short Term Goals: Week 2:  PT Short Term Goal 1 (Week 2): =LTGs due to ELOS  Skilled Therapeutic Interventions/Progress Updates:    Session focused on adjusting leg rests for patient's wheelchair and community wheelchair propulsion using BUE throughout hospital, on/off elevators, over thresholds, and on uneven brick and concrete surfaces with mod I. Patient reports feeling ready for discharge tomorrow with no questions/concerns. Patient left sitting in wheelchair with all needs within reach.   Therapy Documentation Precautions:  Precautions Precautions: Back, Fall Precaution Booklet Issued: No Precaution Comments: pt able to recall 3/3 back precautions, but does not consistently adhere to them  Required Braces or Orthoses: Spinal Brace Spinal Brace: Thoracolumbosacral orthotic, Applied in supine position Restrictions Weight Bearing Restrictions: Yes RLE Weight Bearing: Weight bearing as tolerated LLE Weight Bearing: Weight bearing as tolerated Other Position/Activity Restrictions: LLE WBAT for transfers only Pain: Pain Assessment Pain Assessment: No/denies pain   See Function Navigator for Current Functional Status.   Therapy/Group: Individual Therapy  Laretta Alstrom 06/12/2016, 5:17 PM

## 2016-06-12 NOTE — Progress Notes (Signed)
Physical Therapy Session Note  Patient Details  Name: Corey Sellers MRN: [id number] Date of Birth: [birth year]  Today's Date: 06/12/2016 PT Individual Time: 1100-1200 PT Individual Time Calculation (min): 60 min    Short Term Goals: Week 2:  PT Short Term Goal 1 (Week 2): =LTGs due to ELOS  Skilled Therapeutic Interventions/Progress Updates:    Pt received resting in w/c with no c/o pain and agreeable to therapy session.  Session focus on activity tolerance, transfers, and review of HEP.  Pt propels w/c to ortho gym with BUEs for strengthening and endurance.  Car transfer with supervision, verbal cues for management of the w/c leg rests.  Stand/pivot from w/c to therapy mat mod I.  Pt performed HEP with min verbal cues from PT 10 reps of each exercise (LAQ, hip flexion, hip abd, hip add, and glute sets in sitting).  Pt returned to room at end of session, mod I stand/pivot from w/c>bed and sit>supine.  PT removed TLSO total assist.  Pt left semi reclined with cal bell in reach and needs met.   Therapy Documentation Precautions:  Precautions Precautions: Back, Fall Precaution Booklet Issued: No Precaution Comments: pt able to recall 3/3 back precautions, but does not consistently adhere to them  Required Braces or Orthoses: Spinal Brace Spinal Brace: Thoracolumbosacral orthotic, Applied in supine position Restrictions Weight Bearing Restrictions: Yes RLE Weight Bearing: Weight bearing as tolerated LLE Weight Bearing: Weight bearing as tolerated Other Position/Activity Restrictions: LLE WBAT for transfers only   See Function Navigator for Current Functional Status.   Therapy/Group: Individual Therapy  Earnest Conroy Penven-Crew 06/12/2016, 12:48 PM

## 2016-06-12 NOTE — Progress Notes (Signed)
[place] PHYSICAL MEDICINE & REHABILITATION     PROGRESS NOTE    Subjective/Complaints: Pt laying in bed, states he is doing "well". He notes spontaneous void x3 yesterday.   ROS:  Denies CP, SOB, N/V/D.  Objective: Vital Signs: Blood pressure (!) 146/70, pulse 98, temperature 98.7 F (37.1 C), temperature source Oral, resp. rate 18, height 5\' 9"  (1.753 m), weight 109.3 kg (240 lb 15.4 oz), SpO2 96 %. No results found.  Recent Labs  06/09/16 1203  WBC 9.3  HGB 10.8*  HCT 34.0*  PLT 491*    Recent Labs  06/09/16 1203  NA 133*  K 4.5  CL 97*  GLUCOSE 133*  BUN 23*  CREATININE 1.07  CALCIUM 9.8   CBG (last 3)   Recent Labs  06/11/16 1627 06/11/16 2117 06/12/16 0648  GLUCAP 167* 231* 109*    Wt Readings from Last 3 Encounters:  06/03/16 109.3 kg (240 lb 15.4 oz)  05/24/16 112 kg (247 lb)  12/28/13 106.1 kg (234 lb)    Physical Exam:  Constitutional: He appears well-developed and well-nourished.  NAD.  HENT: Normocephalic and atraumatic.  Eyes: Conjunctivae and EOM are normal.  Cardiovascular: Normal rate and regular rhythm.  Exam reveals no gallop and no friction rub.  No murmur heard. Respiratory: Effort normal and breath sounds normal. No stridor. No respiratory distress. He has no wheezes.  GI: Bowel sounds are present. There is no tenderness.  Musculoskeletal: He exhibits scrotal edema (stable). He exhibits no tenderness.  Neurological: He is alert and oriented.  Speech clear.  Able to follow basic commands without difficulty.  UE strength grossly 5/5.  RLE: 5-/5HF , 5/5 KE and 5/5 ADF/PF.  LLE: 5-/5HF , 5/5 KE and 5/5 ADF/PF.  No sensory changes.  Skin: Skin is warm and dry.  Hypogastric incision c/d/i. Right hip incision c/d/i. Psychiatric: He has a flat mood and affect (improving). His behavior is normal. Thought content normal.    Assessment/Plan: 1. Mobility and functional deficits secondary to polytrauma which require 3+ hours per  day of interdisciplinary therapy in a comprehensive inpatient rehab setting. Physiatrist is providing close team supervision and 24 hour management of active medical problems listed below. Physiatrist and rehab team continue to assess barriers to discharge/monitor patient progress toward functional and medical goals.  Function:  Bathing Bathing position   Position: Other (comment) (bed level)  Bathing parts Body parts bathed by patient: Right arm, Left arm, Front perineal area, Right upper leg, Left upper leg, Right lower leg, Left lower leg, Buttocks, Abdomen, Chest Body parts bathed by helper: Buttocks  Bathing assist Assist Level: Set up, Supervision or verbal cues   Set up : To obtain items  Upper Body Dressing/Undressing Upper body dressing   What is the patient wearing?: Pull over shirt/dress, Orthosis     Pull over shirt/dress - Perfomed by patient: Thread/unthread right sleeve, Thread/unthread left sleeve, Put head through opening, Pull shirt over trunk Pull over shirt/dress - Perfomed by helper: Pull shirt over trunk     Orthosis activity level: Performed by helper  Upper body assist Assist Level: Set up   Set up : To apply TLSO, cervical collar, To obtain clothing/put away  Lower Body Dressing/Undressing Lower body dressing   What is the patient wearing?: Non-skid slipper socks     Pants- Performed by patient: Thread/unthread right pants leg, Thread/unthread left pants leg, Pull pants up/down Pants- Performed by helper: Thread/unthread right pants leg, Thread/unthread left pants leg, Pull pants up/down  Non-skid slipper socks- Performed by patient: Don/doff right sock, Don/doff left sock Non-skid slipper socks- Performed by helper: Don/doff right sock, Don/doff left sock                  Lower body assist Assist for lower body dressing: Set up Assistive Device Comment: sock aid    Toileting Toileting Toileting activity did not occur: Refused Toileting steps  completed by patient: Adjust clothing prior to toileting, Adjust clothing after toileting, Performs perineal hygiene Toileting steps completed by helper: Performs perineal hygiene Toileting Assistive Devices: Toilet aid  Toileting assist Assist level: Set up/obtain supplies, Supervision or verbal cues   Transfers Chair/bed transfer   Chair/bed transfer method: Stand pivot Chair/bed transfer assist level: Supervision or verbal cues Chair/bed transfer assistive device: Walker, Armrests     Locomotion Ambulation Ambulation activity did not occur: Safety/medical concerns         Wheelchair   Type: Manual Max wheelchair distance: 200' Assist Level: No help, No cues, assistive device, takes more than reasonable amount of time  Cognition Comprehension Comprehension assist level: Follows complex conversation/direction with no assist  Expression Expression assist level: Expresses complex ideas: With no assist  Social Interaction Social Interaction assist level: Interacts appropriately with others - No medications needed.  Problem Solving Problem solving assist level: Solves complex problems: Recognizes & self-corrects  Memory Memory assist level: More than reasonable amount of time   Medical Problem List and Plan: 1.  Mobility and functional deficits secondary to polytrauma as above  -continue CIR therapies 2.  DVT Prophylaxis/Anticoagulation: Pharmaceutical: Lovenox  Dopplers neg for DVT on 9/3 3. Pain Management: Oxycodone prn effective.  4. Mood: LCSW to follow for evaluation and support.  5. Neuropsych: This patient is capable of making decisions on his own behalf. 6. Skin/Wound Care: Monitor incisions for healing.  7. Fluids/Electrolytes/Nutrition: Monitor I/O. Encourage po.   Now back to regular consistency diet  Cont encourage fluids 8. L3 endplate fracture: Back precautions --don LSO when patient supine 9. ABLA:   Hb 10.8 on 9/11 (improving) 10. Fevers: Resolved  Encourage  IS  -UA negative, UCX negative 11.  Urinary retention:  Improving  Likely due to pelvic hematoma.   -continue flomax  -Urecholine 10 TID started on 9/5, increased to 25 on 9/6, increased to 50 on 9/7  -foley d/ced 9/4 12. T2DM: Monitor BS ac/hs.   On metformin, tradjenta  Started Amaryl 1mg  on 9/4, increased 2mg  on 9/6, increased to 4mg  on 9/10  One spike yesterday, otherwise controlled  Will cont to monitor 13. HTN: Monitor BP   Continue avapro, norvasc and HCTZ.   Fairly controlled at present  Monitor with increased activity 14. Constipation:  Improved  miralax bid, increased on 9/5  constipation on KUB 15. Hyponatremia  Na 133 on 9/11 (stable)  Cont to monitor 16. Scrotal edema  Started lasix 20mg  daily on 9/8  Improving slowly 17. Morbid obesity  Body mass index is 35.58 kg/m.  Diet and exercise education  Encourage weight loss to increase endurance and promote overall health  LOS (Days) 13 A FACE TO FACE EVALUATION WAS PERFORMED  Ariann Khaimov Lorie Phenix 06/12/2016 8:26 AM

## 2016-06-12 NOTE — Progress Notes (Signed)
Occupational Therapy Discharge Summary  Patient Details  Name: Corey Sellers MRN: [id number] Date of Birth: 05/06/1951  Today's Date: 06/12/2016 OT Individual Time:  0900- 0958   58 minutes of skilled OT intervention    Patient has met 9 of 9 long term goals due to improved activity tolerance, improved balance and ability to compensate for deficits.  Patient to discharge at overall mod I - min A level.  Patient's care partner is independent to provide the necessary physical assistance at discharge.    Reasons goals not met: all goals met  Recommendation:  Patient will benefit from ongoing skilled OT services in home health setting to continue to advance functional skills in the area of BADL and iADL.  Equipment: drop arm commode chair  Reasons for discharge: treatment goals met  Patient/family agrees with progress made and goals achieved: Yes    OT Intervention: Upon entering the room, pt supine in bed with no c/o pain. Pt requesting to wash up this session. Pt required set up of materials and pt washing self in supine. Pt utilized long handled sponge to wash B LE's and feet. Pt utilized reacher to thread pants onto B feet. OT assisting pt with donning TLSO in supine. Pt performed supine >sit with mod I. Stand pivot transfer from bed >wheelchair with RW and maintaining precautions. Pt seated in wheelchair to perform all grooming tasks at sink. Pt remained seated in wheelchair with call bell and all needed items within reach.    OT Discharge Precautions/Restrictions Precautions Precautions: Back;Fall Precaution Booklet Issued: No Precaution Comments: pt able to recall 3/3 back precautions, but does not consistently adhere to them  Required Braces or Orthoses: Spinal Brace Spinal Brace: Thoracolumbosacral orthotic;Applied in supine position Restrictions Weight Bearing Restrictions: Yes RLE Weight Bearing: Non weight bearing LLE Weight Bearing: Weight bearing as  tolerated  Pain Pain Assessment Pain Assessment: No/denies pain ADL ADL ADL Comments: see Functional Assessment Vision/Perception  Vision- History Baseline Vision/History: Wears glasses Wears Glasses: At all times Patient Visual Report: No change from baseline Vision- Assessment Vision Assessment?: No apparent visual deficits  Cognition Overall Cognitive Status: Within Functional Limits for tasks assessed Arousal/Alertness: Awake/alert Orientation Level: Oriented X4 Sensation Sensation Light Touch: Appears Intact Stereognosis: Appears Intact Hot/Cold: Appears Intact Proprioception: Appears Intact Coordination Gross Motor Movements are Fluid and Coordinated: Yes Fine Motor Movements are Fluid and Coordinated: Yes Mobility  Bed Mobility Bed Mobility: Rolling Right;Rolling Left;Supine to Sit Rolling Right: 6: Modified independent (Device/Increase time) Rolling Left: 6: Modified independent (Device/Increase time) Sit to Supine: 6: Modified independent (Device/Increase time)  Trunk/Postural Assessment  Cervical Assessment Cervical Assessment: Within Functional Limits Thoracic Assessment Thoracic Assessment: Within Functional Limits Lumbar Assessment Lumbar Assessment: Exceptions to WFL (TLSO with spinal precautions) Postural Control Postural Control: Within Functional Limits  Balance Balance Balance Assessed: Yes Static Sitting Balance Static Sitting - Level of Assistance: 6: Modified independent (Device/Increase time) Dynamic Sitting Balance Dynamic Sitting - Level of Assistance: 6: Modified independent (Device/Increase time) Static Standing Balance Static Standing - Level of Assistance: 6: Modified independent (Device/Increase time) Extremity/Trunk Assessment RUE Assessment RUE Assessment: Within Functional Limits LUE Assessment LUE Assessment: Within Functional Limits   See Function Navigator for Current Functional Status.  [place],  L 06/12/2016, 9:11  PM 

## 2016-06-12 NOTE — Plan of Care (Signed)
Problem: Food- and Nutrition-Related Knowledge Deficit (NB-1.1) Goal: Nutrition education Formal process to instruct or train a patient/client in a skill or to impart knowledge to help patients/clients voluntarily manage or modify food choices and eating behavior to maintain or improve health. Outcome: Completed/Met Date Met: 06/12/16  RD consulted for nutrition education regarding diet and weight loss.  RD provided "Weight Loss Tips" handout from the Academy of [employer] and Dietetics and "My Plate Eating Method" handout. Emphasized the importance of serving sizes and provided examples of correct portions of common foods. Discussed importance of controlled and consistent intake throughout the day. Provided examples of ways to balance meals/snacks and encouraged intake of high-fiber, whole grain complex carbohydrates. Emphasized the importance of hydration with calorie-free beverages and limiting sugar-sweetened beverages. Teach back method used.  Expect good compliance.  Corrin Parker, MS, RD, LDN Pager # [phone] After hours/ weekend pager # [phone]

## 2016-06-12 NOTE — Patient Care Conference (Signed)
Inpatient RehabilitationTeam Conference and Plan of Care Update Date: 06/11/2016   Time: 2:35 PM    Patient Name: Corey Sellers      Medical Record Number: [id number]  Date of Birth: [birth year] Sex: Male         Room/Bed: 4W18C/4W18C-01 Payor Info: Payor: BLUE CROSS BLUE SHIELD / Plan: [employer] OTHER / Product Type: *No Product type* /    Admitting Diagnosis: polytrauma  Admit Date/Time:  05/30/2016  3:46 PM Admission Comments: No comment available   Primary Diagnosis:  Trauma Principal Problem: Trauma  Patient Active Problem List   Diagnosis Date Noted  . Postprocedural fever 06/12/2016  . Diabetes mellitus type 2 in obese ([place])   . Morbid obesity due to excess calories ([place])   . Scrotal edema   . Hyponatremia   . Ileus ([place])   . Constipation   . Controlled type 2 diabetes mellitus with hyperglycemia, without long-term current use of insulin ([place])   . Closed fracture of lumbar vertebra ([place])   . Trauma 05/30/2016  . Fall 05/28/2016  . Multiple pelvic fractures ([place]) 05/28/2016  . Acute blood loss anemia 05/28/2016  . L3 vertebral fracture ([place]) 05/28/2016  . Urinary retention 05/28/2016  . Pelvic fracture ([place])   . Surgery, elective   . Benign essential HTN   . Diabetes mellitus type 2 without retinopathy ([place])   . Post-operative pain   . SIRS (systemic inflammatory response syndrome) ([facility])   . Thrombocytopenia ([place])   . Pelvic hematoma in male 05/24/2016    Expected Discharge Date: Expected Discharge Date: 06/13/16  Team Members Present: Physician leading conference: Dr. Delice Lesch Social Worker Present: Lennart Pall, LCSW Nurse Present: Other (comment) Verdis Frederickson, RN) PT Present: Dwyane Dee, PT OT Present: [name], Rhetta Mura, OT PPS Coordinator present : Ileana Ladd, PT     Current Status/Progress Goal Weekly Team Focus  Medical   Mobility and functional deficits secondary to polytrauma  Improve mobility and urinary retention  See  above   Bowel/Bladder   continent of bowel LBM 9/13. In and out cath q 6-8 hrs PRN  Continent of bowel and bladder   Encourage void with urinal q 4 hrs- reinforce education on in and out cath    Swallow/Nutrition/ Hydration             ADL's   supervision - set up A overal, min A bathing  bathing min A, supervision/set up for self care  d/c planning, activiity tolerance, self care retraining, balance   Mobility   supervision with RW for transfers, mod I w/c  supervision/mod I   grad day Thursday, ongoing patient education   Communication             Safety/Cognition/ Behavioral Observations            Pain             Skin   abdomen and hip incision ota  No new skin/breakdown  Continue to monitor skin q shift and PRN    Rehab Goals Patient on target to meet rehab goals: Yes *See Care Plan and progress notes for long and short-term goals.  Barriers to Discharge: Mobility, urinary retention    Possible Resolutions to Barriers:  Therapies, follow labs, optimize bladder meds    Discharge Planning/Teaching Needs:  Plans to return home with his wife who does work f/t, however, it taking ~ 3 weeks off to provide assistance.  Teaching begun this past weekend and ongoing.  Team Discussion:  Some improved urinary retention;  Pt learning self-caths.  On track to meet supervision/ some min assist goals.  May reach mod ind with stand-pivot tfs.  Need to complete cath training with wife prior to d/c.    Revisions to Treatment Plan:  None   Continued Need for Acute Rehabilitation Level of Care: The patient requires daily medical management by a physician with specialized training in physical medicine and rehabilitation for the following conditions: Daily direction of a multidisciplinary physical rehabilitation program to ensure safe treatment while eliciting the highest outcome that is of practical value to the patient.: Yes Daily medical management of patient stability for increased  activity during participation in an intensive rehabilitation regime.: Yes Daily analysis of laboratory values and/or radiology reports with any subsequent need for medication adjustment of medical intervention for : Mood/behavior problems;Urological problems  Abanoub Hanken 06/13/2016, 9:46 AM

## 2016-06-12 NOTE — Discharge Summary (Signed)
Physician Discharge Summary  Patient ID: Corey Sellers MRN: [id number] DOB/AGE: [birth year] 65 y.o.  Admit date: 05/30/2016 Discharge date: 06/13/2016  Discharge Diagnoses:  Principal Problem:   Trauma Active Problems:   Multiple pelvic fractures (HCC)   Acute blood loss anemia   L3 vertebral fracture (HCC)   Urinary retention   Constipation   Controlled type 2 diabetes mellitus with hyperglycemia, without long-term current use of insulin (HCC)   Closed fracture of lumbar vertebra (HCC)   Hyponatremia   Scrotal edema   Morbid obesity due to excess calories (HCC)   Postprocedural fever   Neurogenic dysfunction of the urinary bladder   Discharged Condition: stable.    Labs:  Basic Metabolic Panel: BMP Latest Ref Rng & Units 06/09/2016 06/05/2016 05/31/2016  Glucose 65 - 99 mg/dL 133(H) 119(H) 153(H)  BUN 6 - 20 mg/dL 23(H) 19 14  Creatinine 0.61 - 1.24 mg/dL 1.07 0.98 0.91  Sodium 135 - 145 mmol/L 133(L) 133(L) 133(L)  Potassium 3.5 - 5.1 mmol/L 4.5 4.3 4.0  Chloride 101 - 111 mmol/L 97(L) 98(L) 97(L)  CO2 22 - 32 mmol/L 23 26 27   Calcium 8.9 - 10.3 mg/dL 9.8 9.1 8.9    CBC: CBC Latest Ref Rng & Units 06/09/2016 06/05/2016 05/31/2016  WBC 4.0 - 10.5 K/uL 9.3 7.5 6.6  Hemoglobin 13.0 - 17.0 g/dL 10.8(L) 8.4(L) 8.0(L)  Hematocrit 39.0 - 52.0 % 34.0(L) 27.0(L) 24.3(L)  Platelets 150 - 400 K/uL 491(H) 227 199    CBG:  Recent Labs Lab 06/12/16 0648 06/12/16 1159 06/12/16 1629 06/12/16 2132 06/13/16 0633  GLUCAP 109* 145* 141* 113* 110*    Brief HPI:   Corey Sellers an 65 y.o.white male with history of T2DM, HTN, melanomawho sustained a fall off 8 feet off a scaffolding (working on [employer]) on 05/24/2016 onto his back and had immediate onset of back and pelvic pain with inability to walk. 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 He was taken to IR emergently for iliac artery embolization. Dr. Ronnald Ramp recommended LSO for L3 fracture--to be donned when supine and follow up repeat films in 2 weeks to monitor for stability. Dr. Marcelino Scot consulted for input due to complexity of pelvic fracture and patient underwent ORIF anterior pubic symphysis with transsacral screw fixation right and left on 05/27/16. To be NWB RLE for 8 weeks.  Patient has had issues with fever, abdominal distension with constipation as well as urinary retention.  Therapy ongoing and CIR recommended for follow up therapy.    Hospital Course: Corey Sellers was admitted to rehab 05/30/2016 for inpatient therapies to consist of PT and OT at least three hours five days a week. Past admission physiatrist, therapy team and rehab RN have worked together to provide customized collaborative inpatient rehab.  He was started on bowel program due to concerns of low grade ileus and he elected on liquid diet till bowels normalized. Simethicone was also scheduled to help manage bloating. He has been advanced to regular diet and po intake is good. Blood sugars started trending upwards therefore Amaryl was added and titrated to 4 mg daily for tighter blood sugar control.  Blood pressures have been stable and routine check of lytes showed mild hyponatremia. Follow up CBC reveals improvement in ABLA.  He continued to have problems with urinary retention despite increase in flomax to  0.8 mg and bethanecol to 50 mg tid.  He has required in and out caths every 4-6 hours to keep volumes < 350 cc. He was educated on keeping fluid diary and is to continue to cath 4-5 times a day to decompress bladder and keep volumes < 350 cc.    He was started on low dose lasix to help with scrotal which has improved greatly. He is tolerating this without side effects.  Lower abdominal incision is healing well without s/s of infections and sutures were removed on 9/12  without difficulty.  His pain is well controlled and outlook have improved. He has been educated on CM diet as well as importance of exercise and weight loss to help promote overall health.  He is currently at min assist to modified independent at wheelchair level. He will continue to receive follow up HHPT, [place] [facility] [place] [facility] after discharge.      Rehab course: During patient's stay in rehab weekly team conferences were held to monitor patient's progress, set goals and discuss barriers to discharge. At admission, patient required moderate assist with ADL tasks and mobility. He has had improvement in activity tolerance, balance, postural control, as well as ability to compensate for deficits.  He is able to complete bathing and dressing after set up assist and with use of AE.  He is able to don TLSO with total assist and once brace donned, is able to transition to EOB independently. He is modified independent for transfers at NWB on RLE and is able to propel his wheelchair independently.Family education was completed with wife regarding care and mobility.     Disposition: 01-Home or Self Care  Diet: Diabetic diet.   Special Instructions: 1. Do not get up without back brace. Do not raise up over 30 degrees in bed without brace. Back brace needs to be put on before getting out of bed.  2. NO weight on right left for 8 weeks total--Dr. Marcelino Scot to advise on weight bearing.  3. Increase Senna to twice a day if no BM in 2 days.   Discharge Instructions    Ambulatory referral to Physical Medicine Rehab    Complete by:  As directed    1-2 week follow up    4. Cath every 4-6 hours to keep bladder volumes less than 350 cc--stop drinking after 7 pm and use ice chips. As volumes start getting below 300 cc you can spread out cathing to 8 hrs then 12 hours.      Medication List    STOP taking these medications   etodolac 400 MG 24 hr tablet Commonly known as:  LODINE XL     TAKE these  medications   aspirin EC 81 MG tablet Take 81 mg by mouth every evening.   bethanechol 50 MG tablet Commonly known as:  URECHOLINE Take 1 tablet (50 mg total) by mouth 3 (three) times daily.   cetirizine 10 MG tablet Commonly known as:  ZYRTEC Take 10 mg by mouth every evening.   Cholecalciferol 4000 units Caps Take 1 capsule by mouth every evening.   cyclobenzaprine 10 MG tablet Commonly known as:  FLEXERIL Take 0.5-1 tablets (5-10 mg total) by mouth 3 (three) times daily as needed. For muscle spasms What changed:  how much to take  additional instructions   EPINEPHrine 0.3 mg/0.3 mL Soaj injection Commonly known as:  EPI-PEN Inject 0.3 mg into the muscle once as needed.   furosemide 20 MG tablet Commonly  known as:  LASIX Take 1 tablet (20 mg total) by mouth daily.   glimepiride 4 MG tablet Commonly known as:  AMARYL Take 1 tablet (4 mg total) by mouth daily with breakfast.   lidocaine 2 % jelly Commonly known as:  XYLOCAINE Apply topically as needed (Use with in and out catheter).   metFORMIN 500 MG 24 hr tablet Commonly known as:  GLUCOPHAGE-XR Take 1,000 mg by mouth every evening.   multivitamin with minerals Tabs tablet Take 1 tablet by mouth daily.   omeprazole 20 MG capsule Commonly known as:  PRILOSEC Take 20 mg by mouth daily.   rosuvastatin 40 MG tablet Commonly known as:  CRESTOR Take 20 mg by mouth every evening.   senna-docusate 8.6-50 MG tablet Commonly known as:  Senokot-S Take 1 tablet by mouth at bedtime.   simethicone 80 MG chewable tablet Commonly known as:  MYLICON Chew 1 tablet (80 mg total) by mouth 4 (four) times daily.   sitaGLIPtin 100 MG tablet Commonly known as:  JANUVIA Take 100 mg by mouth every evening.   tamsulosin 0.4 MG Caps capsule Commonly known as:  FLOMAX Take 2 capsules (0.8 mg total) by mouth daily.   traMADol 50 MG tablet--Rx # 14 pills Commonly known as:  ULTRAM Take 1 tablet (50 mg total) by mouth every  12 (twelve) hours as needed for severe pain.   TRIBENZOR 40-10-25 MG Tabs Generic drug:  Olmesartan-Amlodipine-HCTZ Take 1 tablet by mouth every evening.      Follow-up Information    Ankit Lorie Phenix, MD .   Specialty:  Physical Medicine and Rehabilitation Why:  office will call you with follow up appointment Contact information: [address] [place] [address] [phone]        Rozanna Box, MD. Call today.   Specialty:  Orthopedic Surgery Why:  for follow up appointment Contact information: [place] [address] [phone]        Eustace Moore, MD. Call today.   Specialty:  Neurosurgery Why:  for follow up appointment Contact information: [address] [place] [address] [phone]        Caren Macadam, MD Follow up on 07/03/2016.   Specialty:  Urology Why:  Appointment at 9:45 for urinary retention Contact information: [place] [address] [phone]        Reed Pandy Follow up on 07/10/2016.   Why:  @ 9:45 am (please arrive by 9:30 am) Contact information: [facility]  Dept of [place], [place], [place] [address]   [phone]          Signed: Bary Leriche 06/13/2016, 5:25 PM

## 2016-06-13 DIAGNOSIS — N319 Neuromuscular dysfunction of bladder, unspecified: Secondary | ICD-10-CM

## 2016-06-13 LAB — GLUCOSE, CAPILLARY: Glucose-Capillary: 110 mg/dL — ABNORMAL HIGH (ref 65–99)

## 2016-06-13 MED ORDER — SIMETHICONE 80 MG PO CHEW: 80.0000 mg | CHEWABLE_TABLET | Freq: Four times a day (QID) | ORAL | 0 refills | Status: AC

## 2016-06-13 MED ORDER — FUROSEMIDE 20 MG PO TABS: 20.0000 mg | ORAL_TABLET | Freq: Every day | ORAL | 0 refills | Status: AC

## 2016-06-13 MED ORDER — LIDOCAINE HCL 2 % EX GEL: CUTANEOUS | 0 refills | Status: DC | PRN

## 2016-06-13 MED ORDER — BETHANECHOL CHLORIDE 50 MG PO TABS: 50.0000 mg | ORAL_TABLET | Freq: Three times a day (TID) | ORAL | 0 refills | Status: DC

## 2016-06-13 MED ORDER — TRAMADOL HCL 50 MG PO TABS: 50.0000 mg | ORAL_TABLET | Freq: Two times a day (BID) | ORAL | 0 refills | Status: AC | PRN

## 2016-06-13 MED ORDER — GLIMEPIRIDE 4 MG PO TABS: 4.0000 mg | ORAL_TABLET | Freq: Every day | ORAL | 0 refills | Status: DC

## 2016-06-13 MED ORDER — CYCLOBENZAPRINE HCL 10 MG PO TABS: 5.0000 mg | ORAL_TABLET | Freq: Three times a day (TID) | ORAL | 0 refills | Status: DC | PRN

## 2016-06-13 MED ORDER — SENNOSIDES-DOCUSATE SODIUM 8.6-50 MG PO TABS: 1.0000 | ORAL_TABLET | Freq: Every day | ORAL | 0 refills | Status: DC

## 2016-06-13 MED ORDER — TAMSULOSIN HCL 0.4 MG PO CAPS: 0.8000 mg | ORAL_CAPSULE | Freq: Every day | ORAL | 0 refills | Status: DC

## 2016-06-13 NOTE — Progress Notes (Signed)
Patient voided 500 cc(last at 6 am) X 2 last night but could not get cath kits to check PVR. Assisted patient with self cath set up--PVR 625 cc. Discussed sterile procedure v/s clean procedure, need to cath 4-5 times a day with goal of 350 cc or less and need to record fluid intake.

## 2016-06-13 NOTE — Discharge Instructions (Signed)
Inpatient Rehab Discharge Instructions  Corey Sellers Discharge date and time:  06/13/16  Activities/Precautions/ Functional Status: Activity: no lifting, driving, or strenuous exercise  till cleared by MD.  NO WEIGHT ON RIGHT LEG Diet: diabetic diet Wound Care: keep wound clean and dry.  Contact MD if you develop any problems with your incision/wound--redness, swelling, increase in pain, drainage or if you develop fever or chills.    Functional status:  ___ No restrictions     ___ Walk up steps independently _X__ 24/7 supervision/assistance   ___ Walk up steps with assistance ___ Intermittent supervision/assistance  ___ Bathe/dress independently ___ Walk with walker     _X__ Bathe/dress with assistance ___ Walk Independently    ___ Shower independently ___ Walk with assistance    ___ Shower with assistance _X__ No alcohol     ___ Return to work/school ________    COMMUNITY REFERRALS UPON DISCHARGE:    Home Health:   PT     OT     RN                    Agency:  [facility] Phone: [phone]   Medical Equipment/Items Ordered:  Wheelchair, cushion, hospital bed, rolling walker, commode                                                       Agency/Supplier:  [facility] @ [phone]   [facility]  [address] [place], [place]    Special Instructions: 1. Do not get up without back brace. Do not raise up over 30 degrees in bed without brace. Back brace needs to be put on before getting out of bed.  2. NO weight on right left for 8 weeks total--Dr. Marcelino Scot to advise on weight bearing.  3. Increase Senna to twice a day if no BM in 2 days 4. Cath every 4-6 hours to keep bladder volumes less than 350 cc--stop drinking after 7 pm and use ice chips. As volumes start getting below 300 cc you can spread out cathing to 8 hrs then 12 hours.     My questions have been answered and I understand these instructions. I will adhere to  these goals and the provided educational materials after my discharge from the hospital.  Patient/Caregiver Signature _______________________________ Date __________  Clinician Signature _______________________________________ Date __________  Please bring this form and your medication list with you to all your follow-up doctor's appointments.

## 2016-06-13 NOTE — Progress Notes (Signed)
Pt prepared for discharge. Pt given discharge instructions and all questions/ concerns were addressed. Pt sent home with belongings and discharged to home with wife.

## 2016-06-13 NOTE — Progress Notes (Signed)
Social Work Discharge Note  The overall goal for the admission was met for:   Discharge location: Yes - home with wife who can provide 24/7 assist initially  Length of Stay: Yes - 14 days  Discharge activity level: Yes - mod ind to min assist overall  Home/community participation: Yes  Services provided included: MD, RD, PT, OT, RN, TR, Pharmacy and [place]: [facility]  Follow-up services arranged: Home Health: RN, PT, OT via [facility], DME: 20x18 lightweight w/c with tension back, basic cushion, rolling walker, hospital bed and wide drop arm commode via [facility] and Patient/Family has no preference for HH/DME agencies  Comments (or additional information):  Patient/Family verbalized understanding of follow-up arrangements: Yes  Individual responsible for coordination of the follow-up plan: pt  Confirmed correct DME delivered: Mar[facility] B[facility] 06/13/2016    Artia Singley

## 2016-06-13 NOTE — Progress Notes (Signed)
[facility] PHYSICAL MEDICINE & REHABILITATION     PROGRESS NOTE    Subjective/Complaints: Pt sitting up in bed.  He says, "I'm ready to go home".  ROS:  Denies CP, SOB, N/V/D.  Objective: Vital Signs: Blood pressure (!) 142/64, pulse 94, temperature 98.5 F (36.9 C), temperature source Oral, resp. rate 18, height 5\' 9"  (1.753 m), weight 109.3 kg (240 lb 15.4 oz), SpO2 97 %. No results found. No results for input(s): WBC, HGB, HCT, PLT in the last 72 hours. No results for input(s): NA, K, CL, GLUCOSE, BUN, CREATININE, CALCIUM in the last 72 hours.  Invalid input(s): CO CBG (last 3)   Recent Labs  06/12/16 1629 06/12/16 2132 06/13/16 0633  GLUCAP 141* 113* 110*    Wt Readings from Last 3 Encounters:  06/03/16 109.3 kg (240 lb 15.4 oz)  05/24/16 112 kg (247 lb)  12/28/13 106.1 kg (234 lb)    Physical Exam:  Constitutional: He appears well-developed and well-nourished.  NAD.  HENT: Normocephalic and atraumatic.  Eyes: Conjunctivae and EOM are normal.  Cardiovascular: Normal rate and regular rhythm.  Exam reveals no gallop and no friction rub.  No murmur heard. Respiratory: Effort normal and breath sounds normal. No stridor. No respiratory distress. He has no wheezes.  GI: Bowel sounds are present. There is no tenderness.  Musculoskeletal: He exhibits scrotal edema (improving). He exhibits no tenderness.  Neurological: He is alert and oriented.  Speech clear.  Able to follow basic commands without difficulty.  UE strength grossly 5/5.  RLE: 5-/5HF , 5/5 KE and 5/5 ADF/PF.  LLE: 5-/5HF , 5/5 KE and 5/5 ADF/PF.  No sensory changes.  Skin: Skin is warm and dry.  Hypogastric incision c/d/i. Right hip incision c/d/i. Psychiatric: He has a flat mood and affect (improving). His behavior is normal. Thought content normal.    Assessment/Plan: 1. Mobility and functional deficits secondary to polytrauma which require 3+ hours per day of interdisciplinary therapy in a  comprehensive inpatient rehab setting. Physiatrist is providing close team supervision and 24 hour management of active medical problems listed below. Physiatrist and rehab team continue to assess barriers to discharge/monitor patient progress toward functional and medical goals.  Function:  Bathing Bathing position   Position: Bed  Bathing parts Body parts bathed by patient: Right arm, Left arm, Front perineal area, Right upper leg, Left upper leg, Right lower leg, Left lower leg, Buttocks, Abdomen, Chest Body parts bathed by helper: Buttocks  Bathing assist Assist Level: Set up, Supervision or verbal cues   Set up : To obtain items  Upper Body Dressing/Undressing Upper body dressing   What is the patient wearing?: Orthosis     Pull over shirt/dress - Perfomed by patient: Thread/unthread right sleeve, Thread/unthread left sleeve, Put head through opening, Pull shirt over trunk Pull over shirt/dress - Perfomed by helper: Pull shirt over trunk     Orthosis activity level: Performed by helper  Upper body assist Assist Level: Set up   Set up : To apply TLSO, cervical collar  Lower Body Dressing/Undressing Lower body dressing   What is the patient wearing?: Shoes     Pants- Performed by patient: Thread/unthread right pants leg, Thread/unthread left pants leg, Pull pants up/down Pants- Performed by helper: Thread/unthread right pants leg, Thread/unthread left pants leg, Pull pants up/down Non-skid slipper socks- Performed by patient: Don/doff right sock, Don/doff left sock Non-skid slipper socks- Performed by helper: Don/doff right sock, Don/doff left sock     Shoes - Performed  by patient: Don/doff right shoe, Don/doff left shoe            Lower body assist Assist for lower body dressing: Set up Assistive Device Comment: [place] activity did not occur: Refused Toileting steps completed by patient: Adjust clothing prior to toileting, Adjust  clothing after toileting, Performs perineal hygiene Toileting steps completed by helper: Performs perineal hygiene Toileting Assistive Devices: Toilet aid  Toileting assist Assist level: Supervision or verbal cues, Set up/obtain supplies   Transfers Chair/bed transfer   Chair/bed transfer method: Stand pivot Chair/bed transfer assist level: No Help, no cues, assistive device, takes more than a reasonable amount of time Chair/bed transfer assistive device: Armrests, Medical sales representative Ambulation activity did not occur: Safety/medical concerns         Wheelchair   Type: Manual Max wheelchair distance: 500 Assist Level: No help, No cues, assistive device, takes more than reasonable amount of time  Cognition Comprehension Comprehension assist level: Follows complex conversation/direction with no assist  Expression Expression assist level: Expresses complex ideas: With no assist  Social Interaction Social Interaction assist level: Interacts appropriately with others - No medications needed.  Problem Solving Problem solving assist level: Solves complex problems: Recognizes & self-corrects  Memory Memory assist level: More than reasonable amount of time   Medical Problem List and Plan: 1.  Mobility and functional deficits secondary to polytrauma as above  -D/c today 2.  DVT Prophylaxis/Anticoagulation: Pharmaceutical: Lovenox  Dopplers neg for DVT on 9/3 3. Pain Management: Oxycodone prn effective.  4. Mood: LCSW to follow for evaluation and support.  5. Neuropsych: This patient is capable of making decisions on his own behalf. 6. Skin/Wound Care: Monitor incisions for healing.  7. Fluids/Electrolytes/Nutrition: Monitor I/O. Encourage po.   Now back to regular consistency diet  Cont encourage fluids 8. L3 endplate fracture: Back precautions --don LSO when patient supine 9. ABLA:   Hb 10.8 on 9/11 (improving) 10. Fevers: Resolved  Encourage IS  -UA negative, UCX  negative 11.  Urinary retention:  Improving  Likely due to pelvic hematoma.   -continue flomax  -Urecholine 10 TID started on 9/5, increased to 25 on 9/6, increased to 50 on 9/7  -foley d/ced 9/4  -Will need I/O cath at home 12. T2DM: Monitor BS ac/hs.   On metformin, tradjenta  Started Amaryl 1mg  on 9/4, increased 2mg  on 9/6, increased to 4mg  on 9/10  Relatively controlled  Will cont to monitor 13. HTN: Monitor BP   Continue avapro, norvasc and HCTZ.   Fairly controlled at present  Monitor with increased activity 14. Constipation:  Improved  miralax bid, increased on 9/5  constipation on KUB 15. Hyponatremia  Na 133 on 9/11 (stable)  Cont to monitor 16. Scrotal edema  Started lasix 20mg  daily on 9/8  Improving slowly 17. Morbid obesity  Body mass index is 35.58 kg/m.  Diet and exercise education  Encourage weight loss to increase endurance and promote overall health  LOS (Days) 14 A FACE TO FACE EVALUATION WAS PERFORMED  Jaylea Plourde Lorie Phenix 06/13/2016 9:04 AM

## 2016-06-25 ENCOUNTER — Encounter
Payer: BLUE CROSS/BLUE SHIELD | Attending: Physical Medicine & Rehabilitation | Admitting: Physical Medicine & Rehabilitation

## 2016-06-25 ENCOUNTER — Encounter: Payer: Self-pay | Admitting: Physical Medicine & Rehabilitation

## 2016-06-25 VITALS — BP 124/71 | HR 106 | Resp 14

## 2016-06-25 DIAGNOSIS — E119 Type 2 diabetes mellitus without complications: Secondary | ICD-10-CM | POA: Diagnosis not present

## 2016-06-25 DIAGNOSIS — W19XXXD Unspecified fall, subsequent encounter: Secondary | ICD-10-CM

## 2016-06-25 DIAGNOSIS — I10 Essential (primary) hypertension: Secondary | ICD-10-CM | POA: Insufficient documentation

## 2016-06-25 DIAGNOSIS — N5089 Other specified disorders of the male genital organs: Secondary | ICD-10-CM | POA: Insufficient documentation

## 2016-06-25 DIAGNOSIS — Z87891 Personal history of nicotine dependence: Secondary | ICD-10-CM | POA: Diagnosis not present

## 2016-06-25 DIAGNOSIS — Z09 Encounter for follow-up examination after completed treatment for conditions other than malignant neoplasm: Secondary | ICD-10-CM | POA: Insufficient documentation

## 2016-06-25 DIAGNOSIS — E1165 Type 2 diabetes mellitus with hyperglycemia: Secondary | ICD-10-CM

## 2016-06-25 DIAGNOSIS — S32000S Wedge compression fracture of unspecified lumbar vertebra, sequela: Secondary | ICD-10-CM

## 2016-06-25 DIAGNOSIS — T1490XA Injury, unspecified, initial encounter: Secondary | ICD-10-CM

## 2016-06-25 DIAGNOSIS — G8918 Other acute postprocedural pain: Secondary | ICD-10-CM | POA: Insufficient documentation

## 2016-06-25 DIAGNOSIS — T1490 Injury, unspecified: Secondary | ICD-10-CM | POA: Diagnosis not present

## 2016-06-25 DIAGNOSIS — Z85820 Personal history of malignant melanoma of skin: Secondary | ICD-10-CM | POA: Diagnosis not present

## 2016-06-25 MED ORDER — BETHANECHOL CHLORIDE 50 MG PO TABS: 50.0000 mg | ORAL_TABLET | Freq: Three times a day (TID) | ORAL | 0 refills | Status: DC

## 2016-06-25 NOTE — Progress Notes (Signed)
Subjective:    Patient ID: Corey Sellers, male    DOB: [birth year], 65 y.o.   MRN: [id number]  HPI 65 y.o. white male with history of T2DM, HTN, melanoma who sustained a fall off 8 feet off a scaffolding with resulting pelvic and L3 endplate fracture presents for hospital follow up after receiving CIR.  Since discharge, his pain has been controlled. He continues to wear his TLSO.  His urinary retention has resolved. He has not been checking his CBGs.  His scrotal edema has significantly improved, but still persists.  He sees Ortho later today. He sees Neurosurg next week.  He sees PCP on 10/9. He sees Urology on 10/5.  Remain NWB right leg. He is having BMs daily.  DME: [facility], wheelchair, hospital bed, bedside commode Mobility: Wheelchair Therapies: 2/week PT, OT on hold   Pain Inventory Average Pain 3 Pain Right Now 3 My pain is dull  In the last 24 hours, has pain interfered with the following? General activity 0 Relation with others 0 Enjoyment of life 0 What TIME of day is your pain at its worst? varies Sleep (in general) Fair  Pain is worse with: some activites Pain improves with: medication Relief from Meds: 6  Mobility how many minutes can you walk? not allowed ability to climb steps?  no do you drive?  no use a wheelchair transfers alone  Function employed # of hrs/week 40 I need assistance with the following:  bathing, toileting, meal prep and shopping  Neuro/Psych No problems in this area  Prior Studies hospital f/u  Physicians involved in your care hospital f/u   Family History  Problem Relation Age of Onset  . High Cholesterol Mother   . Mesothelioma Father    Social History   Social History  . Marital status: Married    Spouse name: N/A  . Number of children: N/A  . Years of education: N/A   Social History Main Topics  . Smoking status: Former Smoker    Quit date: 05/18/1999  . Smokeless tobacco: Never Used  . Alcohol use Yes   Comment: occ  . Drug use: No  . Sexual activity: Not Asked   Other Topics Concern  . None   Social History Narrative  . None   Past Surgical History:  Procedure Laterality Date  . APPENDECTOMY    . BASAL CELL CARCINOMA EXCISION     left chin  . EXCISION METACARPAL MASS Right 05/23/2013   Procedure: RIGHT LONG EXCISION ANNULAR LIGAMENT CYST;  Surgeon: Tennis Must, MD;  Location: [facility];  Service: Orthopedics;  Laterality: Right;  . GANGLION CYST EXCISION Left 06/06/2013   Procedure: LEFT WRIST EXCISION MASS;  Surgeon: Tennis Must, MD;  Location: [facility];  Service: Orthopedics;  Laterality: Left;  . IR GENERIC HISTORICAL  05/24/2016   IR ANGIOGRAM PELVIS SELECTIVE OR SUPRASELECTIVE 05/24/2016 Arne Cleveland, MD [facility]  . IR GENERIC HISTORICAL  05/24/2016   IR US GUIDE VASC ACCESS RIGHT 05/24/2016 Arne Cleveland, MD [facility]  . IR GENERIC HISTORICAL  05/24/2016   IR EMBO ART  VEN HEMORR LYMPH EXTRAV  INC GUIDE ROADMAPPING 05/24/2016 Arne Cleveland, MD [facility]  . JOINT REPLACEMENT  2003,2004   Rt shoulder  . ORIF PELVIC FRACTURE N/A 05/27/2016   Procedure: OPEN REDUCTION INTERNAL FIXATION (ORIF) SYMPHYSIS PUBIS W/ TRANSSACRAL SCREWS;  Surgeon: Altamese Harrison, MD;  Location: [facility];  Service: Orthopedics;  Laterality: N/A;  . PAROTIDECTOMY W/  NECK DISSECTION TOTAL  2013   left parotidectomy-26 nodes removed  . TONSILLECTOMY    . TRIGGER FINGER RELEASE Right 05/23/2013   Procedure: RELEASE TRIGGER FINGER/A-1 PULLEY RIGHT INDEX AND LONG;  Surgeon: Tennis Must, MD;  Location: [facility];  Service: Orthopedics;  Laterality: Right;  . TRIGGER FINGER RELEASE Left 06/06/2013   Procedure: RELEASE TRIGGER FINGER/A-1 PULLEY LEFT SMALL;  Surgeon: Tennis Must, MD;  Location: [facility];  Service: Orthopedics;  Laterality: Left;  Marland Kitchen VARICOCELE EXCISION     left   Past Medical History:  Diagnosis Date  . Diabetes  mellitus   . GERD (gastroesophageal reflux disease)   . Hypertension   . 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 Wears glasses    BP 124/71 (BP Location: Right Arm, Patient Position: Sitting, Cuff Size: Large)   Pulse (!) 106   Resp 14   SpO2 94%   Opioid Risk Score:   Fall Risk Score:  `1  Depression screen PHQ 2/9  Depression screen PHQ 2/9 06/25/2016  Decreased Interest 0  Down, Depressed, Hopeless 0  PHQ - 2 Score 0  Altered sleeping 0  Tired, decreased energy 0  Change in appetite 0  Feeling bad or failure about yourself  0  Trouble concentrating 0  Moving slowly or fidgety/restless 0  Suicidal thoughts 0  PHQ-9 Score 0  Difficult doing work/chores Not difficult at all    Review of Systems  Constitutional: Negative.   HENT: Negative.   Eyes: Negative.   Respiratory: Negative.   Cardiovascular: Negative.   Endocrine: Negative.   Genitourinary: Negative.   Musculoskeletal: Negative.   Skin: Negative.   Allergic/Immunologic: Negative.   Hematological: Negative.   Psychiatric/Behavioral: Negative.   All other systems reviewed and are negative.      Objective:   Physical Exam Constitutional: He appears well-developed and well-nourished.  NAD.  HENT: Normocephalic and atraumatic.  Eyes: Conjunctivae and EOM are normal.  Cardiovascular: Normal rate and regular rhythm.  Exam reveals no gallop and no friction rub.  No murmur heard. Respiratory: Effort normal and breath sounds normal. No stridor. No respiratory distress. He has no wheezes.  GI: Bowel sounds are present. There is no tenderness. GU: Scrotal edema improved  Musculoskeletal: No edema or tenderness in extremities Neurological: He is alert and oriented.  Speech clear.  Able to follow basic commands without difficulty.  UE strength grossly 5/5.  RLE: 5-/5HF , 5/5 KE and 5/5 ADF/PF.  LLE: 5-/5HF , 5/5 KE and 5/5 ADF/PF.  No sensory changes.  Skin: Skin  is warm and dry.  Hypogastric incision healed Right hip incision healed. Psychiatric: He has normal affect and mood.    Assessment & Plan:  65 y.o. white male with history of T2DM, HTN, melanoma who sustained a fall off 8 feet off a scaffolding with resulting pelvic and L3 endplate fracture presents for hospital follow up after receiving CIR.  1. Polytrauma   Cont therapies  Cont follow Neurosurg, Ortho  Cont TLSO  2. Post-op pain Management  Controlled at present  3. Urinary retention: Resolved  Will gradually wean  4. T2DM  Encouraged to follow CBGs  Cont meds  Follow up with PCP regarding changes to meds  5. HTN:   Cont meds  Follow up with PCP   6. Scrotal edema             Cont lasix  Will likely be able to d/c soon.    7. Constipation: Resolved   Meds reviewed Referrals reviewed All questions answered

## 2016-07-31 ENCOUNTER — Encounter
Payer: BLUE CROSS/BLUE SHIELD | Attending: Physical Medicine & Rehabilitation | Admitting: Physical Medicine & Rehabilitation

## 2016-07-31 ENCOUNTER — Encounter: Payer: Self-pay | Admitting: Physical Medicine & Rehabilitation

## 2016-07-31 VITALS — BP 147/77 | HR 101

## 2016-07-31 DIAGNOSIS — I10 Essential (primary) hypertension: Secondary | ICD-10-CM | POA: Insufficient documentation

## 2016-07-31 DIAGNOSIS — G8918 Other acute postprocedural pain: Secondary | ICD-10-CM | POA: Insufficient documentation

## 2016-07-31 DIAGNOSIS — Z09 Encounter for follow-up examination after completed treatment for conditions other than malignant neoplasm: Secondary | ICD-10-CM | POA: Diagnosis not present

## 2016-07-31 DIAGNOSIS — S32000S Wedge compression fracture of unspecified lumbar vertebra, sequela: Secondary | ICD-10-CM

## 2016-07-31 DIAGNOSIS — E119 Type 2 diabetes mellitus without complications: Secondary | ICD-10-CM | POA: Insufficient documentation

## 2016-07-31 DIAGNOSIS — T1490XA Injury, unspecified, initial encounter: Secondary | ICD-10-CM | POA: Diagnosis not present

## 2016-07-31 DIAGNOSIS — N5089 Other specified disorders of the male genital organs: Secondary | ICD-10-CM

## 2016-07-31 DIAGNOSIS — M461 Sacroiliitis, not elsewhere classified: Secondary | ICD-10-CM | POA: Diagnosis not present

## 2016-07-31 DIAGNOSIS — Z87891 Personal history of nicotine dependence: Secondary | ICD-10-CM | POA: Insufficient documentation

## 2016-07-31 DIAGNOSIS — M62830 Muscle spasm of back: Secondary | ICD-10-CM

## 2016-07-31 DIAGNOSIS — Z85820 Personal history of malignant melanoma of skin: Secondary | ICD-10-CM | POA: Insufficient documentation

## 2016-07-31 MED ORDER — CYCLOBENZAPRINE HCL 10 MG PO TABS: 5.0000 mg | ORAL_TABLET | Freq: Every day | ORAL | 0 refills | Status: AC | PRN

## 2016-07-31 NOTE — Progress Notes (Signed)
Subjective:    Patient ID: Corey Sellers, male    DOB: Feb 02, 1951, 65 y.o.   MRN: [id number]  HPI 65 y.o. white male with history of T2DM, HTN, melanoma who sustained a fall off 8 feet off a scaffolding with resulting pelvic and L3 endplate fracture presents for follow up after polytrauma.   Last clinic visit 06/25/16.  Since that visit, his TLSO has been discontinues.  Pain remains controlled. Urinary symptoms resolved.  His scrotal edema is minimal, but still present.  Per Ortho, pt able to gradually bear more weight.  He states he was never contacted by OT, but does not believe he needs them.  He is in outpatient therapies 3/week, aquatic therapy.  He is no longer taking lasix.  Overall, pt is progressing.    Pain Inventory Average Pain 3 Pain Right Now 4 My pain is aching  In the last 24 hours, has pain interfered with the following? General activity 3 Relation with others 3 Enjoyment of life 6 What TIME of day is your pain at its worst? n/a Sleep (in general) Fair  Pain is worse with: bending Pain improves with: n/a Relief from Meds: 0  Mobility use a walker how many minutes can you walk? not allowed ability to climb steps?  yes do you drive?  yes use a wheelchair transfers alone  Function employed # of hrs/week 40  Neuro/Psych trouble walking  Prior Studies x-rays CT/MRI  Physicians involved in your care Orthopedist Dr Shon Baton, Dr. Marcelino Scot   Family History  Problem Relation Age of Onset  . High Cholesterol Mother   . Mesothelioma Father    Social History   Social History  . Marital status: Married    Spouse name: N/A  . Number of children: N/A  . Years of education: N/A   Social History Main Topics  . Smoking status: Former Smoker    Quit date: 05/18/1999  . Smokeless tobacco: Never Used  . Alcohol use Yes     Comment: occ  . Drug use: No  . Sexual activity: Not Asked   Other Topics Concern  . None   Social History Narrative  . None    Past Surgical History:  Procedure Laterality Date  . APPENDECTOMY    . BASAL CELL CARCINOMA EXCISION     left chin  . EXCISION METACARPAL MASS Right 05/23/2013   Procedure: RIGHT LONG EXCISION ANNULAR LIGAMENT CYST;  Surgeon: Tennis Must, MD;  Location: [facility];  Service: Orthopedics;  Laterality: Right;  . GANGLION CYST EXCISION Left 06/06/2013   Procedure: LEFT WRIST EXCISION MASS;  Surgeon: Tennis Must, MD;  Location: [facility];  Service: Orthopedics;  Laterality: Left;  . IR GENERIC HISTORICAL  05/24/2016   IR ANGIOGRAM PELVIS SELECTIVE OR SUPRASELECTIVE 05/24/2016 Arne Cleveland, MD [facility]  . IR GENERIC HISTORICAL  05/24/2016   IR US GUIDE VASC ACCESS RIGHT 05/24/2016 Arne Cleveland, MD [facility]  . IR GENERIC HISTORICAL  05/24/2016   IR EMBO ART  VEN HEMORR LYMPH EXTRAV  INC GUIDE ROADMAPPING 05/24/2016 Arne Cleveland, MD [facility]  . JOINT REPLACEMENT  2003,2004   Rt shoulder  . ORIF PELVIC FRACTURE N/A 05/27/2016   Procedure: OPEN REDUCTION INTERNAL FIXATION (ORIF) SYMPHYSIS PUBIS W/ TRANSSACRAL SCREWS;  Surgeon: Altamese Weston, MD;  Location: [place] [facility];  Service: Orthopedics;  Laterality: N/A;  . PAROTIDECTOMY W/ NECK DISSECTION TOTAL  2013   left parotidectomy-26 nodes removed  . TONSILLECTOMY    .  TRIGGER FINGER RELEASE Right 05/23/2013   Procedure: RELEASE TRIGGER FINGER/A-1 PULLEY RIGHT INDEX AND LONG;  Surgeon: Tennis Must, MD;  Location: [facility];  Service: Orthopedics;  Laterality: Right;  . TRIGGER FINGER RELEASE Left 06/06/2013   Procedure: RELEASE TRIGGER FINGER/A-1 PULLEY LEFT SMALL;  Surgeon: Tennis Must, MD;  Location: [facility];  Service: Orthopedics;  Laterality: Left;  Marland Kitchen VARICOCELE EXCISION     left   Past Medical History:  Diagnosis Date  . Diabetes mellitus   . GERD (gastroesophageal reflux disease)   . Hypertension   . 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 Wears glasses    BP (!) 147/77   Pulse (!) 101   SpO2 97%   Opioid Risk Score:   Fall Risk Score:  `1  Depression screen PHQ 2/9  Depression screen PHQ 2/9 06/25/2016  Decreased Interest 0  Down, Depressed, Hopeless 0  PHQ - 2 Score 0  Altered sleeping 0  Tired, decreased energy 0  Change in appetite 0  Feeling bad or failure about yourself  0  Trouble concentrating 0  Moving slowly or fidgety/restless 0  Suicidal thoughts 0  PHQ-9 Score 0  Difficult doing work/chores Not difficult at all    Review of Systems  Constitutional: Negative.   HENT: Negative.   Eyes: Negative.   Respiratory: Negative.   Cardiovascular: Negative.   Endocrine: Negative.   Genitourinary: Negative.   Musculoskeletal: Positive for gait problem.  Skin: Negative.   Allergic/Immunologic: Negative.   Hematological: Negative.   Psychiatric/Behavioral: Negative.   All other systems reviewed and are negative.     Objective:   Physical Exam Constitutional: He appears well-developed and well-nourished.  NAD. HENT: Normocephalic and atraumatic.  Eyes: EOMI. No discharge.  Cardiovascular: RRR. No JVD. Respiratory: Effort normal and breath sounds normal. GI: Bowel sounds are present. There is no tenderness. GU: Scrotal edema improving, not examined today.    Musculoskeletal: No edema or tenderness in extremities Neurological: He is alert and oriented.  Able to follow basic commands without difficulty.  UE strength grossly 5/5.  RLE: 5/5HF , 5/5 KE and 5/5 ADF/PF.  LLE: 5/5HF , 5/5 KE and 5/5 ADF/PF.  No sensory changes.  Skin: Skin is warm and dry. Incisions healed. Psychiatric: He has normal affect and mood.    Assessment & Plan:  65 y.o. white male with history of T2DM, HTN, melanoma who sustained a fall off 8 feet off a scaffolding with resulting pelvic and L3 endplate fracture presents for hospital follow up for polytrauma.   1. Polytrauma   Cont therapies  Cont  follow Ortho  Discharged from Neurosurg  TLSO d/ced  2. Post-op pain Management:  Controlled at present, mainly with back pain and spasms  Flexeril refilled  3. Urinary retention: Resolved  4. T2DM  Encouraged to follow CBGs  Cont meds  Follow up with PCP regarding changes to meds  5. HTN:   Slightly elevated today  Cont meds  Pt to call PCP to schedule appointment  6. Scrotal edema:  Almost resolved  7. Constipation: Resolved

## 2016-10-31 ENCOUNTER — Ambulatory Visit: Payer: Self-pay | Admitting: Physical Medicine & Rehabilitation

## 2018-08-30 IMAGING — CR DG ABD PORTABLE 1V
2 series · 2 of 2 positions shown · non-contrast
Comparison: 05/27/2016

CLINICAL DATA: Ileus, abdominal distention

EXAM:
PORTABLE ABDOMEN - 1 VIEW

[AP (1 of 2)]
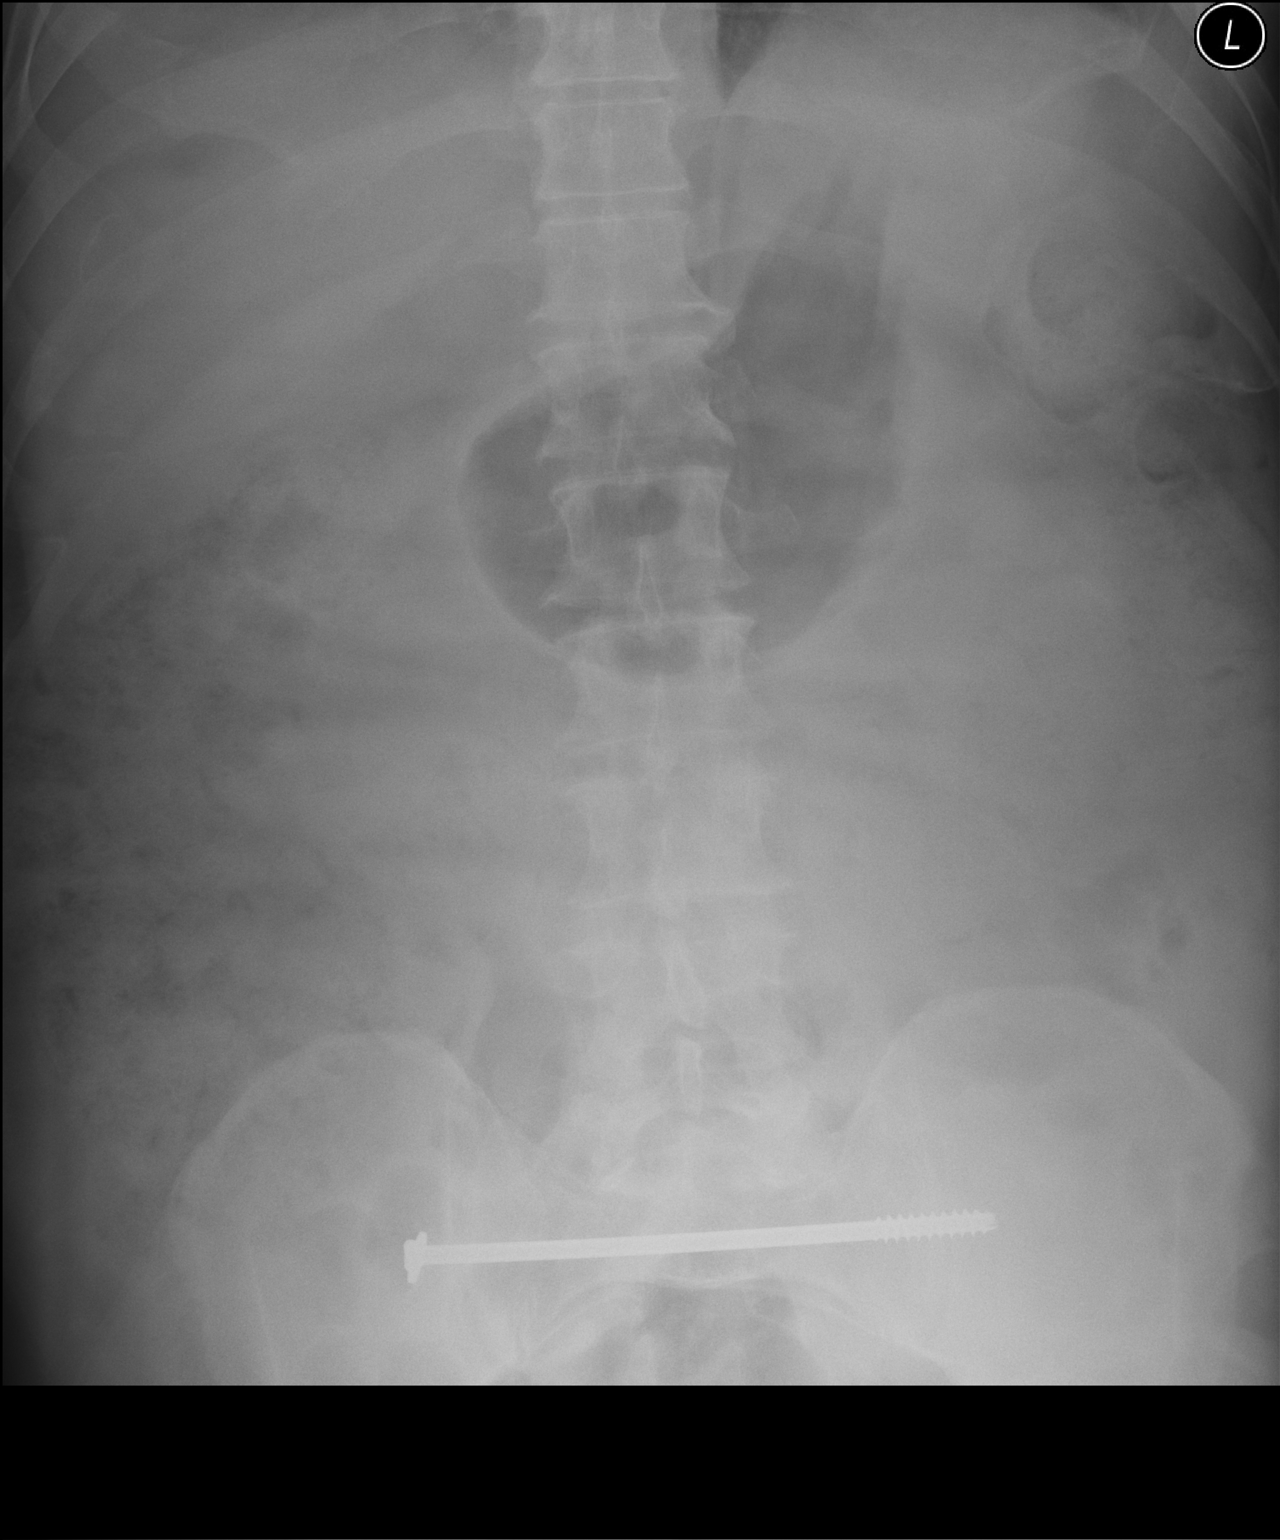

[AP (2 of 2)]
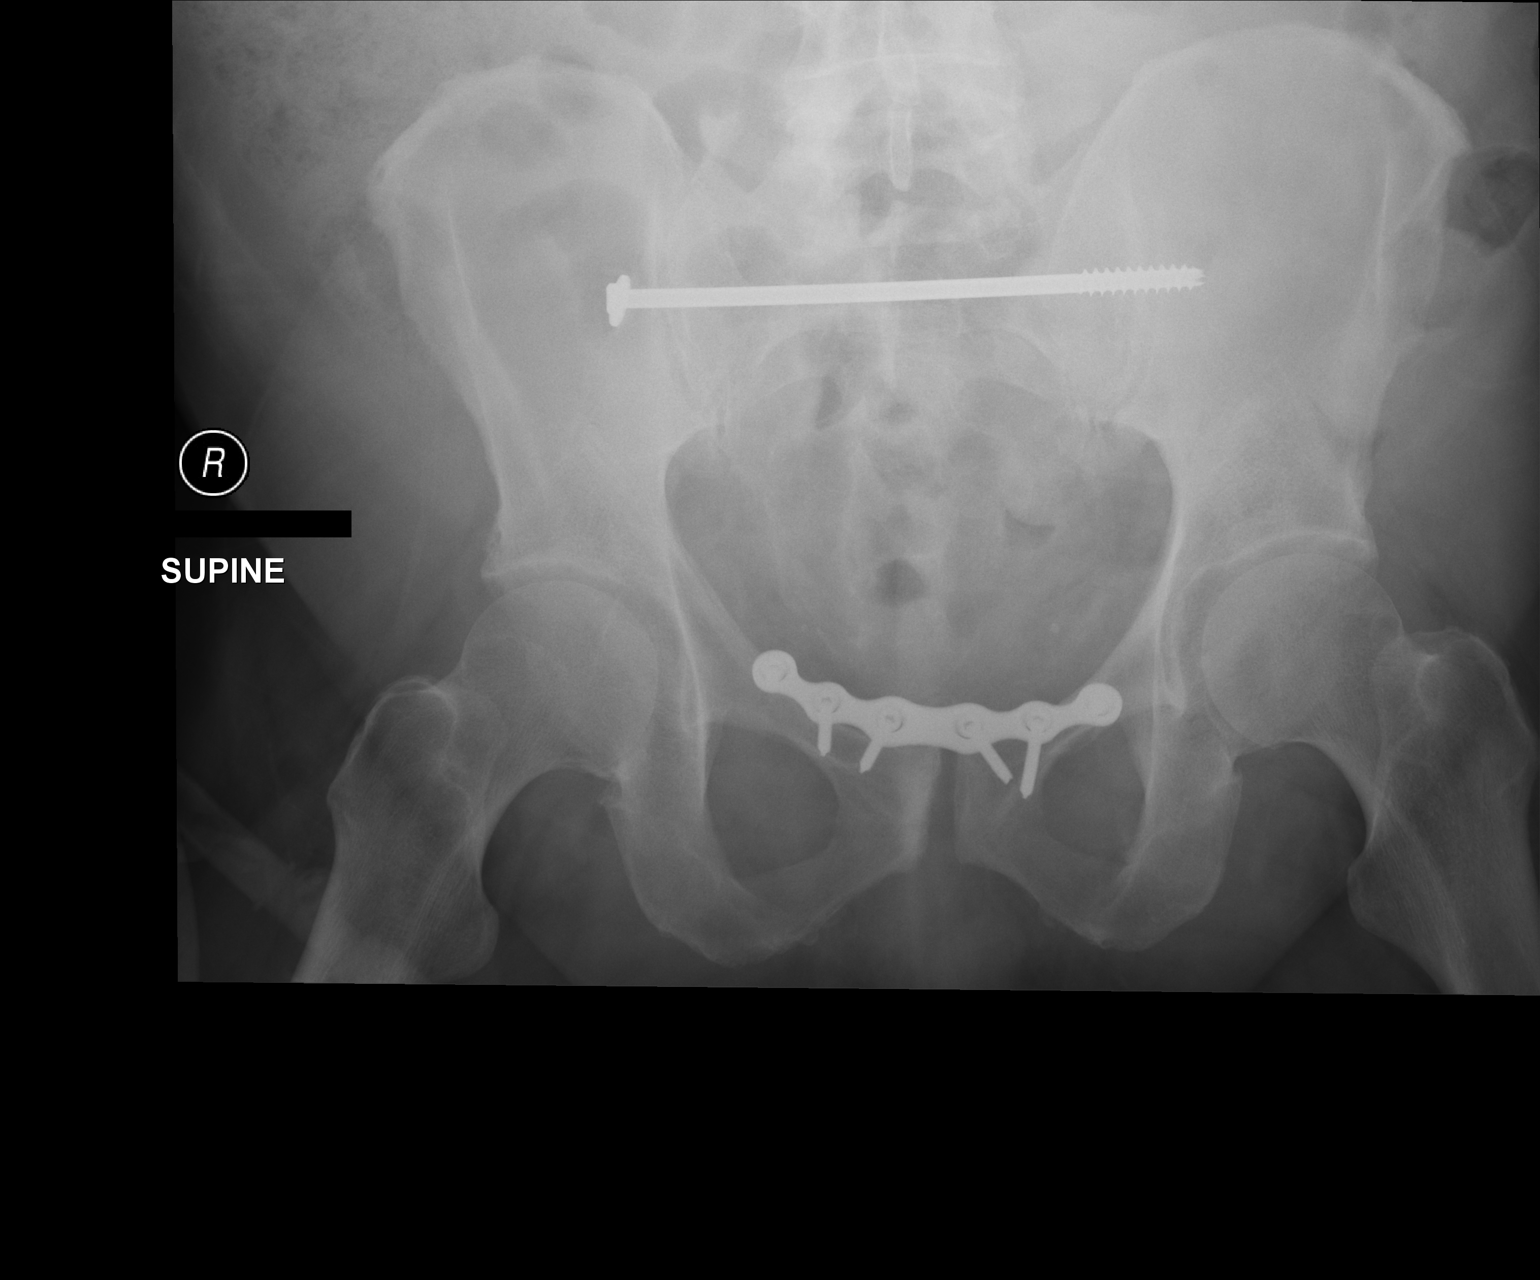

[2 of 2 positions shown; findings below may reference images not displayed]

FINDINGS: Nonobstructive bowel gas pattern. No gaseous distention to suggest
adynamic ileus.

Moderate colonic stool burden.

Postoperative changes involving the superior pubic rami/pubic
symphysis and bilateral sacroiliac joints.
IMPRESSION: Moderate colonic stool burden, raising the possibility of
constipation.

No evidence of bowel obstruction or adynamic ileus.
